# Patient Record
Sex: Female | Born: 1940
Health system: Southern US, Community
[De-identification: ages and names within clinical notes are randomized; demographics above are authoritative.]

## PROBLEM LIST (undated history)

## (undated) DIAGNOSIS — N390 Urinary tract infection, site not specified: Secondary | ICD-10-CM

## (undated) DIAGNOSIS — R5383 Other fatigue: Secondary | ICD-10-CM

## (undated) DIAGNOSIS — E785 Hyperlipidemia, unspecified: Secondary | ICD-10-CM

## (undated) DIAGNOSIS — IMO0001 Reserved for inherently not codable concepts without codable children: Secondary | ICD-10-CM

## (undated) DIAGNOSIS — E119 Type 2 diabetes mellitus without complications: Secondary | ICD-10-CM

## (undated) DIAGNOSIS — I809 Phlebitis and thrombophlebitis of unspecified site: Secondary | ICD-10-CM

## (undated) DIAGNOSIS — N952 Postmenopausal atrophic vaginitis: Secondary | ICD-10-CM

## (undated) DIAGNOSIS — J449 Chronic obstructive pulmonary disease, unspecified: Secondary | ICD-10-CM

## (undated) DIAGNOSIS — J020 Streptococcal pharyngitis: Secondary | ICD-10-CM

## (undated) DIAGNOSIS — R87629 Unspecified abnormal cytological findings in specimens from vagina: Secondary | ICD-10-CM

## (undated) DIAGNOSIS — R42 Dizziness and giddiness: Secondary | ICD-10-CM

## (undated) HISTORY — PX: GLAUCOMA SURGERY: SHX656

## (undated) HISTORY — DX: Unspecified abnormal cytological findings in specimens from vagina: R87.629

## (undated) HISTORY — DX: Other fatigue: R53.83

## (undated) HISTORY — DX: Urinary tract infection, site not specified: N39.0

## (undated) HISTORY — DX: Type 2 diabetes mellitus without complications: E11.9

## (undated) HISTORY — DX: Dizziness and giddiness: R42

## (undated) HISTORY — DX: Chronic obstructive pulmonary disease, unspecified: J44.9

## (undated) HISTORY — DX: Streptococcal pharyngitis: J02.0

## (undated) HISTORY — DX: Postmenopausal atrophic vaginitis: N95.2

## (undated) HISTORY — PX: CERVICAL CONE BIOPSY: SUR198

## (undated) HISTORY — DX: Phlebitis and thrombophlebitis of unspecified site: I80.9

## (undated) HISTORY — DX: Reserved for inherently not codable concepts without codable children: IMO0001

## (undated) HISTORY — DX: Hyperlipidemia, unspecified: E78.5

## (undated) HISTORY — PX: VARICOSE VEIN SURGERY: SHX832

---

## 1999-01-10 ENCOUNTER — Encounter: Payer: Self-pay | Admitting: Vascular Surgery

## 1999-01-14 ENCOUNTER — Ambulatory Visit (HOSPITAL_COMMUNITY): Admission: RE | Admit: 1999-01-14 | Discharge: 1999-01-14 | Payer: Self-pay | Admitting: Vascular Surgery

## 1999-01-14 ENCOUNTER — Encounter (INDEPENDENT_AMBULATORY_CARE_PROVIDER_SITE_OTHER): Payer: Self-pay

## 2000-11-22 ENCOUNTER — Other Ambulatory Visit: Admission: RE | Admit: 2000-11-22 | Discharge: 2000-11-22 | Payer: Self-pay | Admitting: Specialist

## 2003-02-05 ENCOUNTER — Other Ambulatory Visit: Admission: RE | Admit: 2003-02-05 | Discharge: 2003-02-05 | Payer: Self-pay | Admitting: Family Medicine

## 2004-02-08 ENCOUNTER — Other Ambulatory Visit: Admission: RE | Admit: 2004-02-08 | Discharge: 2004-02-08 | Payer: Self-pay | Admitting: Family Medicine

## 2008-06-22 DIAGNOSIS — J449 Chronic obstructive pulmonary disease, unspecified: Secondary | ICD-10-CM

## 2008-06-22 HISTORY — DX: Chronic obstructive pulmonary disease, unspecified: J44.9

## 2009-01-13 HISTORY — PX: OTHER SURGICAL HISTORY: SHX169

## 2009-01-17 ENCOUNTER — Ambulatory Visit (HOSPITAL_COMMUNITY): Admission: RE | Admit: 2009-01-17 | Discharge: 2009-01-17 | Payer: Self-pay | Admitting: Ophthalmology

## 2009-01-29 HISTORY — PX: OTHER SURGICAL HISTORY: SHX169

## 2009-02-07 ENCOUNTER — Ambulatory Visit (HOSPITAL_COMMUNITY): Admission: RE | Admit: 2009-02-07 | Discharge: 2009-02-07 | Payer: Self-pay | Admitting: Ophthalmology

## 2010-09-28 LAB — BASIC METABOLIC PANEL
BUN: 16 mg/dL (ref 6–23)
CO2: 29 mEq/L (ref 19–32)
Calcium: 9.5 mg/dL (ref 8.4–10.5)
Chloride: 109 mEq/L (ref 96–112)
Creatinine, Ser: 0.8 mg/dL (ref 0.4–1.2)
GFR calc Af Amer: >60 mL/min (ref >60)
GFR calc non Af Amer: >60 mL/min (ref >60)
Glucose, Bld: 107 mg/dL — ABNORMAL HIGH (ref 70–99)
Potassium: 4.6 mEq/L (ref 3.5–5.1)
Sodium: 142 mEq/L (ref 135–145)

## 2010-09-28 LAB — HEMOGLOBIN AND HEMATOCRIT, BLOOD
HCT: 37 % (ref 36.0–46.0)
Hemoglobin: 12.8 g/dL (ref 12.0–15.0)

## 2010-10-21 ENCOUNTER — Ambulatory Visit (INDEPENDENT_AMBULATORY_CARE_PROVIDER_SITE_OTHER): Payer: Medicare Other | Admitting: Urology

## 2010-10-21 DIAGNOSIS — N952 Postmenopausal atrophic vaginitis: Secondary | ICD-10-CM

## 2010-10-21 DIAGNOSIS — N3000 Acute cystitis without hematuria: Secondary | ICD-10-CM

## 2010-10-21 DIAGNOSIS — R3915 Urgency of urination: Secondary | ICD-10-CM

## 2010-10-21 DIAGNOSIS — N3941 Urge incontinence: Secondary | ICD-10-CM

## 2010-11-11 ENCOUNTER — Encounter: Payer: Self-pay | Admitting: Nurse Practitioner

## 2012-09-30 ENCOUNTER — Ambulatory Visit (INDEPENDENT_AMBULATORY_CARE_PROVIDER_SITE_OTHER): Payer: Medicare Other | Admitting: Nurse Practitioner

## 2012-09-30 ENCOUNTER — Encounter: Payer: Self-pay | Admitting: Nurse Practitioner

## 2012-09-30 VITALS — BP 137/77 | HR 50 | Temp 96.8°F | Ht 67.5 in | Wt 148.5 lb

## 2012-09-30 DIAGNOSIS — H409 Unspecified glaucoma: Secondary | ICD-10-CM

## 2012-09-30 DIAGNOSIS — E559 Vitamin D deficiency, unspecified: Secondary | ICD-10-CM

## 2012-09-30 DIAGNOSIS — E785 Hyperlipidemia, unspecified: Secondary | ICD-10-CM

## 2012-09-30 DIAGNOSIS — I10 Essential (primary) hypertension: Secondary | ICD-10-CM | POA: Insufficient documentation

## 2012-09-30 LAB — COMPLETE METABOLIC PANEL WITH GFR
ALT: 12 U/L (ref 0–35)
BUN: 11 mg/dL (ref 6–23)
CO2: 28 mEq/L (ref 19–32)
Creat: 0.72 mg/dL (ref 0.50–1.10)
GFR, Est African American: >89 mL/min
GFR, Est Non African American: 85 mL/min
Total Bilirubin: 0.4 mg/dL (ref 0.3–1.2)

## 2012-09-30 NOTE — Progress Notes (Signed)
Subjective:    Patient ID: Priscilla Daniels, female    DOB: 09-20-1940, 71 y.o.   MRN: [id number]  Hyperlipidemia This is a chronic problem. The current episode started more than 1 year ago. The problem is controlled. Recent lipid tests were reviewed and are normal. She has no history of diabetes, hypothyroidism or obesity. There are no known factors aggravating her hyperlipidemia. Pertinent negatives include no chest pain, leg pain or shortness of breath. Current antihyperlipidemic treatment includes statins. The current treatment provides no improvement of lipids. Compliance problems include adherence to diet.  Risk factors for coronary artery disease include dyslipidemia, family history, hypertension and post-menopausal.  Hypertension This is a chronic problem. The current episode started more than 1 year ago. The problem has been resolved since onset. The problem is controlled. Associated symptoms include blurred vision. Pertinent negatives include no anxiety, chest pain, palpitations, peripheral edema or shortness of breath. There are no associated agents to hypertension. Risk factors for coronary artery disease include dyslipidemia, family history and post-menopausal state. Past treatments include ACE inhibitors. The current treatment provides no improvement. Compliance problems include exercise.  There is no history of kidney disease or a thyroid problem. There is no history of sleep apnea.      Review of Systems  Constitutional: Negative.   HENT: Negative.   Eyes: Positive for blurred vision.  Respiratory: Negative.  Negative for cough and shortness of breath.   Cardiovascular: Negative.  Negative for chest pain and palpitations.  Gastrointestinal: Negative.   Endocrine: Negative.   Genitourinary:       Pt states she "feels pressure" and doesn't feel like she urinating enough  Musculoskeletal: Negative.   Skin: Negative.   Allergic/Immunologic: Positive for environmental allergies.   Hematological: Negative.   Psychiatric/Behavioral: Negative.        Objective:   Physical Exam  Constitutional: She is oriented to person, place, and time. She appears well-developed and well-nourished.  HENT:  Nose: Nose normal.  Mouth/Throat: Oropharynx is clear and moist.  Eyes: EOM are normal.  Neck: Trachea normal, normal range of motion and full passive range of motion without pain. Neck supple. No JVD present. Carotid bruit is not present. No thyromegaly present.  Cardiovascular: Normal rate, regular rhythm, normal heart sounds and intact distal pulses.  Exam reveals no gallop and no friction rub.   No murmur heard. Pulmonary/Chest: Effort normal and breath sounds normal.  Abdominal: Soft. Bowel sounds are normal. She exhibits no distension and no mass. There is no tenderness.  Musculoskeletal: Normal range of motion.  Lymphadenopathy:    She has no cervical adenopathy.  Neurological: She is alert and oriented to person, place, and time. She has normal reflexes.  Skin: Skin is warm and dry.  Psychiatric: She has a normal mood and affect. Her behavior is normal. Judgment and thought content normal.     BP 137/77  Pulse 50  Temp(Src) 96.8 F (36 C) (Oral)  Ht 5' 7.5" (1.715 m)  Wt 148 lb 8 oz (67.359 kg)  BMI 22.9 kg/m2      Assessment & Plan:  1. Glaucoma (increased eye pressure) -Keep f/u with Eye dr -Continue Meds  2. Other and unspecified hyperlipidemia -Encourage low fat diet - NMR Lipoprofile with Lipids  3. Unspecified essential hypertension -Encourage Exercise - COMPLETE METABOLIC PANEL WITH GFR  4. Unspecified vitamin D deficiency -Labs pending - Vitamin D 25 hydroxy  Health Maintenance Discussed and Hemocult cards given Labs Pending Continue all current meds  Mary-Margaret  Hassell Done, [place]

## 2012-09-30 NOTE — Patient Instructions (Signed)

## 2012-10-01 LAB — VITAMIN D 25 HYDROXY (VIT D DEFICIENCY, FRACTURES): Vit D, 25-Hydroxy: 26 ng/mL — ABNORMAL LOW (ref 30–89)

## 2012-10-04 LAB — NMR LIPOPROFILE WITH LIPIDS
HDL Size: 10.2 nm (ref >=9.2)
HDL-C: 75 mg/dL (ref >=40)
LDL (calc): 96 mg/dL (ref <100)
LDL Particle Number: 1278 nmol/L — ABNORMAL HIGH (ref <1000)
LDL Size: 21.4 nm (ref >20.5)
LP-IR Score: <25 (ref <=45)
VLDL Size: 39.5 nm (ref <=46.6)

## 2012-10-17 ENCOUNTER — Telehealth: Payer: Self-pay | Admitting: Nurse Practitioner

## 2012-10-17 NOTE — Telephone Encounter (Signed)
i cant see where labs were reviewed on the 09/30/12 can you please check and send back to pool be

## 2012-10-17 NOTE — Telephone Encounter (Signed)
Patient aware of labs.  

## 2012-10-17 NOTE — Telephone Encounter (Signed)
Let patient know all labs looked OK . Continue current meds Recheck in 3 months

## 2012-11-09 ENCOUNTER — Telehealth: Payer: Self-pay | Admitting: Nurse Practitioner

## 2012-11-09 NOTE — Telephone Encounter (Signed)
Copy of labs sent to pt. 

## 2012-11-09 NOTE — Telephone Encounter (Signed)
Will you please review labs from 09/2012 so we can call and send pt a copy

## 2012-11-09 NOTE — Telephone Encounter (Signed)
AL labs look ok- Continue current meds and recheck in 3 months

## 2012-11-25 ENCOUNTER — Ambulatory Visit (INDEPENDENT_AMBULATORY_CARE_PROVIDER_SITE_OTHER): Payer: Medicare Other | Admitting: Physician Assistant

## 2012-11-25 VITALS — BP 150/80 | HR 78 | Temp 97.9°F | Ht 67.5 in | Wt 148.0 lb

## 2012-11-25 DIAGNOSIS — W57XXXA Bitten or stung by nonvenomous insect and other nonvenomous arthropods, initial encounter: Secondary | ICD-10-CM

## 2012-11-25 DIAGNOSIS — S30861A Insect bite (nonvenomous) of abdominal wall, initial encounter: Secondary | ICD-10-CM

## 2012-11-25 DIAGNOSIS — S40269A Insect bite (nonvenomous) of unspecified shoulder, initial encounter: Secondary | ICD-10-CM

## 2012-11-25 DIAGNOSIS — S40862A Insect bite (nonvenomous) of left upper arm, initial encounter: Secondary | ICD-10-CM

## 2012-11-25 MED ORDER — DOXYCYCLINE HYCLATE 100 MG PO TABS: 100.0000 mg | ORAL_TABLET | Freq: Two times a day (BID) | ORAL | Status: DC

## 2012-11-25 NOTE — Patient Instructions (Addendum)
Take antibiotic with food. Do not take on empty stomach. Finish all of antibiotic. Clean area daily with soap and water and cover with Vaseline. If patient develops rash, headache, nausea, vomiting, return to clinic.

## 2012-11-25 NOTE — Progress Notes (Signed)
  Subjective:    Patient ID: Priscilla Daniels, female    DOB: 1940/09/25, 72 y.o.   MRN: [id number]  HPI 72 y/o female was bitten by a tick yesterday and was unable to remove all of it.     Review of Systems  Constitutional: Negative for fever, chills, diaphoresis, appetite change and fatigue.  Respiratory: Negative for shortness of breath.   Cardiovascular: Negative for chest pain and palpitations.  Skin: Negative for color change, pallor, rash and wound.  Neurological: Negative for dizziness, light-headedness, numbness and headaches.       Objective:   Physical Exam  Constitutional: She appears well-developed and well-nourished.  HENT:  Head: Normocephalic.  Eyes: Pupils are equal, round, and reactive to light.  Abdominal: Soft. There is no tenderness. There is no rebound and no guarding.  Lymphadenopathy:    She has no cervical adenopathy.  Skin: No rash noted. She is not diaphoretic. There is erythema (surrounding tick bite). No pallor.  Psychiatric: She has a normal mood and affect. Her behavior is normal.          Assessment & Plan:  Remainder of tick was removed from abdomen. No further reminants noted on exam. Lesion of bite is erythematous but does not appear to be infected at this time. Due to patient concerns about RMSF and Lymes Disease, I have decided to treat prophylacticly with Doxycycline 100 Mg BID x 14 days. I instructed her of s/s to watch for including rash, n/v, HA, fever and RTC if any of these symptoms present.

## 2013-01-24 ENCOUNTER — Ambulatory Visit (INDEPENDENT_AMBULATORY_CARE_PROVIDER_SITE_OTHER): Payer: Medicare Other | Admitting: Nurse Practitioner

## 2013-01-24 ENCOUNTER — Ambulatory Visit (INDEPENDENT_AMBULATORY_CARE_PROVIDER_SITE_OTHER): Payer: Medicare Other

## 2013-01-24 ENCOUNTER — Encounter: Payer: Self-pay | Admitting: Nurse Practitioner

## 2013-01-24 VITALS — BP 136/77 | HR 59 | Temp 96.9°F | Ht 67.5 in | Wt 138.0 lb

## 2013-01-24 DIAGNOSIS — R634 Abnormal weight loss: Secondary | ICD-10-CM

## 2013-01-24 DIAGNOSIS — E785 Hyperlipidemia, unspecified: Secondary | ICD-10-CM

## 2013-01-24 DIAGNOSIS — R34 Anuria and oliguria: Secondary | ICD-10-CM

## 2013-01-24 DIAGNOSIS — I10 Essential (primary) hypertension: Secondary | ICD-10-CM

## 2013-01-24 LAB — POCT UA - MICROSCOPIC ONLY
Bacteria, U Microscopic: NEGATIVE
Casts, Ur, LPF, POC: NEGATIVE
Crystals, Ur, HPF, POC: NEGATIVE
Mucus, UA: NEGATIVE
Yeast, UA: NEGATIVE

## 2013-01-24 LAB — POCT URINALYSIS DIPSTICK
Bilirubin, UA: NEGATIVE
Glucose, UA: NEGATIVE
Ketones, UA: NEGATIVE
Nitrite, UA: NEGATIVE
Protein, UA: NEGATIVE
Spec Grav, UA: >=1.03
Urobilinogen, UA: NEGATIVE
pH, UA: 5

## 2013-01-24 IMAGING — CR DG CHEST 2V
2 series · 2 of 2 positions shown · non-contrast
Comparison: None.

CLINICAL DATA: Weight loss

CHEST - 2 VIEW

[view not recorded (1 of 2)]
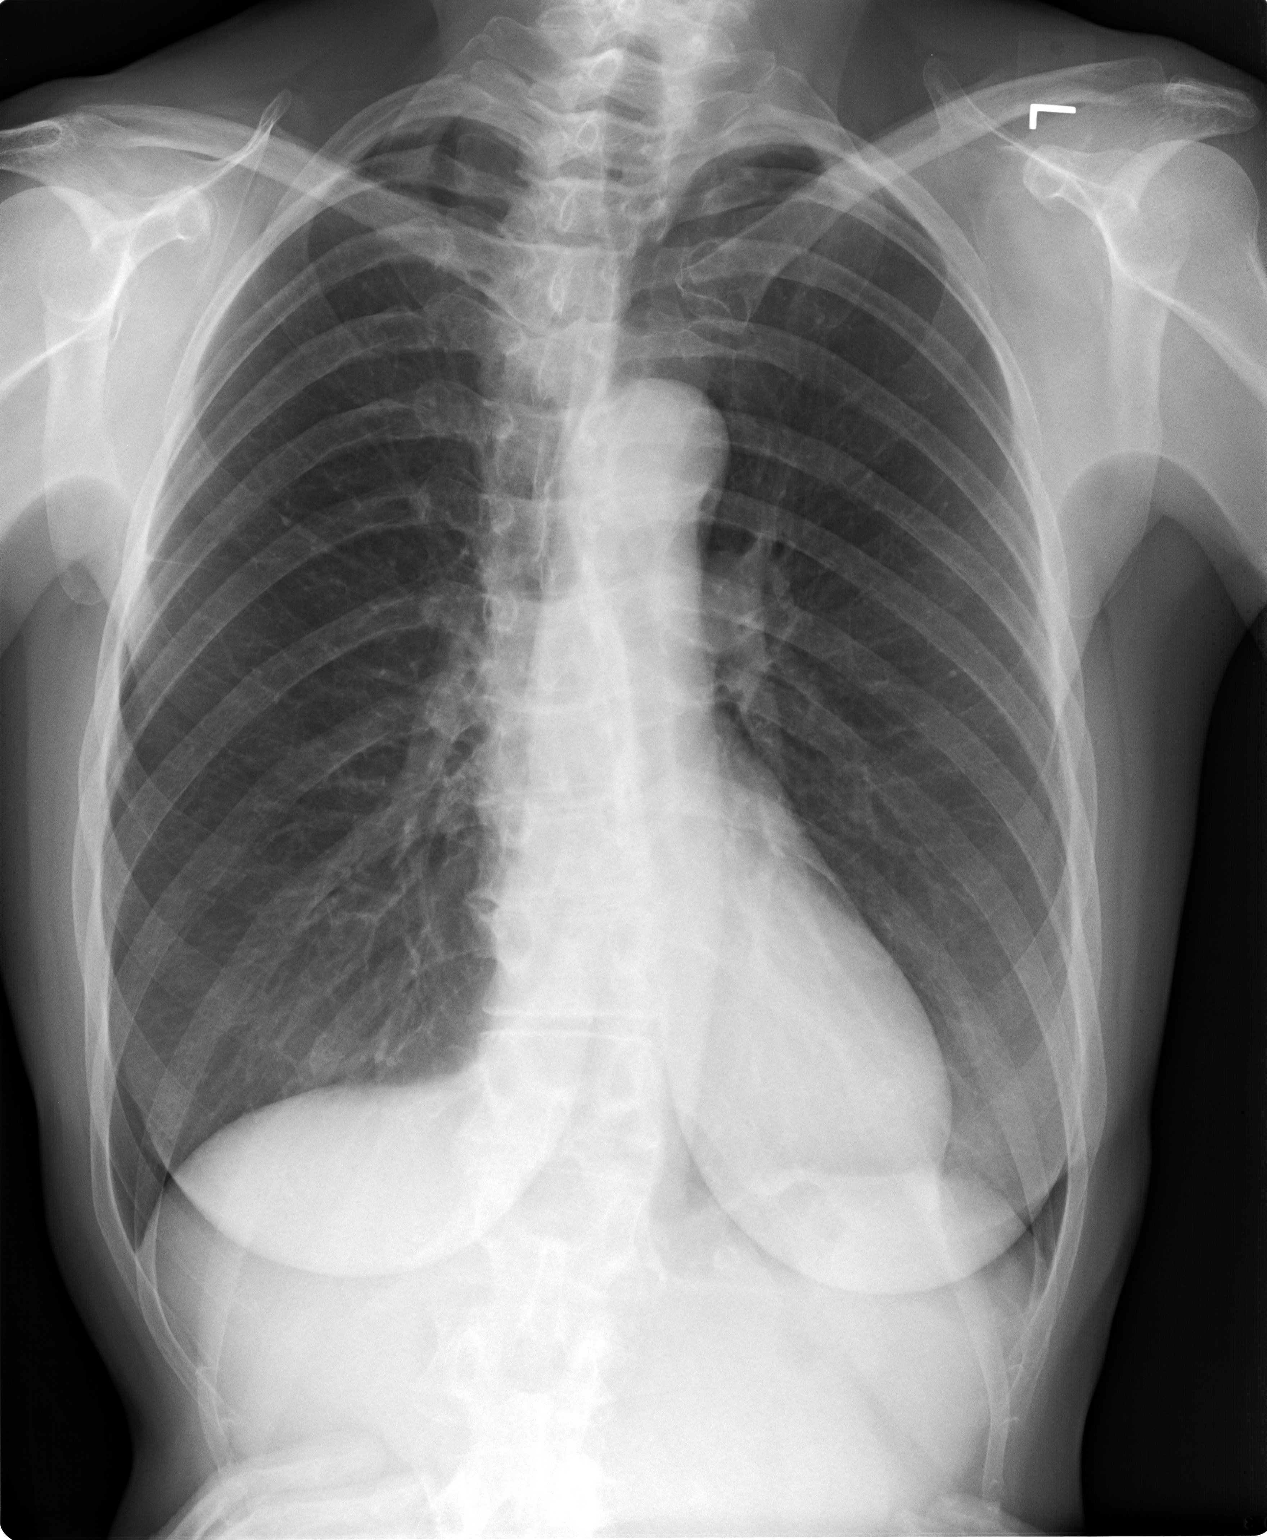

[view not recorded (2 of 2)]
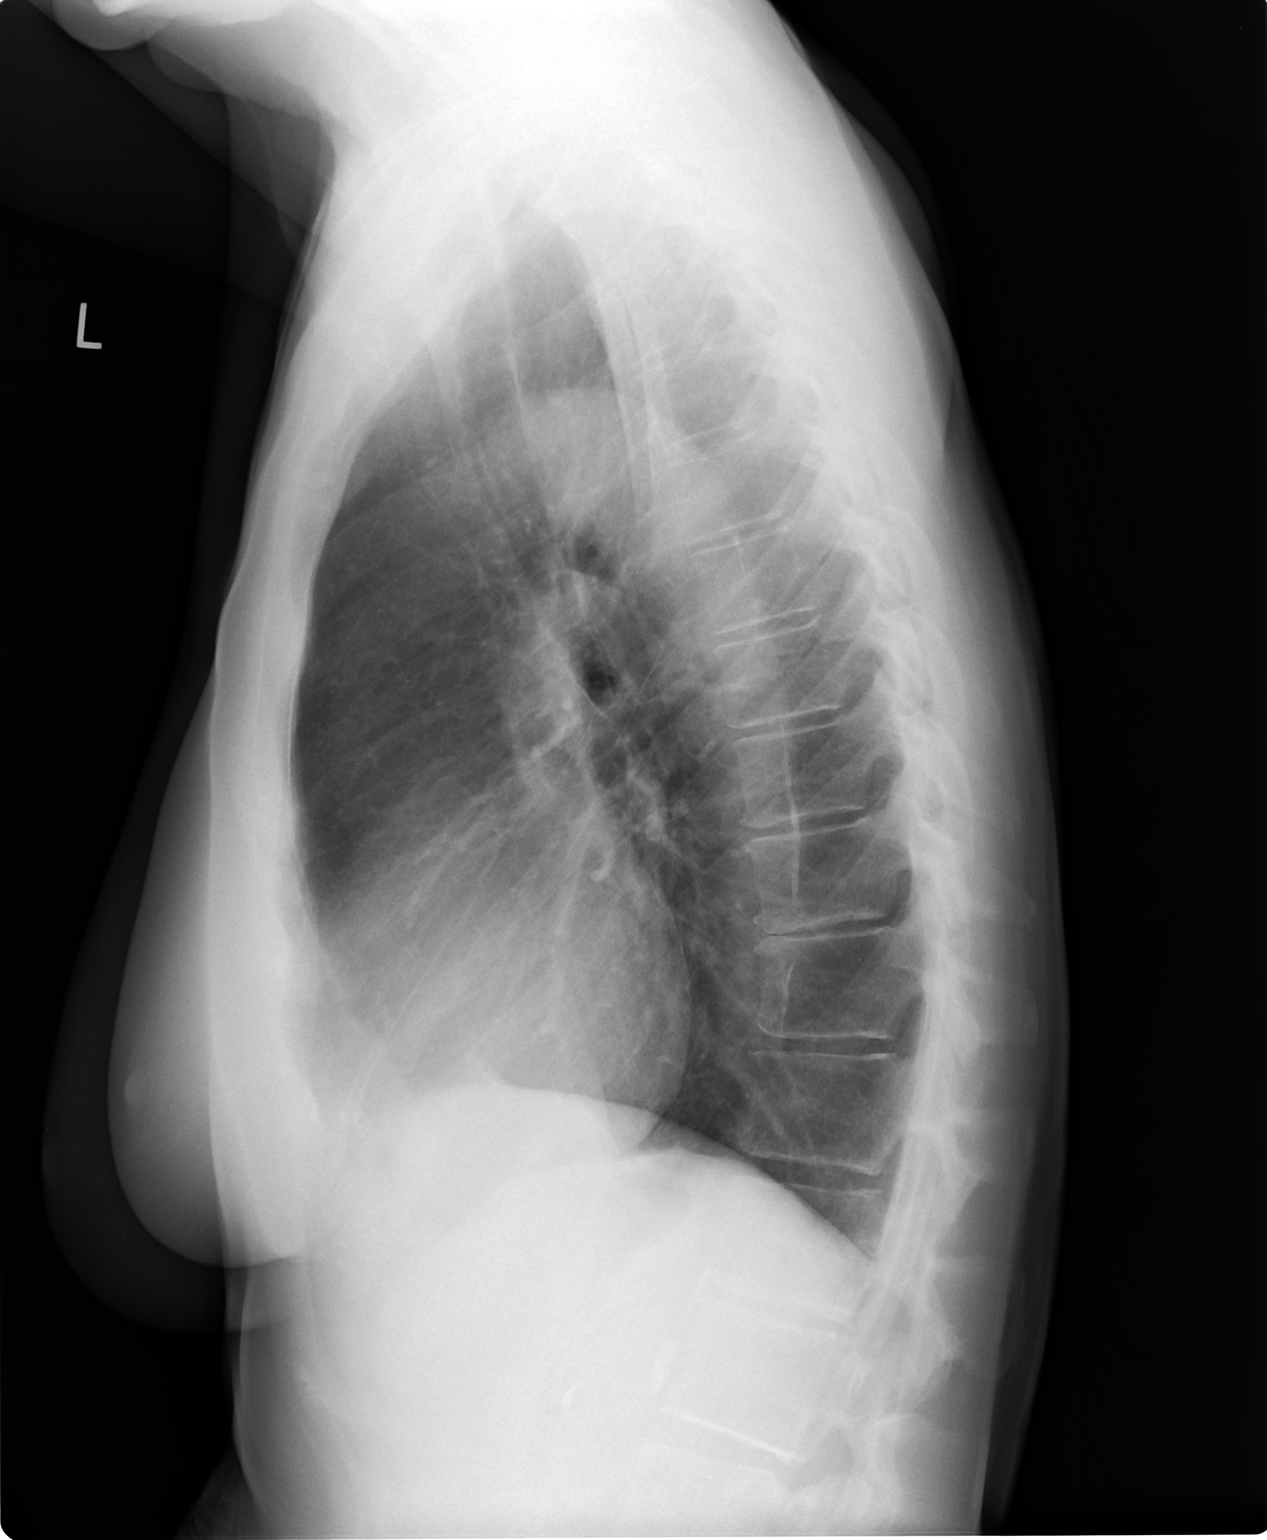

[2 of 2 positions shown; findings below may reference images not displayed]

FINDINGS: Cardiomediastinal silhouette is unremarkable.  Mild
thoracic dextroscoliosis.  Mild degenerative changes lower thoracic
spine.  No acute infiltrate or pulmonary edema.
IMPRESSION: No active disease.  Mild thoracic dextroscoliosis.  Mild
degenerative changes lower thoracic spine.

Clinically significant discrepancy from primary report, if
provided: None

## 2013-01-24 MED ORDER — LISINOPRIL-HYDROCHLOROTHIAZIDE 20-12.5 MG PO TABS: 1.0000 | ORAL_TABLET | Freq: Every day | ORAL | Status: DC

## 2013-01-24 MED ORDER — ROSUVASTATIN CALCIUM 10 MG PO TABS: 10.0000 mg | ORAL_TABLET | Freq: Every day | ORAL | Status: DC

## 2013-01-24 NOTE — Progress Notes (Signed)
Subjective:    Patient ID: Priscilla Daniels, female    DOB: 03-15-1941, 72 y.o.   MRN: [id number]  Hypertension This is a chronic problem. The current episode started more than 1 year ago. The problem is controlled. Pertinent negatives include no anxiety, blurred vision, palpitations or peripheral edema. Risk factors for coronary artery disease include dyslipidemia, post-menopausal state and family history. Past treatments include diuretics and ACE inhibitors. The current treatment provides moderate improvement. There is no history of a thyroid problem. There is no history of sleep apnea.  Hyperlipidemia This is a chronic problem. The current episode started more than 1 year ago. The problem is controlled. Recent lipid tests were reviewed and are normal. She has no history of diabetes. Associated symptoms include leg pain. Pertinent negatives include no myalgias. Current antihyperlipidemic treatment includes statins. The current treatment provides significant improvement of lipids. Risk factors for coronary artery disease include post-menopausal, hypertension and family history.  Urinary Tract Infection  This is a recurrent problem. The current episode started 1 to 4 weeks ago (about two weeks ago). The problem occurs intermittently. The problem has been unchanged. The quality of the pain is described as burning. The pain is at a severity of 2/10. The pain is mild. There has been no fever. Associated symptoms include frequency and urgency.      Review of Systems  Eyes: Negative for blurred vision.  Cardiovascular: Negative for palpitations.  Genitourinary: Positive for urgency and frequency.  Musculoskeletal: Negative for myalgias.  All other systems reviewed and are negative.       Objective:   Physical Exam  Vitals reviewed. Constitutional: She is oriented to person, place, and time. She appears well-developed and well-nourished.  HENT:  Head: Normocephalic.  Right Ear: External ear  normal.  Left Ear: External ear normal.  Mouth/Throat: Oropharynx is clear and moist.  Eyes: Pupils are equal, round, and reactive to light.  Neck: Normal range of motion. Neck supple. No thyromegaly present.  Cardiovascular: Normal rate, regular rhythm, normal heart sounds and intact distal pulses.   Pulmonary/Chest: Effort normal and breath sounds normal. No respiratory distress.  Abdominal: Soft. Bowel sounds are normal. She exhibits no distension. There is no tenderness.  Musculoskeletal: Normal range of motion. She exhibits no edema.  Neurological: She is alert and oriented to person, place, and time.  Skin: Skin is warm and dry.  Psychiatric: She has a normal mood and affect. Her behavior is normal. Judgment and thought content normal.     BP 136/77  Pulse 59  Temp(Src) 96.9 F (36.1 C) (Oral)  Ht 5' 7.5" (1.715 m)  Wt 138 lb (62.596 kg)  BMI 21.28 kg/m2 Results for orders placed in visit on 01/24/13  POCT UA - MICROSCOPIC ONLY      Result Value Range   WBC, Ur, HPF, POC 5-10     RBC, urine, microscopic 1-5     Bacteria, U Microscopic neg     Mucus, UA neg     Epithelial cells, urine per micros occ     Crystals, Ur, HPF, POC neg     Casts, Ur, LPF, POC neg     Yeast, UA neg    POCT URINALYSIS DIPSTICK      Result Value Range   Color, UA yellow     Clarity, UA clear     Glucose, UA neg     Bilirubin, UA neg     Ketones, UA neg     Spec Grav, UA >=1.030  Blood, UA trace     pH, UA 5.0     Protein, UA neg     Urobilinogen, UA negative     Nitrite, UA neg     Leukocytes, UA Trace     Chest xray- No acute findings-Preliminary reading by Paulene Floor, FNP  [facility]     Assessment & Plan:  1. Urine output low Force fluids - POCT UA - Microscopic Only - POCT urinalysis dipstick  2. Hypertension Low Na+ diet - lisinopril-hydrochlorothiazide (PRINZIDE,ZESTORETIC) 20-12.5 MG per tablet; Take 1 tablet by mouth daily.  Dispense: 30 tablet; Refill: 5 -  CMP14+EGFR  3. Hyperlipidemia Low fat diet - rosuvastatin (CRESTOR) 10 MG tablet; Take 1 tablet (10 mg total) by mouth daily.  Dispense: 30 tablet; Refill: 5 - NMR, lipoprofile  4. Weight loss Add boost or ensure to daily diet If weight continues to go down then need to be seen for pelvic exam  Health Maintenance discussed  Mary-Margaret Daphine Deutscher, FNP

## 2013-01-24 NOTE — Patient Instructions (Addendum)

## 2013-01-26 ENCOUNTER — Other Ambulatory Visit: Payer: Self-pay | Admitting: Nurse Practitioner

## 2013-01-26 LAB — CMP14+EGFR
ALT: 14 IU/L (ref 0–32)
AST: 19 IU/L (ref 0–40)
Albumin/Globulin Ratio: 1.7 (ref 1.1–2.5)
Alkaline Phosphatase: 55 IU/L (ref 39–117)
BUN/Creatinine Ratio: 17 (ref 11–26)
GFR calc Af Amer: 84 mL/min/{1.73_m2} (ref >59)
GFR calc non Af Amer: 73 mL/min/{1.73_m2} (ref >59)
Potassium: 5.5 mmol/L — ABNORMAL HIGH (ref 3.5–5.2)
Sodium: 141 mmol/L (ref 134–144)
Total Bilirubin: 0.3 mg/dL (ref 0.0–1.2)
Total Protein: 6.8 g/dL (ref 6.0–8.5)

## 2013-01-26 LAB — NMR, LIPOPROFILE
LDL Size: 21.3 nm (ref >20.5)
LP-IR Score: <25 (ref <=45)
Small LDL Particle Number: 150 nmol/L (ref <=527)
Triglycerides by NMR: 108 mg/dL (ref <150)

## 2013-01-26 MED ORDER — ROSUVASTATIN CALCIUM 20 MG PO TABS: 20.0000 mg | ORAL_TABLET | Freq: Every day | ORAL | Status: DC

## 2013-03-27 ENCOUNTER — Ambulatory Visit (INDEPENDENT_AMBULATORY_CARE_PROVIDER_SITE_OTHER): Payer: Medicare Other

## 2013-03-27 DIAGNOSIS — Z23 Encounter for immunization: Secondary | ICD-10-CM

## 2013-05-01 ENCOUNTER — Ambulatory Visit (INDEPENDENT_AMBULATORY_CARE_PROVIDER_SITE_OTHER): Payer: Medicare Other | Admitting: Nurse Practitioner

## 2013-05-01 ENCOUNTER — Encounter: Payer: Self-pay | Admitting: Nurse Practitioner

## 2013-05-01 VITALS — BP 142/84 | HR 66 | Temp 96.8°F | Ht 67.5 in | Wt 138.0 lb

## 2013-05-01 DIAGNOSIS — I10 Essential (primary) hypertension: Secondary | ICD-10-CM

## 2013-05-01 DIAGNOSIS — E785 Hyperlipidemia, unspecified: Secondary | ICD-10-CM

## 2013-05-01 DIAGNOSIS — R350 Frequency of micturition: Secondary | ICD-10-CM

## 2013-05-01 DIAGNOSIS — H409 Unspecified glaucoma: Secondary | ICD-10-CM

## 2013-05-01 LAB — POCT UA - MICROSCOPIC ONLY
Crystals, Ur, HPF, POC: NEGATIVE
Yeast, UA: NEGATIVE

## 2013-05-01 LAB — POCT URINALYSIS DIPSTICK
Protein, UA: NEGATIVE
Spec Grav, UA: <=1.005
Urobilinogen, UA: NEGATIVE

## 2013-05-01 MED ORDER — ROSUVASTATIN CALCIUM 20 MG PO TABS: 20.0000 mg | ORAL_TABLET | Freq: Every day | ORAL | Status: DC

## 2013-05-01 MED ORDER — LISINOPRIL-HYDROCHLOROTHIAZIDE 20-12.5 MG PO TABS: 1.0000 | ORAL_TABLET | Freq: Every day | ORAL | Status: DC

## 2013-05-01 NOTE — Patient Instructions (Signed)

## 2013-05-01 NOTE — Progress Notes (Signed)
Subjective:    Patient ID: Priscilla Daniels, female    DOB: 1940/11/12, 72 y.o.   MRN: [id number]  Hyperlipidemia This is a chronic problem. The current episode started more than 1 year ago. The problem is controlled. Recent lipid tests were reviewed and are normal. She has no history of diabetes, hypothyroidism or obesity. There are no known factors aggravating her hyperlipidemia. Pertinent negatives include no chest pain, leg pain or shortness of breath. Current antihyperlipidemic treatment includes statins. The current treatment provides no improvement of lipids. Compliance problems include adherence to diet.  Risk factors for coronary artery disease include dyslipidemia, family history, hypertension and post-menopausal.  Hypertension This is a chronic problem. The current episode started more than 1 year ago. The problem has been resolved since onset. The problem is controlled. Associated symptoms include blurred vision. Pertinent negatives include no anxiety, chest pain, palpitations, peripheral edema or shortness of breath. There are no associated agents to hypertension. Risk factors for coronary artery disease include dyslipidemia, family history and post-menopausal state. Past treatments include ACE inhibitors. The current treatment provides no improvement. Compliance problems include exercise.  There is no history of kidney disease or a thyroid problem. There is no history of sleep apnea.  Glaucoma Pressure currently under control- says that dr. Not sure glaucoma- but a pressure behind her eye.  * C/o urinary frequency and urgency- started about 1 week ago    Review of Systems  Constitutional: Negative.   HENT: Negative.   Eyes: Positive for blurred vision.  Respiratory: Negative.  Negative for cough and shortness of breath.   Cardiovascular: Negative.  Negative for chest pain and palpitations.  Gastrointestinal: Negative.   Endocrine: Negative.   Genitourinary:       Pt states she  "feels pressure" and doesn't feel like she urinating enough  Musculoskeletal: Negative.   Skin: Negative.   Allergic/Immunologic: Positive for environmental allergies.  Hematological: Negative.   Psychiatric/Behavioral: Negative.        Objective:   Physical Exam  Constitutional: She is oriented to person, place, and time. She appears well-developed and well-nourished.  HENT:  Nose: Nose normal.  Mouth/Throat: Oropharynx is clear and moist.  Eyes: EOM are normal.  Neck: Trachea normal, normal range of motion and full passive range of motion without pain. Neck supple. No JVD present. Carotid bruit is not present. No thyromegaly present.  Cardiovascular: Normal rate, regular rhythm, normal heart sounds and intact distal pulses.  Exam reveals no gallop and no friction rub.   No murmur heard. Pulmonary/Chest: Effort normal and breath sounds normal.  Abdominal: Soft. Bowel sounds are normal. She exhibits no distension and no mass. There is no tenderness.  Musculoskeletal: Normal range of motion.  Lymphadenopathy:    She has no cervical adenopathy.  Neurological: She is alert and oriented to person, place, and time. She has normal reflexes.  Skin: Skin is warm and dry.  Psychiatric: She has a normal mood and affect. Her behavior is normal. Judgment and thought content normal.     BP 142/84  Pulse 66  Temp(Src) 96.8 F (36 C) (Oral)  Ht 5' 7.5" (1.715 m)  Wt 138 lb (62.596 kg)  BMI 21.28 kg/m2 Urine negative     Assessment & Plan:   1. Urinary frequency   2. Unspecified essential hypertension   3. Other and unspecified hyperlipidemia   4. Glaucoma (increased eye pressure)    Orders Placed This Encounter  Procedures  . CMP14+EGFR  . NMR, lipoprofile  .  POCT urinalysis dipstick  . POCT UA - Microscopic Only   Meds ordered this encounter  Medications  . rosuvastatin (CRESTOR) 20 MG tablet    Sig: Take 1 tablet (20 mg total) by mouth daily.    Dispense:  30 tablet     Refill:  5    Order Specific Question:  Supervising Provider    Answer:  Ernestina Penna [[id number]]  . lisinopril-hydrochlorothiazide (PRINZIDE,ZESTORETIC) 20-12.5 MG per tablet    Sig: Take 1 tablet by mouth daily.    Dispense:  30 tablet    Refill:  5    Order Specific Question:  Supervising Provider    Answer:  Deborra Medina    Continue all meds Labs pending Diet and exercise encouraged Health maintenance reviewed Follow up in 3 months  Mary-Margaret Daphine Deutscher, FNP

## 2013-05-02 LAB — CMP14+EGFR
ALT: 10 IU/L (ref 0–32)
Albumin/Globulin Ratio: 1.5 (ref 1.1–2.5)
Albumin: 4 g/dL (ref 3.5–4.8)
Alkaline Phosphatase: 59 IU/L (ref 39–117)
BUN/Creatinine Ratio: 24 (ref 11–26)
Calcium: 9.4 mg/dL (ref 8.6–10.2)
Creatinine, Ser: 0.7 mg/dL (ref 0.57–1.00)
GFR calc Af Amer: 100 mL/min/{1.73_m2} (ref >59)
GFR calc non Af Amer: 87 mL/min/{1.73_m2} (ref >59)
Globulin, Total: 2.7 g/dL (ref 1.5–4.5)
Glucose: 114 mg/dL — ABNORMAL HIGH (ref 65–99)
Potassium: 4.6 mmol/L (ref 3.5–5.2)
Total Bilirubin: 0.3 mg/dL (ref 0.0–1.2)
Total Protein: 6.7 g/dL (ref 6.0–8.5)

## 2013-05-02 LAB — LIPID PANEL
Chol/HDL Ratio: 2.6 ratio units (ref 0.0–4.4)
HDL: 84 mg/dL (ref >39)
LDL Calculated: 111 mg/dL — ABNORMAL HIGH (ref 0–99)
VLDL Cholesterol Cal: 26 mg/dL (ref 5–40)

## 2013-08-02 ENCOUNTER — Ambulatory Visit (INDEPENDENT_AMBULATORY_CARE_PROVIDER_SITE_OTHER): Payer: Medicare Other | Admitting: Nurse Practitioner

## 2013-08-02 ENCOUNTER — Encounter: Payer: Self-pay | Admitting: Nurse Practitioner

## 2013-08-02 VITALS — BP 158/90 | HR 68 | Temp 97.0°F | Ht 67.0 in | Wt 140.0 lb

## 2013-08-02 DIAGNOSIS — E119 Type 2 diabetes mellitus without complications: Secondary | ICD-10-CM

## 2013-08-02 DIAGNOSIS — E785 Hyperlipidemia, unspecified: Secondary | ICD-10-CM

## 2013-08-02 DIAGNOSIS — N390 Urinary tract infection, site not specified: Secondary | ICD-10-CM

## 2013-08-02 DIAGNOSIS — I10 Essential (primary) hypertension: Secondary | ICD-10-CM

## 2013-08-02 LAB — POCT URINALYSIS DIPSTICK
Bilirubin, UA: NEGATIVE
Blood, UA: NEGATIVE
Glucose, UA: NEGATIVE
Ketones, UA: NEGATIVE
Nitrite, UA: NEGATIVE
Spec Grav, UA: >=1.03
Urobilinogen, UA: NEGATIVE
pH, UA: 6

## 2013-08-02 LAB — POCT UA - MICROSCOPIC ONLY
Casts, Ur, LPF, POC: NEGATIVE
Crystals, Ur, HPF, POC: NEGATIVE
Mucus, UA: NEGATIVE
Yeast, UA: NEGATIVE

## 2013-08-02 LAB — POCT GLYCOSYLATED HEMOGLOBIN (HGB A1C): Hemoglobin A1C: 6.1

## 2013-08-02 NOTE — Patient Instructions (Signed)

## 2013-08-02 NOTE — Progress Notes (Signed)
Subjective:    Patient ID: Priscilla Daniels, female    DOB: July 05, 1940, 73 y.o.   MRN: [id number]  Patient here today for follow up-  No changes since last visit.  Hyperlipidemia This is a chronic problem. The current episode started more than 1 year ago. The problem is controlled. Recent lipid tests were reviewed and are normal. She has no history of diabetes, hypothyroidism or obesity. There are no known factors aggravating her hyperlipidemia. Pertinent negatives include no chest pain, leg pain or shortness of breath. Current antihyperlipidemic treatment includes statins. The current treatment provides no improvement of lipids. Compliance problems include adherence to diet.  Risk factors for coronary artery disease include dyslipidemia, family history, hypertension and post-menopausal.  Hypertension This is a chronic problem. The current episode started more than 1 year ago. The problem has been resolved since onset. The problem is controlled. Associated symptoms include blurred vision. Pertinent negatives include no anxiety, chest pain, palpitations, peripheral edema or shortness of breath. There are no associated agents to hypertension. Risk factors for coronary artery disease include dyslipidemia, family history and post-menopausal state. Past treatments include ACE inhibitors. The current treatment provides no improvement. Compliance problems include exercise.  There is no history of kidney disease or a thyroid problem. There is no history of sleep apnea.  Glaucoma/Macular degeneration Sees Dr. Georgia Lopes and Dr. Zadie Rhine   Review of Systems  Constitutional: Negative.   HENT: Negative.   Eyes: Positive for blurred vision.  Respiratory: Negative.  Negative for cough and shortness of breath.   Cardiovascular: Negative.  Negative for chest pain and palpitations.  Gastrointestinal: Negative.   Endocrine: Negative.   Genitourinary:       Pt states she "feels pressure" and doesn't feel like she  urinating enough  Musculoskeletal: Negative.   Skin: Negative.   Allergic/Immunologic: Positive for environmental allergies.  Hematological: Negative.   Psychiatric/Behavioral: Negative.        Objective:   Physical Exam  Constitutional: She is oriented to person, place, and time. She appears well-developed and well-nourished.  HENT:  Nose: Nose normal.  Mouth/Throat: Oropharynx is clear and moist.  Eyes: EOM are normal.  Neck: Trachea normal, normal range of motion and full passive range of motion without pain. Neck supple. No JVD present. Carotid bruit is not present. No thyromegaly present.  Cardiovascular: Normal rate, regular rhythm, normal heart sounds and intact distal pulses.  Exam reveals no gallop and no friction rub.   No murmur heard. Pulmonary/Chest: Effort normal and breath sounds normal.  Abdominal: Soft. Bowel sounds are normal. She exhibits no distension and no mass. There is no tenderness.  Musculoskeletal: Normal range of motion.  Lymphadenopathy:    She has no cervical adenopathy.  Neurological: She is alert and oriented to person, place, and time. She has normal reflexes.  Skin: Skin is warm and dry.  Psychiatric: She has a normal mood and affect. Her behavior is normal. Judgment and thought content normal.   BP 158/90  Pulse 68  Temp(Src) 97 F (36.1 C) (Oral)  Ht _0  (1.702 m)  Wt 140 lb (63.504 kg)  BMI 21.92 kg/m2   Results for orders placed in visit on 08/02/13  POCT GLYCOSYLATED HEMOGLOBIN (HGB A1C)      Result Value Ref Range   Hemoglobin A1C 6.1%    POCT UA - MICROSCOPIC ONLY      Result Value Ref Range   WBC, Ur, HPF, POC 1-3     RBC, urine, microscopic occ  Bacteria, U Microscopic few     Mucus, UA neg     Epithelial cells, urine per micros few     Crystals, Ur, HPF, POC neg     Casts, Ur, LPF, POC neg     Yeast, UA neg    POCT URINALYSIS DIPSTICK      Result Value Ref Range   Color, UA yellow     Clarity, UA clear      Glucose, UA neg     Bilirubin, UA neg     Ketones, UA neg     Spec Grav, UA >=1.030     Blood, UA neg     pH, UA 6.0     Protein, UA trace     Urobilinogen, UA negative     Nitrite, UA neg     Leukocytes, UA Trace           Assessment & Plan:   1. Unspecified essential hypertension   2. Other and unspecified hyperlipidemia   3. Type II or unspecified type diabetes mellitus without mention of complication, not stated as uncontrolled   4. Urinary tract infection, site not specified   5. Resolved UTI  Orders Placed This Encounter  Procedures  . CMP14+EGFR  . NMR, lipoprofile  . POCT glycosylated hemoglobin (Hb A1C)  . POCT UA - Microscopic Only  . POCT urinalysis dipstick    Labs pending Health maintenance reviewed Diet and exercise encouraged Continue all meds Follow up  In 6 months   [place], FNP

## 2013-08-07 LAB — CMP14+EGFR
A/G RATIO: 1.8 (ref 1.1–2.5)
ALK PHOS: 57 IU/L (ref 39–117)
ALT: 14 IU/L (ref 0–32)
AST: 23 IU/L (ref 0–40)
Albumin: 4.2 g/dL (ref 3.5–4.8)
BUN / CREAT RATIO: 16 (ref 11–26)
BUN: 13 mg/dL (ref 8–27)
CO2: 29 mmol/L (ref 18–29)
CREATININE: 0.8 mg/dL (ref 0.57–1.00)
Calcium: 9.6 mg/dL (ref 8.7–10.3)
Chloride: 105 mmol/L (ref 97–108)
GFR, EST AFRICAN AMERICAN: 85 mL/min/{1.73_m2} (ref >59)
GFR, EST NON AFRICAN AMERICAN: 74 mL/min/{1.73_m2} (ref >59)
GLOBULIN, TOTAL: 2.3 g/dL (ref 1.5–4.5)
Glucose: 111 mg/dL — ABNORMAL HIGH (ref 65–99)
Potassium: 5 mmol/L (ref 3.5–5.2)
SODIUM: 144 mmol/L (ref 134–144)
Total Bilirubin: 0.4 mg/dL (ref 0.0–1.2)
Total Protein: 6.5 g/dL (ref 6.0–8.5)

## 2013-08-07 LAB — NMR, LIPOPROFILE
Cholesterol: 170 mg/dL (ref <200)
HDL CHOLESTEROL BY NMR: 91 mg/dL (ref >=40)
HDL Particle Number: 37.7 umol/L (ref >=30.5)
LDL Particle Number: 751 nmol/L (ref <1000)
LDL Size: 21.3 nm (ref >20.5)
LDLC SERPL CALC-MCNC: 65 mg/dL (ref <100)
Triglycerides by NMR: 72 mg/dL (ref <150)

## 2013-10-25 ENCOUNTER — Ambulatory Visit: Payer: Medicare Other | Admitting: General Practice

## 2013-10-25 ENCOUNTER — Encounter: Payer: Self-pay | Admitting: Nurse Practitioner

## 2013-10-25 ENCOUNTER — Ambulatory Visit (INDEPENDENT_AMBULATORY_CARE_PROVIDER_SITE_OTHER): Payer: Medicare Other | Admitting: Nurse Practitioner

## 2013-10-25 VITALS — BP 150/84 | HR 73 | Temp 98.7°F | Ht 67.0 in | Wt 147.0 lb

## 2013-10-25 DIAGNOSIS — N390 Urinary tract infection, site not specified: Secondary | ICD-10-CM

## 2013-10-25 DIAGNOSIS — R300 Dysuria: Secondary | ICD-10-CM

## 2013-10-25 LAB — POCT UA - MICROSCOPIC ONLY
CASTS, UR, LPF, POC: NEGATIVE
Crystals, Ur, HPF, POC: NEGATIVE
Epithelial cells, urine per micros: NEGATIVE
Mucus, UA: NEGATIVE
YEAST UA: NEGATIVE

## 2013-10-25 LAB — POCT URINALYSIS DIPSTICK
BILIRUBIN UA: NEGATIVE
Glucose, UA: NEGATIVE
NITRITE UA: NEGATIVE
PH UA: 6
Protein, UA: NEGATIVE
Spec Grav, UA: 1.01
Urobilinogen, UA: NEGATIVE

## 2013-10-25 MED ORDER — CIPROFLOXACIN HCL 500 MG PO TABS: 500.0000 mg | ORAL_TABLET | Freq: Two times a day (BID) | ORAL | Status: DC

## 2013-10-25 NOTE — Progress Notes (Signed)
   Subjective:    Patient ID: Priscilla Daniels, female    DOB: 02/21/1941, 73 y.o.   MRN: [id number]  HPI Patient in C/O dysuria and frequency- Started about 1 week ago.    Review of Systems  Constitutional: Negative.   HENT: Negative.   Respiratory: Negative.   Cardiovascular: Negative.   Genitourinary: Positive for dysuria, urgency and frequency.  All other systems reviewed and are negative.      Objective:   Physical Exam  Constitutional: She is oriented to person, place, and time. She appears well-developed and well-nourished.  Cardiovascular: Normal rate, regular rhythm and normal heart sounds.   Pulmonary/Chest: Effort normal and breath sounds normal.  Neurological: She is alert and oriented to person, place, and time.  Skin: Skin is warm and dry.  Psychiatric: She has a normal mood and affect. Her behavior is normal. Judgment and thought content normal.   BP 150/84  Pulse 73  Temp(Src) 98.7 F (37.1 C) (Oral)  Ht 5\' 7"  (1.702 m)  Wt 147 lb (66.679 kg)  BMI 23.02 kg/m2   Results for orders placed in visit on 10/25/13  POCT UA - MICROSCOPIC ONLY      Result Value Ref Range   WBC, Ur, HPF, POC 10-20     RBC, urine, microscopic 1-5     Bacteria, U Microscopic occassional     Mucus, UA negative     Epithelial cells, urine per micros negative     Crystals, Ur, HPF, POC negative     Casts, Ur, LPF, POC negative     Yeast, UA negative    POCT URINALYSIS DIPSTICK      Result Value Ref Range   Color, UA yellow     Clarity, UA cloudy     Glucose, UA negative     Bilirubin, UA negative     Ketones, UA moderate     Spec Grav, UA 1.010     Blood, UA trace     pH, UA 6.0     Protein, UA negative     Urobilinogen, UA negative     Nitrite, UA negative     Leukocytes, UA large (3+)           Assessment & Plan:   1. Dysuria   2. UTI (urinary tract infection)    Meds ordered this encounter  Medications  . ciprofloxacin (CIPRO) 500 MG tablet    Sig: Take 1  tablet (500 mg total) by mouth 2 (two) times daily.    Dispense:  6 tablet    Refill:  0    Order Specific Question:  Supervising Provider    Answer:  Ernestina PennaMOORE, DONALD W [[id number]]   Force fluids AZO over the counter X2 days RTO prn Culture pending  Mary-Margaret Daphine DeutscherMartin, FNP

## 2013-10-25 NOTE — Patient Instructions (Signed)
Urinary Tract Infection  Urinary tract infections (UTIs) can develop anywhere along your urinary tract. Your urinary tract is your body's drainage system for removing wastes and extra water. Your urinary tract includes two kidneys, two ureters, a bladder, and a urethra. Your kidneys are a pair of bean-shaped organs. Each kidney is about the size of your fist. They are located below your ribs, one on each side of your spine.  CAUSES  Infections are caused by microbes, which are microscopic organisms, including fungi, viruses, and bacteria. These organisms are so small that they can only be seen through a microscope. Bacteria are the microbes that most commonly cause UTIs.  SYMPTOMS   Symptoms of UTIs may vary by age and gender of the patient and by the location of the infection. Symptoms in young women typically include a frequent and intense urge to urinate and a painful, burning feeling in the bladder or urethra during urination. Older women and men are more likely to be tired, shaky, and weak and have muscle aches and abdominal pain. A fever may mean the infection is in your kidneys. Other symptoms of a kidney infection include pain in your back or sides below the ribs, nausea, and vomiting.  DIAGNOSIS  To diagnose a UTI, your caregiver will ask you about your symptoms. Your caregiver also will ask to provide a urine sample. The urine sample will be tested for bacteria and white blood cells. White blood cells are made by your body to help fight infection.  TREATMENT   Typically, UTIs can be treated with medication. Because most UTIs are caused by a bacterial infection, they usually can be treated with the use of antibiotics. The choice of antibiotic and length of treatment depend on your symptoms and the type of bacteria causing your infection.  HOME CARE INSTRUCTIONS   If you were prescribed antibiotics, take them exactly as your caregiver instructs you. Finish the medication even if you feel better after you  have only taken some of the medication.   Drink enough water and fluids to keep your urine clear or pale yellow.   Avoid caffeine, tea, and carbonated beverages. They tend to irritate your bladder.   Empty your bladder often. Avoid holding urine for long periods of time.   Empty your bladder before and after sexual intercourse.   After a bowel movement, women should cleanse from front to back. Use each tissue only once.  SEEK MEDICAL CARE IF:    You have back pain.   You develop a fever.   Your symptoms do not begin to resolve within 3 days.  SEEK IMMEDIATE MEDICAL CARE IF:    You have severe back pain or lower abdominal pain.   You develop chills.   You have nausea or vomiting.   You have continued burning or discomfort with urination.  MAKE SURE YOU:    Understand these instructions.   Will watch your condition.   Will get help right away if you are not doing well or get worse.  Document Released: 03/18/2005 Document Revised: 12/08/2011 Document Reviewed: 07/17/2011  [employer] Patient Information 2014 [employer].

## 2013-10-27 ENCOUNTER — Telehealth: Payer: Self-pay | Admitting: Nurse Practitioner

## 2013-10-27 LAB — URINE CULTURE

## 2013-10-30 NOTE — Telephone Encounter (Signed)
Patient aware.

## 2014-01-22 ENCOUNTER — Other Ambulatory Visit (INDEPENDENT_AMBULATORY_CARE_PROVIDER_SITE_OTHER): Payer: Medicare Other

## 2014-01-22 DIAGNOSIS — I10 Essential (primary) hypertension: Secondary | ICD-10-CM

## 2014-01-22 DIAGNOSIS — E785 Hyperlipidemia, unspecified: Secondary | ICD-10-CM

## 2014-01-23 LAB — NMR, LIPOPROFILE
Cholesterol: 194 mg/dL (ref 100–199)
HDL Cholesterol by NMR: 86 mg/dL (ref >39)
HDL Particle Number: 31.7 umol/L (ref >=30.5)
LDL Particle Number: 1065 nmol/L — ABNORMAL HIGH (ref <1000)
LDL SIZE: 21.6 nm (ref >20.5)
LDLC SERPL CALC-MCNC: 93 mg/dL (ref 0–99)
TRIGLYCERIDES BY NMR: 76 mg/dL (ref 0–149)

## 2014-01-23 LAB — CMP14+EGFR
ALT: 12 IU/L (ref 0–32)
AST: 17 IU/L (ref 0–40)
Albumin/Globulin Ratio: 2.1 (ref 1.1–2.5)
Albumin: 4.1 g/dL (ref 3.5–4.8)
Alkaline Phosphatase: 50 IU/L (ref 39–117)
BILIRUBIN TOTAL: 0.2 mg/dL (ref 0.0–1.2)
BUN/Creatinine Ratio: 16 (ref 11–26)
BUN: 12 mg/dL (ref 8–27)
CO2: 26 mmol/L (ref 18–29)
Calcium: 9.2 mg/dL (ref 8.7–10.3)
Chloride: 105 mmol/L (ref 97–108)
Creatinine, Ser: 0.77 mg/dL (ref 0.57–1.00)
GFR calc non Af Amer: 77 mL/min/{1.73_m2} (ref >59)
GFR, EST AFRICAN AMERICAN: 89 mL/min/{1.73_m2} (ref >59)
GLUCOSE: 125 mg/dL — AB (ref 65–99)
Globulin, Total: 2 g/dL (ref 1.5–4.5)
POTASSIUM: 4.7 mmol/L (ref 3.5–5.2)
SODIUM: 144 mmol/L (ref 134–144)
TOTAL PROTEIN: 6.1 g/dL (ref 6.0–8.5)

## 2014-02-28 ENCOUNTER — Encounter: Payer: Self-pay | Admitting: Nurse Practitioner

## 2014-02-28 ENCOUNTER — Ambulatory Visit (INDEPENDENT_AMBULATORY_CARE_PROVIDER_SITE_OTHER): Payer: Medicare Other | Admitting: Nurse Practitioner

## 2014-02-28 VITALS — BP 121/75 | HR 63 | Temp 96.5°F | Ht 67.0 in | Wt 151.0 lb

## 2014-02-28 DIAGNOSIS — R300 Dysuria: Secondary | ICD-10-CM

## 2014-02-28 DIAGNOSIS — N3000 Acute cystitis without hematuria: Secondary | ICD-10-CM

## 2014-02-28 LAB — POCT URINALYSIS DIPSTICK
Bilirubin, UA: NEGATIVE
Blood, UA: NEGATIVE
GLUCOSE UA: NEGATIVE
Ketones, UA: NEGATIVE
NITRITE UA: NEGATIVE
SPEC GRAV UA: 1.015
UROBILINOGEN UA: NEGATIVE
pH, UA: 6

## 2014-02-28 LAB — POCT UA - MICROSCOPIC ONLY
Casts, Ur, LPF, POC: NEGATIVE
Crystals, Ur, HPF, POC: NEGATIVE
Mucus, UA: NEGATIVE
YEAST UA: NEGATIVE

## 2014-02-28 MED ORDER — CIPROFLOXACIN HCL 500 MG PO TABS: 500.0000 mg | ORAL_TABLET | Freq: Two times a day (BID) | ORAL | Status: DC

## 2014-02-28 NOTE — Progress Notes (Signed)
   Subjective:    Patient ID: Priscilla Daniels, female    DOB: Jan 25, 1941, 73 y.o.   MRN: [id number]  HPI Patient in today with c/o dysuria and urinary frequency- Started several days ago.    Review of Systems  Constitutional: Negative for fever and chills.  HENT: Negative.   Respiratory: Negative.   Genitourinary: Positive for dysuria, urgency and frequency.  Neurological: Negative.   Psychiatric/Behavioral: Negative.   All other systems reviewed and are negative.      Objective:   Physical Exam  Constitutional: She is oriented to person, place, and time. She appears well-developed and well-nourished.  Cardiovascular: Normal rate, regular rhythm and normal heart sounds.   Pulmonary/Chest: Effort normal and breath sounds normal.  Abdominal: There is tenderness (mild suprapubic pain on palpation.).  Genitourinary:  NO CVA tenderness  Neurological: She is oriented to person, place, and time.  Skin: Skin is warm.  Psychiatric: She has a normal mood and affect. Her behavior is normal. Judgment and thought content normal.   BP 121/75  Pulse 63  Temp(Src) 96.5 F (35.8 C) (Oral)  Ht  (1.702 m)  Wt 151 lb (68.493 kg)  BMI 23.64 kg/m2         Assessment & Plan:   1. Dysuria   2. Acute cystitis without hematuria    Meds ordered this encounter  Medications  . ciprofloxacin (CIPRO) 500 MG tablet    Sig: Take 1 tablet (500 mg total) by mouth 2 (two) times daily.    Dispense:  20 tablet    Refill:  0    Order Specific Question:  Supervising Provider    Answer:  Deborra Medina   Force fluids AZO over the counter X2 days RTO prn Culture pending  Mary-Margaret Daphine Deutscher, FNP

## 2014-02-28 NOTE — Patient Instructions (Signed)

## 2014-03-01 LAB — URINE CULTURE

## 2014-04-03 ENCOUNTER — Ambulatory Visit: Payer: Medicare Other

## 2014-04-05 ENCOUNTER — Ambulatory Visit (INDEPENDENT_AMBULATORY_CARE_PROVIDER_SITE_OTHER): Payer: Medicare Other

## 2014-04-05 DIAGNOSIS — Z23 Encounter for immunization: Secondary | ICD-10-CM

## 2014-06-11 ENCOUNTER — Encounter: Payer: Self-pay | Admitting: Nurse Practitioner

## 2014-06-11 ENCOUNTER — Ambulatory Visit (INDEPENDENT_AMBULATORY_CARE_PROVIDER_SITE_OTHER): Payer: Medicare Other | Admitting: Nurse Practitioner

## 2014-06-11 ENCOUNTER — Other Ambulatory Visit: Payer: Self-pay | Admitting: Nurse Practitioner

## 2014-06-11 VITALS — BP 185/99 | HR 80 | Temp 96.9°F | Ht 67.0 in | Wt 152.0 lb

## 2014-06-11 DIAGNOSIS — N3000 Acute cystitis without hematuria: Secondary | ICD-10-CM

## 2014-06-11 DIAGNOSIS — R300 Dysuria: Secondary | ICD-10-CM

## 2014-06-11 LAB — POCT URINALYSIS DIPSTICK
BILIRUBIN UA: NEGATIVE
Glucose, UA: NEGATIVE
Ketones, UA: NEGATIVE
Nitrite, UA: NEGATIVE
Spec Grav, UA: 1.01
Urobilinogen, UA: NEGATIVE
pH, UA: 6

## 2014-06-11 LAB — POCT UA - MICROSCOPIC ONLY
CRYSTALS, UR, HPF, POC: NEGATIVE
Casts, Ur, LPF, POC: NEGATIVE
Mucus, UA: NEGATIVE
Yeast, UA: NEGATIVE

## 2014-06-11 MED ORDER — CIPROFLOXACIN HCL 500 MG PO TABS: 500.0000 mg | ORAL_TABLET | Freq: Two times a day (BID) | ORAL | Status: DC

## 2014-06-11 NOTE — Patient Instructions (Signed)

## 2014-06-11 NOTE — Progress Notes (Signed)
   Subjective:    Patient ID: Priscilla Daniels, female    DOB: 09/28/1940, 73 y.o.   MRN: [id number]  HPI Patient in today c/o urianry frequency with scant amount- started 4 days ago- has gotten worse.  * Patient ran out of blood pressure meds over a month ago and has just not bothered to get refilled  Review of Systems  Constitutional: Negative.   HENT: Negative.   Respiratory: Negative.   Cardiovascular: Negative.   Genitourinary: Positive for dysuria, urgency and frequency.  Neurological: Negative.   Psychiatric/Behavioral: Negative.   All other systems reviewed and are negative.      Objective:   Physical Exam  Constitutional: She is oriented to person, place, and time. She appears well-developed and well-nourished.  Cardiovascular: Normal rate, regular rhythm and normal heart sounds.   Pulmonary/Chest: Effort normal and breath sounds normal.  Abdominal: Soft. Bowel sounds are normal.  Genitourinary:  No CVA tenderness  Neurological: She is alert and oriented to person, place, and time.  Skin: Skin is warm and dry.  Psychiatric: She has a normal mood and affect. Her behavior is normal. Judgment and thought content normal.   BP 185/99 mmHg  Pulse 80  Temp(Src) 96.9 F (36.1 C) (Oral)  Ht 5\' 7"  (1.702 m)  Wt 152 lb (68.947 kg)  BMI 23.80 kg/m2   Results for orders placed or performed in visit on 06/11/14  POCT UA - Microscopic Only  Result Value Ref Range   WBC, Ur, HPF, POC 1-3    RBC, urine, microscopic 10-12    Bacteria, U Microscopic few    Mucus, UA neg    Epithelial cells, urine per micros few    Crystals, Ur, HPF, POC neg    Casts, Ur, LPF, POC neg    Yeast, UA neg   POCT urinalysis dipstick  Result Value Ref Range   Color, UA gold    Clarity, UA clear    Glucose, UA neg    Bilirubin, UA neg    Ketones, UA neg    Spec Grav, UA 1.010    Blood, UA mod    pH, UA 6.0    Protein, UA 1+    Urobilinogen, UA negative    Nitrite, UA neg    Leukocytes, UA  large (3+)         Assessment & Plan:  1. Dysuria - POCT UA - Microscopic Only - POCT urinalysis dipstick  2. Acute cystitis without hematuria Force fluids AZO over the counter X2 days RTO prn Culture pending - ciprofloxacin (CIPRO) 500 MG tablet; Take 1 tablet (500 mg total) by mouth 2 (two) times daily.  Dispense: 6 tablet; Refill: 0   Priscilla Daphine DeutscherMartin, FNP

## 2014-06-13 LAB — URINE CULTURE

## 2014-07-03 DIAGNOSIS — H35352 Cystoid macular degeneration, left eye: Secondary | ICD-10-CM | POA: Diagnosis not present

## 2014-07-03 DIAGNOSIS — H34812 Central retinal vein occlusion, left eye: Secondary | ICD-10-CM | POA: Diagnosis not present

## 2014-08-14 DIAGNOSIS — H34812 Central retinal vein occlusion, left eye: Secondary | ICD-10-CM | POA: Diagnosis not present

## 2014-08-14 DIAGNOSIS — L259 Unspecified contact dermatitis, unspecified cause: Secondary | ICD-10-CM | POA: Diagnosis not present

## 2014-08-16 ENCOUNTER — Telehealth: Payer: Self-pay | Admitting: Nurse Practitioner

## 2014-08-16 NOTE — Telephone Encounter (Signed)
appt given for tomorrow per patient request 

## 2014-08-17 ENCOUNTER — Encounter: Payer: Self-pay | Admitting: Nurse Practitioner

## 2014-08-17 ENCOUNTER — Ambulatory Visit (INDEPENDENT_AMBULATORY_CARE_PROVIDER_SITE_OTHER): Payer: Medicare Other | Admitting: Nurse Practitioner

## 2014-08-17 VITALS — BP 128/82 | HR 75 | Temp 96.8°F | Ht 67.0 in | Wt 151.0 lb

## 2014-08-17 DIAGNOSIS — R350 Frequency of micturition: Secondary | ICD-10-CM

## 2014-08-17 DIAGNOSIS — I10 Essential (primary) hypertension: Secondary | ICD-10-CM | POA: Diagnosis not present

## 2014-08-17 DIAGNOSIS — E785 Hyperlipidemia, unspecified: Secondary | ICD-10-CM

## 2014-08-17 DIAGNOSIS — N3000 Acute cystitis without hematuria: Secondary | ICD-10-CM

## 2014-08-17 LAB — POCT URINALYSIS DIPSTICK
Bilirubin, UA: NEGATIVE
Blood, UA: NEGATIVE
GLUCOSE UA: NEGATIVE
Ketones, UA: NEGATIVE
Nitrite, UA: NEGATIVE
Protein, UA: NEGATIVE
UROBILINOGEN UA: NEGATIVE
pH, UA: 6.5

## 2014-08-17 LAB — POCT UA - MICROSCOPIC ONLY
Casts, Ur, LPF, POC: NEGATIVE
Crystals, Ur, HPF, POC: NEGATIVE
MUCUS UA: NEGATIVE
RBC, urine, microscopic: NEGATIVE
Yeast, UA: NEGATIVE

## 2014-08-17 MED ORDER — LISINOPRIL-HYDROCHLOROTHIAZIDE 20-12.5 MG PO TABS: 1.0000 | ORAL_TABLET | Freq: Every day | ORAL | Status: DC

## 2014-08-17 MED ORDER — DOXYCYCLINE HYCLATE 100 MG PO TABS: 100.0000 mg | ORAL_TABLET | Freq: Two times a day (BID) | ORAL | Status: DC

## 2014-08-17 NOTE — Progress Notes (Signed)
Subjective:    Patient ID: Priscilla Daniels, female    DOB: [birth year], 74 y.o.   MRN: [id number]  Patient here today for follow up-  No changes since last visit.  Hyperlipidemia This is a chronic problem. The current episode started more than 1 year ago. The problem is controlled. Recent lipid tests were reviewed and are normal. She has no history of diabetes, hypothyroidism or obesity. Pertinent negatives include no chest pain or shortness of breath. Current antihyperlipidemic treatment includes statins. The current treatment provides moderate improvement of lipids. Compliance problems include adherence to diet and adherence to exercise.  Risk factors for coronary artery disease include dyslipidemia, hypertension and post-menopausal.  Hypertension This is a chronic problem. The current episode started more than 1 year ago. The problem is unchanged. The problem is controlled. Pertinent negatives include no chest pain, palpitations or shortness of breath. Risk factors for coronary artery disease include dyslipidemia, post-menopausal state and sedentary lifestyle. Past treatments include ACE inhibitors and diuretics. The current treatment provides moderate improvement. Compliance problems include diet and exercise.   Glaucoma/Macular degeneration Sees Dr. Georgia Lopes and Dr. Zadie Rhine   Review of Systems  Constitutional: Negative.   HENT: Negative.   Respiratory: Negative.  Negative for cough and shortness of breath.   Cardiovascular: Negative.  Negative for chest pain and palpitations.  Gastrointestinal: Negative.   Endocrine: Negative.   Genitourinary:       Pt states she "feels pressure" and doesn't feel like she urinating enough  Musculoskeletal: Negative.   Skin: Negative.   Allergic/Immunologic: Positive for environmental allergies.  Hematological: Negative.   Psychiatric/Behavioral: Negative.        Objective:   Physical Exam  Constitutional: She is oriented to person, place, and time.  She appears well-developed and well-nourished.  HENT:  Nose: Nose normal.  Mouth/Throat: Oropharynx is clear and moist.  Eyes: EOM are normal.  Neck: Trachea normal, normal range of motion and full passive range of motion without pain. Neck supple. No JVD present. Carotid bruit is not present. No thyromegaly present.  Cardiovascular: Normal rate, regular rhythm, normal heart sounds and intact distal pulses.  Exam reveals no gallop and no friction rub.   No murmur heard. Pulmonary/Chest: Effort normal and breath sounds normal.  Abdominal: Soft. Bowel sounds are normal. She exhibits no distension and no mass. There is no tenderness.  Musculoskeletal: Normal range of motion.  Lymphadenopathy:    She has no cervical adenopathy.  Neurological: She is alert and oriented to person, place, and time. She has normal reflexes.  Skin: Skin is warm and dry.  Psychiatric: She has a normal mood and affect. Her behavior is normal. Judgment and thought content normal.    BP 128/82 mmHg  Pulse 75  Temp(Src) 96.8 F (36 C) (Oral)  Ht _0  (1.702 m)  Wt 151 lb (68.493 kg)  BMI 23.64 kg/m2   Results for orders placed or performed in visit on 08/17/14  POCT UA - Microscopic Only  Result Value Ref Range   WBC, Ur, HPF, POC 10-15    RBC, urine, microscopic neg    Bacteria, U Microscopic moderate    Mucus, UA neg    Epithelial cells, urine per micros occ    Crystals, Ur, HPF, POC neg    Casts, Ur, LPF, POC neg    Yeast, UA neg   POCT urinalysis dipstick  Result Value Ref Range   Color, UA yellow    Clarity, UA cloudy    Glucose, UA  neg    Bilirubin, UA neg    Ketones, UA neg    Spec Grav, UA <=1.005    Blood, UA neg    pH, UA 6.5    Protein, UA neg    Urobilinogen, UA negative    Nitrite, UA neg    Leukocytes, UA large (3+)            Assessment & Plan:   1. Essential hypertension Do not add slat to diet - CMP14+EGFR - lisinopril-hydrochlorothiazide (PRINZIDE,ZESTORETIC)  20-12.5 MG per tablet; Take 1 tablet by mouth daily.  Dispense: 30 tablet; Refill: 5  2. Hyperlipidemia with target LDL less than 100 Low fat idet - NMR, lipoprofile  3. Urinary frequency - POCT UA - Microscopic Only - POCT urinalysis dipstick  4. Acute cystitis without hematuria Take medication as prescribe Cotton underwear Take shower not bath Cranberry juice, yogurt Force fluids AZO over the counter X2 days Culture pending RTO prn - doxycycline (VIBRA-TABS) 100 MG tablet; Take 1 tablet (100 mg total) by mouth 2 (two) times daily. 1 po bid  Dispense: 20 tablet; Refill: 0    Labs pending Health maintenance reviewed Diet and exercise encouraged Continue all meds Follow up  In 3 months   [place], FNP

## 2014-08-17 NOTE — Patient Instructions (Signed)
Exercise to Stay Healthy Exercise helps you become and stay healthy. EXERCISE IDEAS AND TIPS Choose exercises that:  You enjoy.  Fit into your day. You do not need to exercise really hard to be healthy. You can do exercises at a slow or medium level and stay healthy. You can:  Stretch before and after working out.  Try yoga, Pilates, or tai chi.  Lift weights.  Walk fast, swim, jog, run, climb stairs, bicycle, dance, or rollerskate.  Take aerobic classes. Exercises that burn about 150 calories:  Running 1  miles in 15 minutes.  Playing volleyball for 45 to 60 minutes.  Washing and waxing a car for 45 to 60 minutes.  Playing touch football for 45 minutes.  Walking 1  miles in 35 minutes.  Pushing a stroller 1  miles in 30 minutes.  Playing basketball for 30 minutes.  Raking leaves for 30 minutes.  Bicycling 5 miles in 30 minutes.  Walking 2 miles in 30 minutes.  Dancing for 30 minutes.  Shoveling snow for 15 minutes.  Swimming laps for 20 minutes.  Walking up stairs for 15 minutes.  Bicycling 4 miles in 15 minutes.  Gardening for 30 to 45 minutes.  Jumping rope for 15 minutes.  Washing windows or floors for 45 to 60 minutes. Document Released: 07/11/2010 Document Revised: 08/31/2011 Document Reviewed: 07/11/2010 [employer] Patient Information 2015 [employer]. This information is not intended to replace advice given to you by your health care provider. Make sure you discuss any questions you have with your health care provider.  

## 2014-08-17 NOTE — Addendum Note (Signed)
Addended by: Tommas OlpHANDY, Cache Bills N on: 08/17/2014 04:40 PM   Modules accepted: Orders

## 2014-08-17 NOTE — Addendum Note (Signed)
Addended by: Tineshia Becraft M on: 08/17/2014 04:42 PM   Modules accepted: SmartSet  

## 2014-08-18 LAB — NMR, LIPOPROFILE
CHOLESTEROL: 195 mg/dL (ref 100–199)
HDL Cholesterol by NMR: 97 mg/dL (ref >39)
HDL PARTICLE NUMBER: 31.1 umol/L (ref >=30.5)
LDL Particle Number: 980 nmol/L (ref <1000)
LDL Size: 21.5 nm (ref >20.5)
LDL-C: 79 mg/dL (ref 0–99)
Small LDL Particle Number: <90 nmol/L (ref <=527)
Triglycerides by NMR: 96 mg/dL (ref 0–149)

## 2014-08-18 LAB — CMP14+EGFR
ALBUMIN: 4.2 g/dL (ref 3.5–4.8)
ALT: 10 IU/L (ref 0–32)
AST: 19 IU/L (ref 0–40)
Albumin/Globulin Ratio: 1.6 (ref 1.1–2.5)
Alkaline Phosphatase: 51 IU/L (ref 39–117)
BUN/Creatinine Ratio: 20 (ref 11–26)
BUN: 21 mg/dL (ref 8–27)
Bilirubin Total: 0.3 mg/dL (ref 0.0–1.2)
CHLORIDE: 96 mmol/L — AB (ref 97–108)
CO2: 27 mmol/L (ref 18–29)
Calcium: 9.3 mg/dL (ref 8.7–10.3)
Creatinine, Ser: 1.07 mg/dL — ABNORMAL HIGH (ref 0.57–1.00)
GFR calc Af Amer: 60 mL/min/{1.73_m2} (ref >59)
GFR calc non Af Amer: 52 mL/min/{1.73_m2} — ABNORMAL LOW (ref >59)
Globulin, Total: 2.6 g/dL (ref 1.5–4.5)
Glucose: 94 mg/dL (ref 65–99)
Potassium: 4.8 mmol/L (ref 3.5–5.2)
Sodium: 136 mmol/L (ref 134–144)
Total Protein: 6.8 g/dL (ref 6.0–8.5)

## 2014-08-19 LAB — URINE CULTURE

## 2014-09-04 DIAGNOSIS — L209 Atopic dermatitis, unspecified: Secondary | ICD-10-CM | POA: Diagnosis not present

## 2014-09-04 DIAGNOSIS — H00029 Hordeolum internum unspecified eye, unspecified eyelid: Secondary | ICD-10-CM | POA: Diagnosis not present

## 2014-09-04 DIAGNOSIS — H1045 Other chronic allergic conjunctivitis: Secondary | ICD-10-CM | POA: Diagnosis not present

## 2014-09-04 DIAGNOSIS — H4011X1 Primary open-angle glaucoma, mild stage: Secondary | ICD-10-CM | POA: Diagnosis not present

## 2014-09-18 DIAGNOSIS — H00029 Hordeolum internum unspecified eye, unspecified eyelid: Secondary | ICD-10-CM | POA: Diagnosis not present

## 2014-09-18 DIAGNOSIS — H4011X1 Primary open-angle glaucoma, mild stage: Secondary | ICD-10-CM | POA: Diagnosis not present

## 2014-09-25 DIAGNOSIS — H34812 Central retinal vein occlusion, left eye: Secondary | ICD-10-CM | POA: Diagnosis not present

## 2014-10-02 DIAGNOSIS — H1045 Other chronic allergic conjunctivitis: Secondary | ICD-10-CM | POA: Diagnosis not present

## 2014-10-02 DIAGNOSIS — H4011X1 Primary open-angle glaucoma, mild stage: Secondary | ICD-10-CM | POA: Diagnosis not present

## 2014-11-13 DIAGNOSIS — H34812 Central retinal vein occlusion, left eye: Secondary | ICD-10-CM | POA: Diagnosis not present

## 2014-11-26 DIAGNOSIS — H4011X1 Primary open-angle glaucoma, mild stage: Secondary | ICD-10-CM | POA: Diagnosis not present

## 2015-01-04 DIAGNOSIS — H4011X1 Primary open-angle glaucoma, mild stage: Secondary | ICD-10-CM | POA: Diagnosis not present

## 2015-01-04 DIAGNOSIS — Z1231 Encounter for screening mammogram for malignant neoplasm of breast: Secondary | ICD-10-CM | POA: Diagnosis not present

## 2015-01-04 DIAGNOSIS — H4011X2 Primary open-angle glaucoma, moderate stage: Secondary | ICD-10-CM | POA: Diagnosis not present

## 2015-01-16 ENCOUNTER — Other Ambulatory Visit: Payer: Self-pay | Admitting: Nurse Practitioner

## 2015-01-16 DIAGNOSIS — I10 Essential (primary) hypertension: Secondary | ICD-10-CM

## 2015-01-16 MED ORDER — LISINOPRIL-HYDROCHLOROTHIAZIDE 20-12.5 MG PO TABS: 1.0000 | ORAL_TABLET | Freq: Every day | ORAL | Status: DC

## 2015-01-16 NOTE — Telephone Encounter (Signed)
Patient aware that 1 refill was sent to pharmacy and that she will have to be seen for any further refills.

## 2015-01-17 ENCOUNTER — Ambulatory Visit: Payer: Medicare Other | Admitting: Nurse Practitioner

## 2015-01-22 DIAGNOSIS — H34812 Central retinal vein occlusion, left eye: Secondary | ICD-10-CM | POA: Diagnosis not present

## 2015-01-28 ENCOUNTER — Encounter: Payer: Self-pay | Admitting: Nurse Practitioner

## 2015-02-08 ENCOUNTER — Ambulatory Visit (INDEPENDENT_AMBULATORY_CARE_PROVIDER_SITE_OTHER): Payer: Medicare Other

## 2015-02-08 ENCOUNTER — Encounter: Payer: Self-pay | Admitting: Nurse Practitioner

## 2015-02-08 ENCOUNTER — Ambulatory Visit (INDEPENDENT_AMBULATORY_CARE_PROVIDER_SITE_OTHER): Payer: Medicare Other | Admitting: Nurse Practitioner

## 2015-02-08 VITALS — BP 110/74 | HR 71 | Temp 97.6°F | Ht 67.0 in | Wt 150.0 lb

## 2015-02-08 DIAGNOSIS — I10 Essential (primary) hypertension: Secondary | ICD-10-CM

## 2015-02-08 DIAGNOSIS — E785 Hyperlipidemia, unspecified: Secondary | ICD-10-CM

## 2015-02-08 DIAGNOSIS — Z1212 Encounter for screening for malignant neoplasm of rectum: Secondary | ICD-10-CM

## 2015-02-08 DIAGNOSIS — R300 Dysuria: Secondary | ICD-10-CM | POA: Diagnosis not present

## 2015-02-08 DIAGNOSIS — N3000 Acute cystitis without hematuria: Secondary | ICD-10-CM

## 2015-02-08 DIAGNOSIS — I700 Atherosclerosis of aorta: Secondary | ICD-10-CM | POA: Insufficient documentation

## 2015-02-08 LAB — POCT URINALYSIS DIPSTICK
Bilirubin, UA: NEGATIVE
Glucose, UA: NEGATIVE
KETONES UA: NEGATIVE
Nitrite, UA: POSITIVE
PROTEIN UA: NEGATIVE
SPEC GRAV UA: 1.02
Urobilinogen, UA: NEGATIVE
pH, UA: 5

## 2015-02-08 LAB — POCT UA - MICROSCOPIC ONLY
CASTS, UR, LPF, POC: NEGATIVE
CRYSTALS, UR, HPF, POC: NEGATIVE
Mucus, UA: NEGATIVE
YEAST UA: NEGATIVE

## 2015-02-08 IMAGING — CR DG CHEST 2V
2 series · 2 of 2 positions shown · non-contrast
Comparison: [DATE]

CLINICAL DATA: Essential hypertension

EXAM:
CHEST  2 VIEW

[view not recorded (1 of 2)]
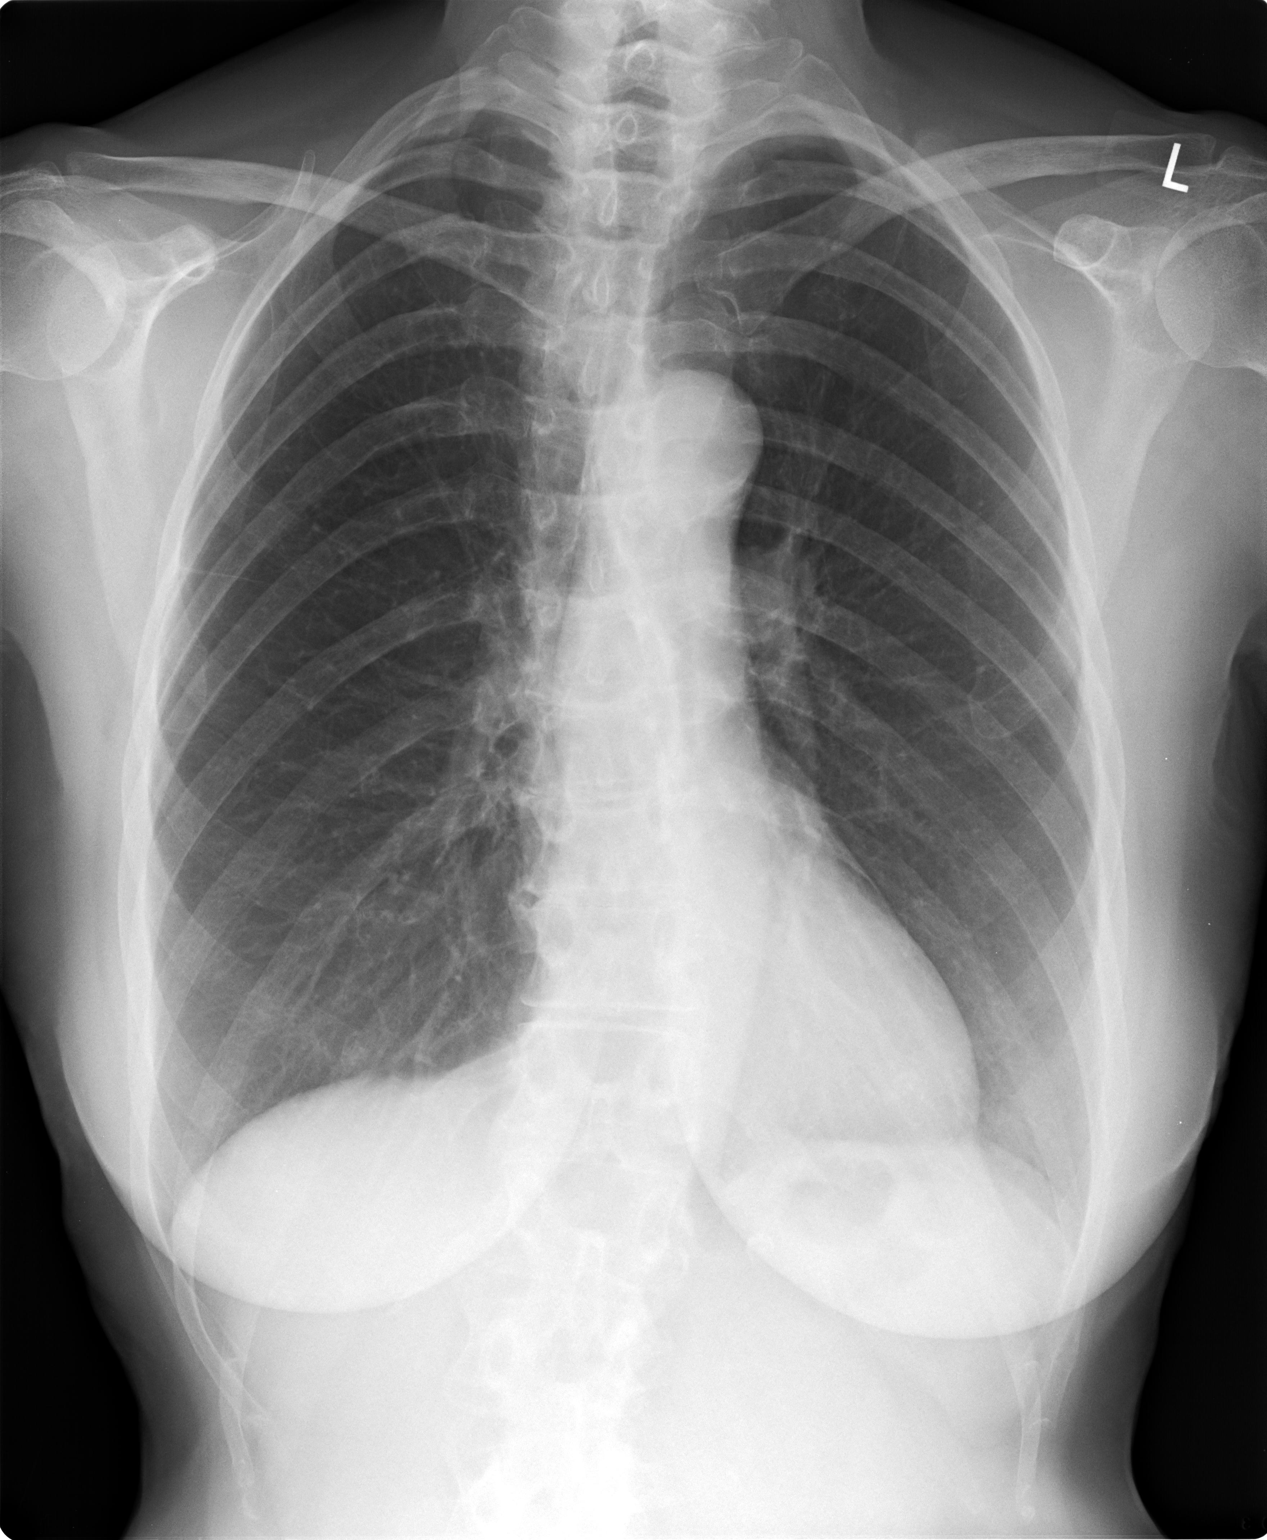

[view not recorded (2 of 2)]
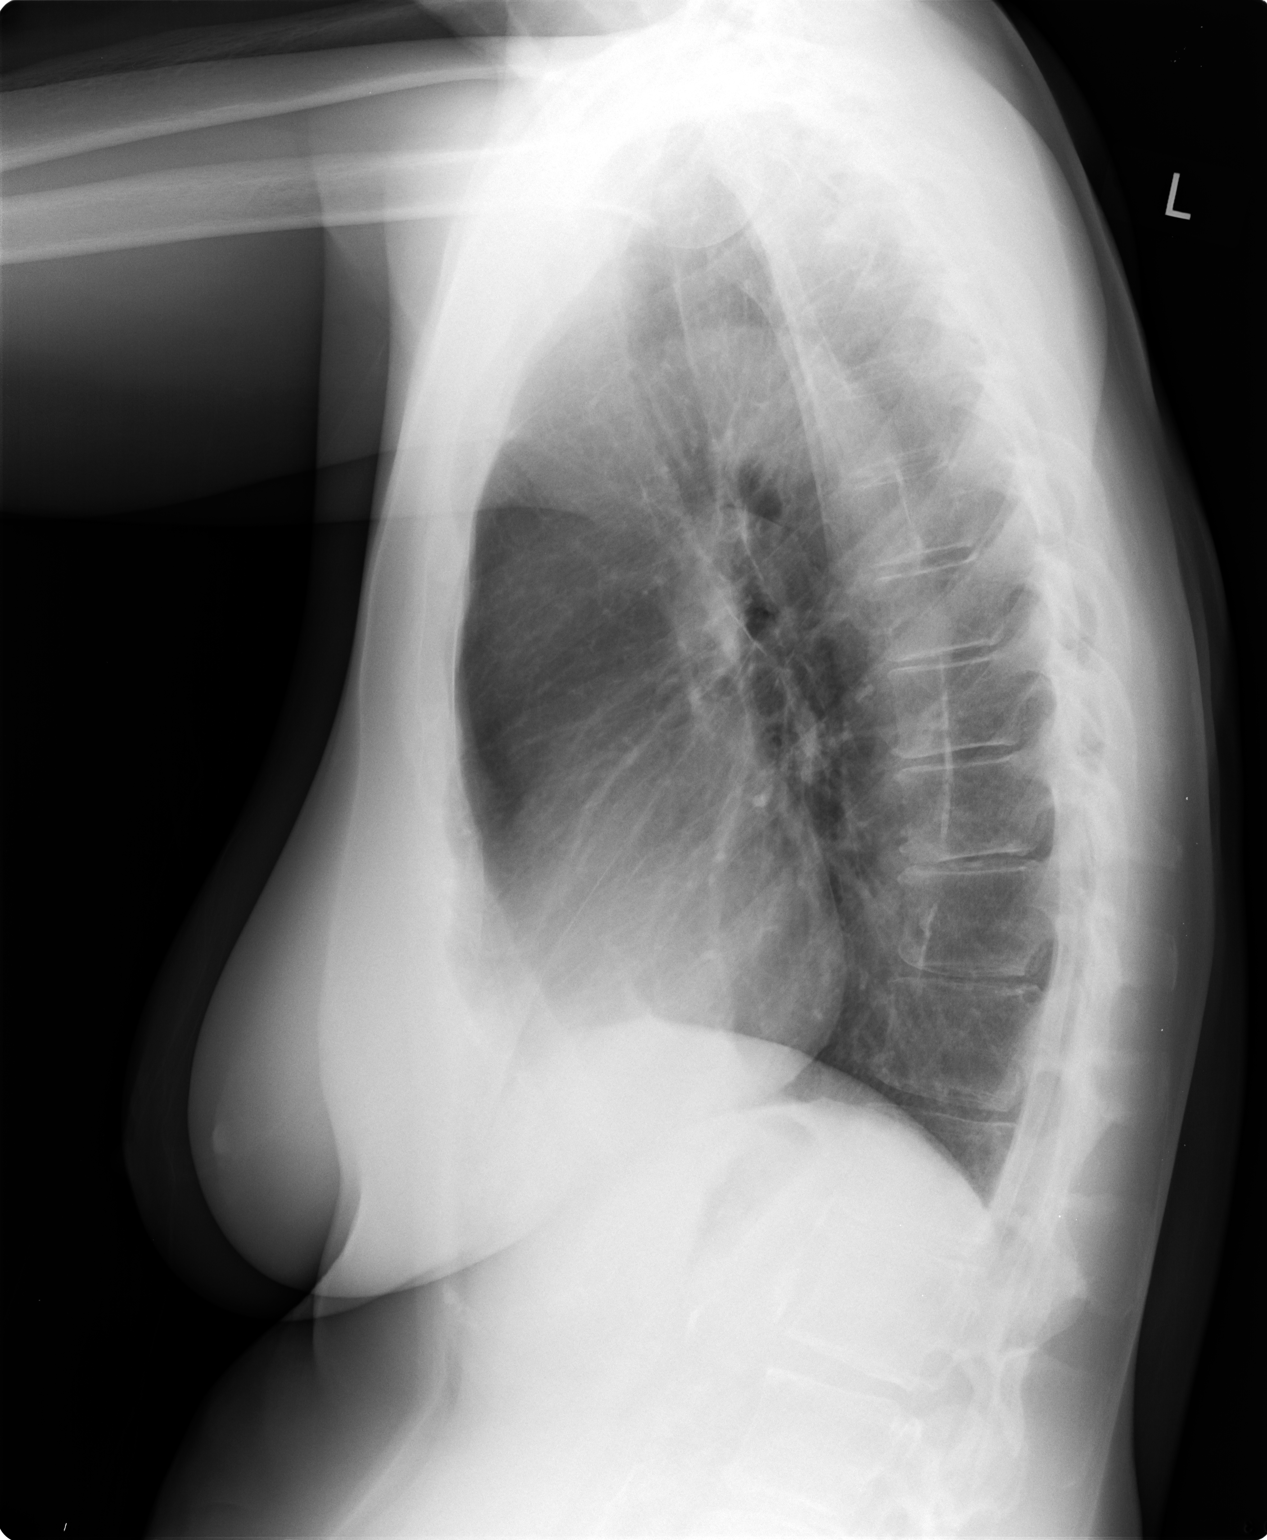

[2 of 2 positions shown; findings below may reference images not displayed]

FINDINGS: There is no edema or consolidation. Heart size and pulmonary
vascularity are normal. No adenopathy. There is atherosclerotic
change in the aorta. There is lower thoracic levoscoliosis with
upper lumbar dextroscoliosis.
IMPRESSION: No edema or consolidation. Atherosclerotic change in aorta.
Scoliosis.

## 2015-02-08 MED ORDER — LISINOPRIL-HYDROCHLOROTHIAZIDE 20-12.5 MG PO TABS
1.0000 | ORAL_TABLET | Freq: Every day | ORAL | Status: DC
Start: 2015-02-08 — End: 2015-08-19

## 2015-02-08 MED ORDER — CIPROFLOXACIN HCL 500 MG PO TABS: 500.0000 mg | ORAL_TABLET | Freq: Two times a day (BID) | ORAL | Status: DC

## 2015-02-08 NOTE — Patient Instructions (Signed)
Fat and Cholesterol Control Diet Fat and cholesterol levels in your blood and organs are influenced by your diet. High levels of fat and cholesterol may lead to diseases of the heart, small and large blood vessels, gallbladder, liver, and pancreas. CONTROLLING FAT AND CHOLESTEROL WITH DIET Although exercise and lifestyle factors are important, your diet is key. That is because certain foods are known to raise cholesterol and others to lower it. The goal is to balance foods for their effect on cholesterol and more importantly, to replace saturated and trans fat with other types of fat, such as monounsaturated fat, polyunsaturated fat, and omega-3 fatty acids. On average, a person should consume no more than 15 to 17 g of saturated fat daily. Saturated and trans fats are considered "bad" fats, and they will raise LDL cholesterol. Saturated fats are primarily found in animal products such as meats, butter, and cream. However, that does not mean you need to give up all your favorite foods. Today, there are good tasting, low-fat, low-cholesterol substitutes for most of the things you like to eat. Choose low-fat or nonfat alternatives. Choose round or loin cuts of red meat. These types of cuts are lowest in fat and cholesterol. Chicken (without the skin), fish, veal, and ground turkey breast are great choices. Eliminate fatty meats, such as hot dogs and salami. Even shellfish have little or no saturated fat. Have a 3 oz (85 g) portion when you eat lean meat, poultry, or fish. Trans fats are also called "partially hydrogenated oils." They are oils that have been scientifically manipulated so that they are solid at room temperature resulting in a longer shelf life and improved taste and texture of foods in which they are added. Trans fats are found in stick margarine, some tub margarines, cookies, crackers, and baked goods.  When baking and cooking, oils are a great substitute for butter. The monounsaturated oils are  especially beneficial since it is believed they lower LDL and raise HDL. The oils you should avoid entirely are saturated tropical oils, such as coconut and palm.  Remember to eat a lot from food groups that are naturally free of saturated and trans fat, including fish, fruit, vegetables, beans, grains (barley, rice, couscous, bulgur wheat), and pasta (without cream sauces).  IDENTIFYING FOODS THAT LOWER FAT AND CHOLESTEROL  Soluble fiber may lower your cholesterol. This type of fiber is found in fruits such as apples, vegetables such as broccoli, potatoes, and carrots, legumes such as beans, peas, and lentils, and grains such as barley. Foods fortified with plant sterols (phytosterol) may also lower cholesterol. You should eat at least 2 g per day of these foods for a cholesterol lowering effect.  Read package labels to identify low-saturated fats, trans fat free, and low-fat foods at the supermarket. Select cheeses that have only 2 to 3 g saturated fat per ounce. Use a heart-healthy tub margarine that is free of trans fats or partially hydrogenated oil. When buying baked goods (cookies, crackers), avoid partially hydrogenated oils. Breads and muffins should be made from whole grains (whole-wheat or whole oat flour, instead of "flour" or "enriched flour"). Buy non-creamy canned soups with reduced salt and no added fats.  FOOD PREPARATION TECHNIQUES  Never deep-fry. If you must fry, either stir-fry, which uses very little fat, or use non-stick cooking sprays. When possible, broil, bake, or roast meats, and steam vegetables. Instead of putting butter or margarine on vegetables, use lemon and herbs, applesauce, and cinnamon (for squash and sweet potatoes). Use nonfat   yogurt, salsa, and low-fat dressings for salads.  LOW-SATURATED FAT / LOW-FAT FOOD SUBSTITUTES Meats / Saturated Fat (g)  Avoid: Steak, marbled (3 oz/85 g) / 11 g  Choose: Steak, lean (3 oz/85 g) / 4 g  Avoid: Hamburger (3 oz/85 g) / 7  g  Choose: Hamburger, lean (3 oz/85 g) / 5 g  Avoid: Ham (3 oz/85 g) / 6 g  Choose: Ham, lean cut (3 oz/85 g) / 2.4 g  Avoid: Chicken, with skin, dark meat (3 oz/85 g) / 4 g  Choose: Chicken, skin removed, dark meat (3 oz/85 g) / 2 g  Avoid: Chicken, with skin, light meat (3 oz/85 g) / 2.5 g  Choose: Chicken, skin removed, light meat (3 oz/85 g) / 1 g Dairy / Saturated Fat (g)  Avoid: Whole milk (1 cup) / 5 g  Choose: Low-fat milk, 2% (1 cup) / 3 g  Choose: Low-fat milk, 1% (1 cup) / 1.5 g  Choose: Skim milk (1 cup) / 0.3 g  Avoid: Hard cheese (1 oz/28 g) / 6 g  Choose: Skim milk cheese (1 oz/28 g) / 2 to 3 g  Avoid: Cottage cheese, 4% fat (1 cup) / 6.5 g  Choose: Low-fat cottage cheese, 1% fat (1 cup) / 1.5 g  Avoid: Ice cream (1 cup) / 9 g  Choose: Sherbet (1 cup) / 2.5 g  Choose: Nonfat frozen yogurt (1 cup) / 0.3 g  Choose: Frozen fruit bar / trace  Avoid: Whipped cream (1 tbs) / 3.5 g  Choose: Nondairy whipped topping (1 tbs) / 1 g Condiments / Saturated Fat (g)  Avoid: Mayonnaise (1 tbs) / 2 g  Choose: Low-fat mayonnaise (1 tbs) / 1 g  Avoid: Butter (1 tbs) / 7 g  Choose: Extra light margarine (1 tbs) / 1 g  Avoid: Coconut oil (1 tbs) / 11.8 g  Choose: Olive oil (1 tbs) / 1.8 g  Choose: Corn oil (1 tbs) / 1.7 g  Choose: Safflower oil (1 tbs) / 1.2 g  Choose: Sunflower oil (1 tbs) / 1.4 g  Choose: Soybean oil (1 tbs) / 2.4 g  Choose: Canola oil (1 tbs) / 1 g Document Released: 06/08/2005 Document Revised: 10/03/2012 Document Reviewed: 09/06/2013 [employer] Patient Information 2015 [employer]. This information is not intended to replace advice given to you by your health care provider. Make sure you discuss any questions you have with your health care provider.  

## 2015-02-08 NOTE — Progress Notes (Signed)
Subjective:    Patient ID: Priscilla Daniels, female    DOB: Apr 12, 1941, 74 y.o.   MRN: [id number]  Patient here today for follow up-  No changes since last visit. Her only compliant is dysuria with slight frequency- has this problem a lot- antibiotics usually will help.  Hyperlipidemia This is a chronic problem. The current episode started more than 1 year ago. The problem is controlled. Recent lipid tests were reviewed and are normal. She has no history of diabetes, hypothyroidism or obesity. Pertinent negatives include no chest pain or shortness of breath. Current antihyperlipidemic treatment includes statins. The current treatment provides moderate improvement of lipids. Compliance problems include adherence to diet and adherence to exercise.  Risk factors for coronary artery disease include dyslipidemia, hypertension and post-menopausal.  Hypertension This is a chronic problem. The current episode started more than 1 year ago. The problem is unchanged. The problem is controlled. Pertinent negatives include no chest pain, palpitations or shortness of breath. Risk factors for coronary artery disease include dyslipidemia, post-menopausal state and sedentary lifestyle. Past treatments include ACE inhibitors and diuretics. The current treatment provides moderate improvement. Compliance problems include diet and exercise.   Glaucoma/Macular degeneration Sees Dr. Georgia Lopes and Dr. Zadie Rhine   Review of Systems  Constitutional: Negative.   HENT: Negative.   Respiratory: Negative.  Negative for cough and shortness of breath.   Cardiovascular: Negative.  Negative for chest pain and palpitations.  Gastrointestinal: Negative.   Endocrine: Negative.   Genitourinary:       Pt states she "feels pressure" and doesn't feel like she urinating enough  Musculoskeletal: Negative.   Skin: Negative.   Allergic/Immunologic: Positive for environmental allergies.  Hematological: Negative.   Psychiatric/Behavioral:  Negative.        Objective:   Physical Exam  Constitutional: She is oriented to person, place, and time. She appears well-developed and well-nourished.  HENT:  Nose: Nose normal.  Mouth/Throat: Oropharynx is clear and moist.  Eyes: EOM are normal.  Neck: Trachea normal, normal range of motion and full passive range of motion without pain. Neck supple. No JVD present. Carotid bruit is not present. No thyromegaly present.  Cardiovascular: Normal rate, regular rhythm, normal heart sounds and intact distal pulses.  Exam reveals no gallop and no friction rub.   No murmur heard. Pulmonary/Chest: Effort normal and breath sounds normal.  Abdominal: Soft. Bowel sounds are normal. She exhibits no distension and no mass. There is no tenderness.  Musculoskeletal: Normal range of motion.  Lymphadenopathy:    She has no cervical adenopathy.  Neurological: She is alert and oriented to person, place, and time. She has normal reflexes.  Skin: Skin is warm and dry.  Psychiatric: She has a normal mood and affect. Her behavior is normal. Judgment and thought content normal.    BP 110/74 mmHg  Pulse 71  Temp(Src) 97.6 F (36.4 C) (Oral)  Ht 5' 7"  (1.702 m)  Wt 150 lb (68.04 kg)  BMI 23.49 kg/m2  Results for orders placed or performed in visit on 02/08/15  POCT urinalysis dipstick  Result Value Ref Range   Color, UA yellow    Clarity, UA cloudy    Glucose, UA neg    Bilirubin, UA neg    Ketones, UA neg    Spec Grav, UA 1.020    Blood, UA trace    pH, UA 5.0    Protein, UA neg    Urobilinogen, UA negative    Nitrite, UA pos    Leukocytes,  UA large (3+) (A) Negative  POCT UA - Microscopic Only  Result Value Ref Range   WBC, Ur, HPF, POC 40-50    RBC, urine, microscopic 5-10    Bacteria, U Microscopic mod    Mucus, UA neg    Epithelial cells, urine per micros occ    Crystals, Ur, HPF, POC neg    Casts, Ur, LPF, POC neg    Yeast, UA neg    Chest x ray- no cardiopulmonary  abnormalities-Preliminary reading by Ronnald Collum, FNP  [facility]   EKG- Kerry Hough, FNP       Assessment & Plan:   1. Dysuria - POCT urinalysis dipstick - POCT UA - Microscopic Only - Urine culture  2. Acute cystitis without hematuria Take medication as prescribe Cotton underwear Take shower not bath Cranberry juice, yogurt Force fluids AZO over the counter X2 days Culture pending RTO prn - ciprofloxacin (CIPRO) 500 MG tablet; Take 1 tablet (500 mg total) by mouth 2 (two) times daily.  Dispense: 10 tablet; Refill: 0  3. Essential hypertension Do not add salt to diet - CMP14+EGFR - lisinopril-hydrochlorothiazide (PRINZIDE,ZESTORETIC) 20-12.5 MG per tablet; Take 1 tablet by mouth daily.  Dispense: 90 tablet; Refill: 1 - DG Chest 2 View; Future - EKG 12-Lead  4. Hyperlipidemia with target LDL less than 100 Low fat diet - Lipid panel  5. Screening for malignant neoplasm of the rectum - Fecal occult blood, imunochemical; Future    Labs pending Health maintenance reviewed Diet and exercise encouraged Continue all meds Follow up  In 3 month   [place], FNP

## 2015-02-09 LAB — CMP14+EGFR
ALK PHOS: 51 IU/L (ref 39–117)
ALT: 10 IU/L (ref 0–32)
AST: 16 IU/L (ref 0–40)
Albumin/Globulin Ratio: 1.8 (ref 1.1–2.5)
Albumin: 4.4 g/dL (ref 3.5–4.8)
BUN/Creatinine Ratio: 29 — ABNORMAL HIGH (ref 11–26)
BUN: 28 mg/dL — ABNORMAL HIGH (ref 8–27)
Bilirubin Total: 0.2 mg/dL (ref 0.0–1.2)
CO2: 25 mmol/L (ref 18–29)
CREATININE: 0.98 mg/dL (ref 0.57–1.00)
Calcium: 9.7 mg/dL (ref 8.7–10.3)
Chloride: 103 mmol/L (ref 97–108)
GFR calc Af Amer: 66 mL/min/{1.73_m2} (ref >59)
GFR calc non Af Amer: 57 mL/min/{1.73_m2} — ABNORMAL LOW (ref >59)
GLOBULIN, TOTAL: 2.5 g/dL (ref 1.5–4.5)
Glucose: 107 mg/dL — ABNORMAL HIGH (ref 65–99)
Potassium: 4.4 mmol/L (ref 3.5–5.2)
SODIUM: 143 mmol/L (ref 134–144)
Total Protein: 6.9 g/dL (ref 6.0–8.5)

## 2015-02-09 LAB — LIPID PANEL
Chol/HDL Ratio: 2.3 ratio units (ref 0.0–4.4)
Cholesterol, Total: 214 mg/dL — ABNORMAL HIGH (ref 100–199)
HDL: 92 mg/dL (ref >39)
LDL CALC: 104 mg/dL — AB (ref 0–99)
TRIGLYCERIDES: 91 mg/dL (ref 0–149)
VLDL Cholesterol Cal: 18 mg/dL (ref 5–40)

## 2015-02-10 LAB — URINE CULTURE

## 2015-02-11 ENCOUNTER — Encounter: Payer: Self-pay | Admitting: Nurse Practitioner

## 2015-02-11 DIAGNOSIS — N183 Chronic kidney disease, stage 3 unspecified: Secondary | ICD-10-CM | POA: Insufficient documentation

## 2015-02-15 ENCOUNTER — Other Ambulatory Visit: Payer: Self-pay | Admitting: Nurse Practitioner

## 2015-04-09 DIAGNOSIS — H34812 Central retinal vein occlusion, left eye, with macular edema: Secondary | ICD-10-CM | POA: Diagnosis not present

## 2015-04-19 ENCOUNTER — Ambulatory Visit (INDEPENDENT_AMBULATORY_CARE_PROVIDER_SITE_OTHER): Payer: Medicare Other

## 2015-04-19 DIAGNOSIS — Z23 Encounter for immunization: Secondary | ICD-10-CM

## 2015-05-13 DIAGNOSIS — H4010X1 Unspecified open-angle glaucoma, mild stage: Secondary | ICD-10-CM | POA: Diagnosis not present

## 2015-05-13 DIAGNOSIS — H524 Presbyopia: Secondary | ICD-10-CM | POA: Diagnosis not present

## 2015-05-13 DIAGNOSIS — H34812 Central retinal vein occlusion, left eye, with macular edema: Secondary | ICD-10-CM | POA: Diagnosis not present

## 2015-05-28 ENCOUNTER — Telehealth: Payer: Self-pay | Admitting: Nurse Practitioner

## 2015-06-25 DIAGNOSIS — H35352 Cystoid macular degeneration, left eye: Secondary | ICD-10-CM | POA: Diagnosis not present

## 2015-06-25 DIAGNOSIS — H34812 Central retinal vein occlusion, left eye, with macular edema: Secondary | ICD-10-CM | POA: Diagnosis not present

## 2015-06-25 DIAGNOSIS — H35042 Retinal micro-aneurysms, unspecified, left eye: Secondary | ICD-10-CM | POA: Diagnosis not present

## 2015-06-25 DIAGNOSIS — H43813 Vitreous degeneration, bilateral: Secondary | ICD-10-CM | POA: Diagnosis not present

## 2015-06-25 DIAGNOSIS — H43812 Vitreous degeneration, left eye: Secondary | ICD-10-CM | POA: Diagnosis not present

## 2015-08-19 ENCOUNTER — Encounter: Payer: Self-pay | Admitting: Nurse Practitioner

## 2015-08-19 ENCOUNTER — Ambulatory Visit (INDEPENDENT_AMBULATORY_CARE_PROVIDER_SITE_OTHER): Payer: Medicare Other | Admitting: Nurse Practitioner

## 2015-08-19 VITALS — BP 117/73 | HR 62 | Temp 96.8°F | Ht 67.0 in | Wt 154.0 lb

## 2015-08-19 DIAGNOSIS — I700 Atherosclerosis of aorta: Secondary | ICD-10-CM

## 2015-08-19 DIAGNOSIS — E785 Hyperlipidemia, unspecified: Secondary | ICD-10-CM | POA: Diagnosis not present

## 2015-08-19 DIAGNOSIS — I10 Essential (primary) hypertension: Secondary | ICD-10-CM

## 2015-08-19 DIAGNOSIS — R300 Dysuria: Secondary | ICD-10-CM | POA: Diagnosis not present

## 2015-08-19 DIAGNOSIS — N3090 Cystitis, unspecified without hematuria: Secondary | ICD-10-CM | POA: Diagnosis not present

## 2015-08-19 DIAGNOSIS — N183 Chronic kidney disease, stage 3 unspecified: Secondary | ICD-10-CM

## 2015-08-19 LAB — POCT UA - MICROSCOPIC ONLY
Casts, Ur, LPF, POC: NEGATIVE
Crystals, Ur, HPF, POC: NEGATIVE
Mucus, UA: NEGATIVE
Yeast, UA: NEGATIVE

## 2015-08-19 LAB — POCT URINALYSIS DIPSTICK
Bilirubin, UA: NEGATIVE
GLUCOSE UA: NEGATIVE
Ketones, UA: NEGATIVE
NITRITE UA: POSITIVE
PH UA: 6
Spec Grav, UA: 1.015
UROBILINOGEN UA: NEGATIVE

## 2015-08-19 LAB — POCT GLYCOSYLATED HEMOGLOBIN (HGB A1C): Hemoglobin A1C: 6.3

## 2015-08-19 MED ORDER — CIPROFLOXACIN HCL 500 MG PO TABS: 500.0000 mg | ORAL_TABLET | Freq: Two times a day (BID) | ORAL | Status: DC

## 2015-08-19 MED ORDER — LISINOPRIL-HYDROCHLOROTHIAZIDE 20-12.5 MG PO TABS: 1.0000 | ORAL_TABLET | Freq: Every day | ORAL | Status: DC

## 2015-08-19 NOTE — Addendum Note (Signed)
Addended by: Bennie Pierini on: 08/19/2015 10:03 AM   Modules accepted: Kipp Brood

## 2015-08-19 NOTE — Progress Notes (Addendum)
Subjective:    Patient ID: Priscilla Daniels, female    DOB: [birth year], 75 y.o.   MRN: [id number]  Patient here today for follow up of chronic medical problems.  Outpatient Encounter Prescriptions as of 08/19/2015  Medication Sig  . aspirin (ASPIRIN LOW DOSE) 81 MG EC tablet Take 81 mg by mouth daily.    . cholecalciferol (VITAMIN D) 1000 UNITS tablet Take 1,000 Units by mouth daily.    . ciprofloxacin (CIPRO) 500 MG tablet Take 1 tablet (500 mg total) by mouth 2 (two) times daily.  . DORZOLAMIDE HCL OP Apply to eye. Apply one drop to each eye two times a day      . dorzolamide-timolol (COSOPT) 22.3-6.8 MG/ML ophthalmic solution   . lisinopril-hydrochlorothiazide (PRINZIDE,ZESTORETIC) 20-12.5 MG per tablet Take 1 tablet by mouth daily.  Dorette Grate Z 0.004 % SOLN ophthalmic solution    No facility-administered encounter medications on file as of 08/19/2015.      Hyperlipidemia This is a chronic problem. The current episode started more than 1 year ago. The problem is controlled. Recent lipid tests were reviewed and are normal. She has no history of diabetes, hypothyroidism or obesity. Pertinent negatives include no chest pain or shortness of breath. Current antihyperlipidemic treatment includes statins. The current treatment provides moderate improvement of lipids. Compliance problems include adherence to diet and adherence to exercise.  Risk factors for coronary artery disease include dyslipidemia, hypertension and post-menopausal.  Hypertension This is a chronic problem. The current episode started more than 1 year ago. The problem is unchanged. The problem is controlled. Pertinent negatives include no chest pain, palpitations or shortness of breath. Risk factors for coronary artery disease include dyslipidemia, post-menopausal state and sedentary lifestyle. Past treatments include ACE inhibitors and diuretics. The current treatment provides moderate improvement. Compliance problems include  diet and exercise.   Glaucoma/Macular degeneration Sees Dr. Georgia Lopes and Dr. Zadie Rhine CKD stage III Currently watching labs  * Patient had elevated blood sugars in past and was on metformin- does not have dx of diabetes and is no longer on metformin.    Review of Systems  Constitutional: Negative.   HENT: Negative.   Respiratory: Negative.  Negative for cough and shortness of breath.   Cardiovascular: Negative.  Negative for chest pain and palpitations.  Gastrointestinal: Negative.   Endocrine: Negative.   Genitourinary:       Pt states she "feels pressure" and doesn't feel like she urinating enough  Musculoskeletal: Negative.   Skin: Negative.   Allergic/Immunologic: Positive for environmental allergies.  Hematological: Negative.   Psychiatric/Behavioral: Negative.        Objective:   Physical Exam  Constitutional: She is oriented to person, place, and time. She appears well-developed and well-nourished.  HENT:  Nose: Nose normal.  Mouth/Throat: Oropharynx is clear and moist.  Eyes: EOM are normal.  Neck: Trachea normal, normal range of motion and full passive range of motion without pain. Neck supple. No JVD present. Carotid bruit is not present. No thyromegaly present.  Cardiovascular: Normal rate, regular rhythm, normal heart sounds and intact distal pulses.  Exam reveals no gallop and no friction rub.   No murmur heard. Pulmonary/Chest: Effort normal and breath sounds normal.  Abdominal: Soft. Bowel sounds are normal. She exhibits no distension and no mass. There is no tenderness.  Musculoskeletal: Normal range of motion.  Lymphadenopathy:    She has no cervical adenopathy.  Neurological: She is alert and oriented to person, place, and time. She has normal reflexes.  Skin: Skin is warm and dry.  Psychiatric: She has a normal mood and affect. Her behavior is normal. Judgment and thought content normal.   BP 117/73 mmHg  Pulse 62  Temp(Src) 96.8 F (36 C) (Oral)  Ht  5' 7"  (1.702 m)  Wt 154 lb (69.854 kg)  BMI 24.11 kg/m2     Assessment & Plan:   1. Essential hypertension Do not add salt o diet - CMP14+EGFR - lisinopril-hydrochlorothiazide (PRINZIDE,ZESTORETIC) 20-12.5 MG tablet; Take 1 tablet by mouth daily.  Dispense: 90 tablet; Refill: 1  2. Hyperlipidemia with target LDL less than 100 Low fat diet - POCT glycosylated hemoglobin (Hb A1C) - Lipid panel  3. Dysuria/Actue cystitis Take medication as prescribe Cotton underwear Take shower not bath Cranberry juice, yogurt Force fluids AZO over the counter X2 days Culture pending RTO prn - cipro 549m 1 po bid x 5 days #10  0 refills - POCT urinalysis dipstick - POCT UA - Microscopic Only  4. [place] with cardiology  5. CKD (chronic kidney disease), stage III   Patient refuses all health maintenance Labs pending Health maintenance reviewed Diet and exercise encouraged Continue all meds Follow up  In 6 months   [place] FNP

## 2015-08-19 NOTE — Patient Instructions (Signed)

## 2015-08-20 LAB — CMP14+EGFR
A/G RATIO: 1.6 (ref 1.1–2.5)
ALT: 17 IU/L (ref 0–32)
AST: 18 IU/L (ref 0–40)
Albumin: 4.1 g/dL (ref 3.5–4.8)
Alkaline Phosphatase: 56 IU/L (ref 39–117)
BILIRUBIN TOTAL: 0.2 mg/dL (ref 0.0–1.2)
BUN/Creatinine Ratio: 30 — ABNORMAL HIGH (ref 11–26)
BUN: 31 mg/dL — AB (ref 8–27)
CALCIUM: 9.3 mg/dL (ref 8.7–10.3)
CHLORIDE: 99 mmol/L (ref 96–106)
CO2: 26 mmol/L (ref 18–29)
Creatinine, Ser: 1.02 mg/dL — ABNORMAL HIGH (ref 0.57–1.00)
GFR calc Af Amer: 63 mL/min/{1.73_m2} (ref >59)
GFR, EST NON AFRICAN AMERICAN: 54 mL/min/{1.73_m2} — AB (ref >59)
GLUCOSE: 110 mg/dL — AB (ref 65–99)
Globulin, Total: 2.6 g/dL (ref 1.5–4.5)
POTASSIUM: 4.6 mmol/L (ref 3.5–5.2)
Sodium: 137 mmol/L (ref 134–144)
TOTAL PROTEIN: 6.7 g/dL (ref 6.0–8.5)

## 2015-08-20 LAB — LIPID PANEL
Chol/HDL Ratio: 2.4 ratio units (ref 0.0–4.4)
Cholesterol, Total: 235 mg/dL — ABNORMAL HIGH (ref 100–199)
HDL: 96 mg/dL (ref >39)
LDL Calculated: 119 mg/dL — ABNORMAL HIGH (ref 0–99)
TRIGLYCERIDES: 101 mg/dL (ref 0–149)
VLDL CHOLESTEROL CAL: 20 mg/dL (ref 5–40)

## 2015-08-21 ENCOUNTER — Telehealth: Payer: Self-pay | Admitting: Nurse Practitioner

## 2015-08-21 NOTE — Progress Notes (Signed)
Patient aware.

## 2015-08-23 NOTE — Telephone Encounter (Signed)
Copy of labs mailed to Pt. Address.

## 2015-08-24 LAB — URINE CULTURE

## 2015-08-29 ENCOUNTER — Ambulatory Visit: Payer: Medicare Other | Admitting: Nurse Practitioner

## 2015-09-06 DIAGNOSIS — H401131 Primary open-angle glaucoma, bilateral, mild stage: Secondary | ICD-10-CM | POA: Diagnosis not present

## 2015-09-10 DIAGNOSIS — H34812 Central retinal vein occlusion, left eye, with macular edema: Secondary | ICD-10-CM | POA: Diagnosis not present

## 2015-11-13 DIAGNOSIS — H401111 Primary open-angle glaucoma, right eye, mild stage: Secondary | ICD-10-CM | POA: Diagnosis not present

## 2015-11-13 DIAGNOSIS — H401132 Primary open-angle glaucoma, bilateral, moderate stage: Secondary | ICD-10-CM | POA: Diagnosis not present

## 2015-11-13 DIAGNOSIS — H401122 Primary open-angle glaucoma, left eye, moderate stage: Secondary | ICD-10-CM | POA: Diagnosis not present

## 2015-12-03 DIAGNOSIS — H34812 Central retinal vein occlusion, left eye, with macular edema: Secondary | ICD-10-CM | POA: Diagnosis not present

## 2016-01-31 DIAGNOSIS — Z1231 Encounter for screening mammogram for malignant neoplasm of breast: Secondary | ICD-10-CM | POA: Diagnosis not present

## 2016-02-17 DIAGNOSIS — H401131 Primary open-angle glaucoma, bilateral, mild stage: Secondary | ICD-10-CM | POA: Diagnosis not present

## 2016-02-21 ENCOUNTER — Ambulatory Visit (INDEPENDENT_AMBULATORY_CARE_PROVIDER_SITE_OTHER): Payer: Medicare Other | Admitting: Nurse Practitioner

## 2016-02-21 ENCOUNTER — Encounter: Payer: Self-pay | Admitting: Nurse Practitioner

## 2016-02-21 VITALS — BP 122/70 | HR 57 | Temp 96.7°F | Ht 67.0 in | Wt 162.0 lb

## 2016-02-21 DIAGNOSIS — I10 Essential (primary) hypertension: Secondary | ICD-10-CM | POA: Diagnosis not present

## 2016-02-21 DIAGNOSIS — E785 Hyperlipidemia, unspecified: Secondary | ICD-10-CM

## 2016-02-21 DIAGNOSIS — R300 Dysuria: Secondary | ICD-10-CM

## 2016-02-21 DIAGNOSIS — N183 Chronic kidney disease, stage 3 unspecified: Secondary | ICD-10-CM

## 2016-02-21 DIAGNOSIS — I700 Atherosclerosis of aorta: Secondary | ICD-10-CM

## 2016-02-21 DIAGNOSIS — N3000 Acute cystitis without hematuria: Secondary | ICD-10-CM | POA: Diagnosis not present

## 2016-02-21 DIAGNOSIS — E8881 Metabolic syndrome: Secondary | ICD-10-CM | POA: Diagnosis not present

## 2016-02-21 DIAGNOSIS — E119 Type 2 diabetes mellitus without complications: Secondary | ICD-10-CM | POA: Diagnosis not present

## 2016-02-21 LAB — CMP14+EGFR
A/G RATIO: 1.5 (ref 1.2–2.2)
ALT: 14 IU/L (ref 0–32)
AST: 19 IU/L (ref 0–40)
Albumin: 4.1 g/dL (ref 3.5–4.8)
Alkaline Phosphatase: 53 IU/L (ref 39–117)
BUN/Creatinine Ratio: 28 (ref 12–28)
BUN: 27 mg/dL (ref 8–27)
Bilirubin Total: <0.2 mg/dL (ref 0.0–1.2)
CALCIUM: 9.4 mg/dL (ref 8.7–10.3)
CO2: 23 mmol/L (ref 18–29)
CREATININE: 0.96 mg/dL (ref 0.57–1.00)
Chloride: 100 mmol/L (ref 96–106)
GFR, EST AFRICAN AMERICAN: 67 mL/min/{1.73_m2} (ref >59)
GFR, EST NON AFRICAN AMERICAN: 58 mL/min/{1.73_m2} — AB (ref >59)
GLOBULIN, TOTAL: 2.7 g/dL (ref 1.5–4.5)
Glucose: 106 mg/dL — ABNORMAL HIGH (ref 65–99)
POTASSIUM: 4.6 mmol/L (ref 3.5–5.2)
SODIUM: 139 mmol/L (ref 134–144)
TOTAL PROTEIN: 6.8 g/dL (ref 6.0–8.5)

## 2016-02-21 LAB — LIPID PANEL
CHOL/HDL RATIO: 2.2 ratio (ref 0.0–4.4)
Cholesterol, Total: 207 mg/dL — ABNORMAL HIGH (ref 100–199)
HDL: 94 mg/dL (ref >39)
LDL CALC: 97 mg/dL (ref 0–99)
Triglycerides: 81 mg/dL (ref 0–149)
VLDL Cholesterol Cal: 16 mg/dL (ref 5–40)

## 2016-02-21 LAB — URINALYSIS, COMPLETE
Bilirubin, UA: NEGATIVE
GLUCOSE, UA: NEGATIVE
KETONES UA: NEGATIVE
NITRITE UA: POSITIVE — AB
PROTEIN UA: NEGATIVE
SPEC GRAV UA: 1.02 (ref 1.005–1.030)
Urobilinogen, Ur: 0.2 mg/dL (ref 0.2–1.0)
pH, UA: 5.5 (ref 5.0–7.5)

## 2016-02-21 LAB — MICROSCOPIC EXAMINATION
Epithelial Cells (non renal): >10 /hpf — AB (ref 0–10)
WBC, UA: >30 /hpf — AB (ref 0–?)

## 2016-02-21 LAB — BAYER DCA HB A1C WAIVED: HB A1C (BAYER DCA - WAIVED): 6.5 % (ref <7.0)

## 2016-02-21 MED ORDER — CIPROFLOXACIN HCL 500 MG PO TABS: 500.0000 mg | ORAL_TABLET | Freq: Two times a day (BID) | ORAL | 0 refills | Status: DC

## 2016-02-21 MED ORDER — LISINOPRIL-HYDROCHLOROTHIAZIDE 20-12.5 MG PO TABS: 1.0000 | ORAL_TABLET | Freq: Every day | ORAL | 1 refills | Status: DC

## 2016-02-21 NOTE — Patient Instructions (Signed)

## 2016-02-21 NOTE — Progress Notes (Signed)
Subjective:    Patient ID: Priscilla Daniels, female    DOB: September 21, 1940, 75 y.o.   MRN: [id number]  Patient here today for follow up of chronic medical problems.  Outpatient Encounter Prescriptions as of 02/21/2016  Medication Sig  . aspirin (ASPIRIN LOW DOSE) 81 MG EC tablet Take 81 mg by mouth daily.    . cholecalciferol (VITAMIN D) 1000 UNITS tablet Take 1,000 Units by mouth daily.    . ciprofloxacin (CIPRO) 500 MG tablet Take 1 tablet (500 mg total) by mouth 2 (two) times daily.  . dorzolamide-timolol (COSOPT) 22.3-6.8 MG/ML ophthalmic solution   . lisinopril-hydrochlorothiazide (PRINZIDE,ZESTORETIC) 20-12.5 MG tablet Take 1 tablet by mouth daily.  Dorette Grate Z 0.004 % SOLN ophthalmic solution    No facility-administered encounter medications on file as of 02/21/2016.     * Patient had couple of days earlier this week with dysuria and wants urine checked while here.  Hyperlipidemia  This is a chronic problem. The current episode started more than 1 year ago. The problem is controlled. Recent lipid tests were reviewed and are normal. She has no history of diabetes, hypothyroidism or obesity. Pertinent negatives include no chest pain or shortness of breath. Current antihyperlipidemic treatment includes statins. The current treatment provides moderate improvement of lipids. Compliance problems include adherence to diet and adherence to exercise.  Risk factors for coronary artery disease include dyslipidemia, hypertension and post-menopausal.  Hypertension  This is a chronic problem. The current episode started more than 1 year ago. The problem is unchanged. The problem is controlled. Pertinent negatives include no chest pain, palpitations or shortness of breath. Risk factors for coronary artery disease include dyslipidemia, post-menopausal state and sedentary lifestyle. Past treatments include ACE inhibitors and diuretics. The current treatment provides moderate improvement. Compliance problems  include diet and exercise.   Glaucoma/Macular degeneration Sees Dr. Georgia Lopes and Dr. Zadie Rhine CKD stage III Currently watching labs Metabolic syndrome Patient is currently not on any meds- she is suppose to be watching diet.  Review of Systems  Constitutional: Negative.   HENT: Negative.   Respiratory: Negative.  Negative for cough and shortness of breath.   Cardiovascular: Negative.  Negative for chest pain and palpitations.  Gastrointestinal: Negative.   Endocrine: Negative.   Genitourinary: Positive for dysuria. Negative for frequency, pelvic pain and urgency.       Pt states she "feels pressure" and doesn't feel like she urinating enough  Musculoskeletal: Negative.   Skin: Negative.   Allergic/Immunologic: Positive for environmental allergies.  Hematological: Negative.   Psychiatric/Behavioral: Negative.        Objective:   Physical Exam  Constitutional: She is oriented to person, place, and time. She appears well-developed and well-nourished.  HENT:  Nose: Nose normal.  Mouth/Throat: Oropharynx is clear and moist.  Eyes: EOM are normal.  Neck: Trachea normal, normal range of motion and full passive range of motion without pain. Neck supple. No JVD present. Carotid bruit is not present. No thyromegaly present.  Cardiovascular: Normal rate, regular rhythm, normal heart sounds and intact distal pulses.  Exam reveals no gallop and no friction rub.   No murmur heard. Varicose veins bil lower ext.  Pulmonary/Chest: Effort normal and breath sounds normal.  Abdominal: Soft. Bowel sounds are normal. She exhibits no distension and no mass. There is no tenderness.  Musculoskeletal: Normal range of motion.  Lymphadenopathy:    She has no cervical adenopathy.  Neurological: She is alert and oriented to person, place, and time. She has normal reflexes.  Skin: Skin is warm and dry.  Psychiatric: She has a normal mood and affect. Her behavior is normal. Judgment and thought content  normal.   BP 122/70   Pulse (!) 57   Temp (!) 96.7 F (35.9 C) (Oral)   Ht 5' 7"  (1.702 m)   Wt 162 lb (73.5 kg)   BMI 25.37 kg/m       Assessment & Plan:  1. Essential hypertension Do not add salt to diet - CMP14+EGFR - lisinopril-hydrochlorothiazide (PRINZIDE,ZESTORETIC) 20-12.5 MG tablet; Take 1 tablet by mouth daily.  Dispense: 90 tablet; Refill: 1  2. Hyperlipidemia with target LDL less than 100 Low fat diet - Lipid panel  3. CKD (chronic kidney disease), stage III Will continue to watch labs  4. Atherosclerosis of aorta ([place])  5. Metabolic syndrome watch crabs in diet - Bayer DCA Hb A1c Waived - Microalbumin / creatinine urine ratio  6. Dysuria - Urinalysis, Complete  7. Acute cystitis without hematuria Take medication as prescribe Cotton underwear Take shower not bath Cranberry juice, yogurt Force fluids AZO over the counter X2 days Culture pending RTO prn - Urine culture - ciprofloxacin (CIPRO) 500 MG tablet; Take 1 tablet (500 mg total) by mouth 2 (two) times daily.  Dispense: 20 tablet; Refill: 0    Labs pending Health maintenance reviewed Diet and exercise encouraged Continue all meds Follow up  In 6 months   [place], FNP

## 2016-02-22 LAB — MICROALBUMIN / CREATININE URINE RATIO
CREATININE, UR: 76.6 mg/dL
MICROALB/CREAT RATIO: 13.6 mg/g creat (ref 0.0–30.0)
MICROALBUM., U, RANDOM: 10.4 ug/mL

## 2016-02-23 LAB — URINE CULTURE

## 2016-02-25 ENCOUNTER — Telehealth: Payer: Self-pay | Admitting: Nurse Practitioner

## 2016-02-25 DIAGNOSIS — H34812 Central retinal vein occlusion, left eye, with macular edema: Secondary | ICD-10-CM | POA: Diagnosis not present

## 2016-04-17 ENCOUNTER — Ambulatory Visit (INDEPENDENT_AMBULATORY_CARE_PROVIDER_SITE_OTHER): Payer: Medicare Other

## 2016-04-17 DIAGNOSIS — Z23 Encounter for immunization: Secondary | ICD-10-CM

## 2016-05-19 ENCOUNTER — Encounter: Payer: Self-pay | Admitting: Nurse Practitioner

## 2016-05-19 ENCOUNTER — Ambulatory Visit (INDEPENDENT_AMBULATORY_CARE_PROVIDER_SITE_OTHER): Payer: Medicare Other | Admitting: Nurse Practitioner

## 2016-05-19 VITALS — BP 132/84 | HR 66 | Temp 96.8°F | Ht 67.0 in | Wt 164.0 lb

## 2016-05-19 DIAGNOSIS — N183 Chronic kidney disease, stage 3 unspecified: Secondary | ICD-10-CM

## 2016-05-19 DIAGNOSIS — I700 Atherosclerosis of aorta: Secondary | ICD-10-CM

## 2016-05-19 DIAGNOSIS — E785 Hyperlipidemia, unspecified: Secondary | ICD-10-CM

## 2016-05-19 DIAGNOSIS — E8881 Metabolic syndrome: Secondary | ICD-10-CM | POA: Diagnosis not present

## 2016-05-19 DIAGNOSIS — I10 Essential (primary) hypertension: Secondary | ICD-10-CM | POA: Diagnosis not present

## 2016-05-19 DIAGNOSIS — H409 Unspecified glaucoma: Secondary | ICD-10-CM | POA: Diagnosis not present

## 2016-05-19 NOTE — Patient Instructions (Signed)
Fall Prevention in the Home Introduction Falls can cause injuries. They can happen to people of all ages. There are many things you can do to make your home safe and to help prevent falls. What can I do on the outside of my home?  Regularly fix the edges of walkways and driveways and fix any cracks.  Remove anything that might make you trip as you walk through a door, such as a raised step or threshold.  Trim any bushes or trees on the path to your home.  Use bright outdoor lighting.  Clear any walking paths of anything that might make someone trip, such as rocks or tools.  Regularly check to see if handrails are loose or broken. Make sure that both sides of any steps have handrails.  Any raised decks and porches should have guardrails on the edges.  Have any leaves, snow, or ice cleared regularly.  Use sand or salt on walking paths during winter.  Clean up any spills in your garage right away. This includes oil or grease spills. What can I do in the bathroom?  Use night lights.  Install grab bars by the toilet and in the tub and shower. Do not use towel bars as grab bars.  Use non-skid mats or decals in the tub or shower.  If you need to sit down in the shower, use a plastic, non-slip stool.  Keep the floor dry. Clean up any water that spills on the floor as soon as it happens.  Remove soap buildup in the tub or shower regularly.  Attach bath mats securely with double-sided non-slip rug tape.  Do not have throw rugs and other things on the floor that can make you trip. What can I do in the bedroom?  Use night lights.  Make sure that you have a light by your bed that is easy to reach.  Do not use any sheets or blankets that are too big for your bed. They should not hang down onto the floor.  Have a firm chair that has side arms. You can use this for support while you get dressed.  Do not have throw rugs and other things on the floor that can make you trip. What can  I do in the kitchen?  Clean up any spills right away.  Avoid walking on wet floors.  Keep items that you use a lot in easy-to-reach places.  If you need to reach something above you, use a strong step stool that has a grab bar.  Keep electrical cords out of the way.  Do not use floor polish or wax that makes floors slippery. If you must use wax, use non-skid floor wax.  Do not have throw rugs and other things on the floor that can make you trip. What can I do with my stairs?  Do not leave any items on the stairs.  Make sure that there are handrails on both sides of the stairs and use them. Fix handrails that are broken or loose. Make sure that handrails are as long as the stairways.  Check any carpeting to make sure that it is firmly attached to the stairs. Fix any carpet that is loose or worn.  Avoid having throw rugs at the top or bottom of the stairs. If you do have throw rugs, attach them to the floor with carpet tape.  Make sure that you have a light switch at the top of the stairs and the bottom of the stairs. If you   do not have them, ask someone to add them for you. What else can I do to help prevent falls?  Wear shoes that:  Do not have high heels.  Have rubber bottoms.  Are comfortable and fit you well.  Are closed at the toe. Do not wear sandals.  If you use a stepladder:  Make sure that it is fully opened. Do not climb a closed stepladder.  Make sure that both sides of the stepladder are locked into place.  Ask someone to hold it for you, if possible.  Clearly mark and make sure that you can see:  Any grab bars or handrails.  First and last steps.  Where the edge of each step is.  Use tools that help you move around (mobility aids) if they are needed. These include:  Canes.  Walkers.  Scooters.  Crutches.  Turn on the lights when you go into a dark area. Replace any light bulbs as soon as they burn out.  Set up your furniture so you have a  clear path. Avoid moving your furniture around.  If any of your floors are uneven, fix them.  If there are any pets around you, be aware of where they are.  Review your medicines with your doctor. Some medicines can make you feel dizzy. This can increase your chance of falling. Ask your doctor what other things that you can do to help prevent falls. This information is not intended to replace advice given to you by your health care provider. Make sure you discuss any questions you have with your health care provider. Document Released: 04/04/2009 Document Revised: 11/14/2015 Document Reviewed: 07/13/2014  2017 [employer]  

## 2016-05-19 NOTE — Addendum Note (Signed)
Addended by: Bennie PieriniMARTIN, MARY-MARGARET on: 05/19/2016 09:00 AM   Modules accepted: Orders

## 2016-05-19 NOTE — Progress Notes (Addendum)
Subjective:    Patient ID: Priscilla Daniels, female    DOB: 28-Oct-1940, 75 y.o.   MRN: [id number]  Patient here today for follow up of chronic medical problems. NO changes since last visit. No complaints today.  Outpatient Encounter Prescriptions as of 05/19/2016  Medication Sig  . aspirin (ASPIRIN LOW DOSE) 81 MG EC tablet Take 81 mg by mouth daily.    . dorzolamide-timolol (COSOPT) 22.3-6.8 MG/ML ophthalmic solution   . lisinopril-hydrochlorothiazide (PRINZIDE,ZESTORETIC) 20-12.5 MG tablet Take 1 tablet by mouth daily.  . TRAVATAN Z 0.004 % SOLN ophthalmic solution   . cholecalciferol (VITAMIN D) 1000 UNITS tablet Take 1,000 Units by mouth daily.     * Patient has not taken her blood pressure meds this morning.  Hyperlipidemia  This is a chronic problem. The current episode started more than 1 year ago. The problem is controlled. Recent lipid tests were reviewed and are normal. She has no history of diabetes, hypothyroidism or obesity. Pertinent negatives include no chest pain or shortness of breath. Current antihyperlipidemic treatment includes statins. The current treatment provides moderate improvement of lipids. Compliance problems include adherence to diet and adherence to exercise.  Risk factors for coronary artery disease include dyslipidemia, hypertension and post-menopausal.  Hypertension  This is a chronic problem. The current episode started more than 1 year ago. The problem is unchanged. The problem is controlled. Pertinent negatives include no chest pain, palpitations or shortness of breath. Risk factors for coronary artery disease include dyslipidemia, post-menopausal state and sedentary lifestyle. Past treatments include ACE inhibitors and diuretics. The current treatment provides moderate improvement. Compliance problems include diet and exercise.   Glaucoma/Macular degeneration Sees Dr. Georgia Lopes and Dr. Zadie Rhine- has follow up appointment every 3 months. CKD stage III Currently  watching labs Metabolic syndrome Patient is currently not on any meds- she is suppose to be watching diet.  Review of Systems  Constitutional: Negative.   HENT: Negative.   Respiratory: Negative.  Negative for cough and shortness of breath.   Cardiovascular: Negative.  Negative for chest pain and palpitations.  Gastrointestinal: Negative.   Endocrine: Negative.   Genitourinary: Positive for dysuria. Negative for frequency, pelvic pain and urgency.       Pt states she "feels pressure" and doesn't feel like she urinating enough  Musculoskeletal: Negative.   Skin: Negative.   Allergic/Immunologic: Positive for environmental allergies.  Hematological: Negative.   Psychiatric/Behavioral: Negative.        Objective:   Physical Exam  Constitutional: She is oriented to person, place, and time. She appears well-developed and well-nourished.  HENT:  Nose: Nose normal.  Mouth/Throat: Oropharynx is clear and moist.  Eyes: EOM are normal.  Neck: Trachea normal, normal range of motion and full passive range of motion without pain. Neck supple. No JVD present. Carotid bruit is not present. No thyromegaly present.  Cardiovascular: Normal rate, regular rhythm, normal heart sounds and intact distal pulses.  Exam reveals no gallop and no friction rub.   No murmur heard. Varicose veins bil lower ext.  Pulmonary/Chest: Effort normal and breath sounds normal.  Abdominal: Soft. Bowel sounds are normal. She exhibits no distension and no mass. There is no tenderness.  Musculoskeletal: Normal range of motion.  Lymphadenopathy:    She has no cervical adenopathy.  Neurological: She is alert and oriented to person, place, and time. She has normal reflexes.  Skin: Skin is warm and dry.  Psychiatric: She has a normal mood and affect. Her behavior is normal. Judgment and thought  content normal.    BP 132/84 (BP Location: Left Arm, Cuff Size: Normal)   Pulse 66   Temp (!) 96.8 F (36 C) (Oral)   Ht 5'  7" (1.702 m)   Wt 164 lb (74.4 kg)   BMI 25.69 kg/m        Assessment & Plan:  1. Atherosclerosis of aorta ([place])  2. Essential hypertension Low sodium diet  3. CKD (chronic kidney disease), stage III Avoid NSAIDS  4. Hyperlipidemia with target LDL less than 100 Low fat diet  5. Metabolic syndrome Watch carbs in diet  6. Glaucoma of both eyes, unspecified glaucoma type Keep follow up appointments with eye doctor.  Orders Placed This Encounter  Procedures  . CMP14+EGFR  . Lipid panel     Labs pending Health maintenance reviewed Diet and exercise encouraged Continue all meds Follow up  In 3 months   [place], FNP

## 2016-05-20 LAB — LIPID PANEL
CHOL/HDL RATIO: 2.3 ratio (ref 0.0–4.4)
CHOLESTEROL TOTAL: 221 mg/dL — AB (ref 100–199)
HDL: 96 mg/dL (ref >39)
LDL Calculated: 108 mg/dL — ABNORMAL HIGH (ref 0–99)
TRIGLYCERIDES: 84 mg/dL (ref 0–149)
VLDL Cholesterol Cal: 17 mg/dL (ref 5–40)

## 2016-05-20 LAB — CMP14+EGFR
A/G RATIO: 1.6 (ref 1.2–2.2)
ALK PHOS: 58 IU/L (ref 39–117)
ALT: 11 IU/L (ref 0–32)
AST: 18 IU/L (ref 0–40)
Albumin: 4.3 g/dL (ref 3.5–4.8)
BUN / CREAT RATIO: 24 (ref 12–28)
BUN: 21 mg/dL (ref 8–27)
Bilirubin Total: 0.3 mg/dL (ref 0.0–1.2)
CO2: 25 mmol/L (ref 18–29)
Calcium: 9.5 mg/dL (ref 8.7–10.3)
Chloride: 101 mmol/L (ref 96–106)
Creatinine, Ser: 0.88 mg/dL (ref 0.57–1.00)
GFR calc Af Amer: 74 mL/min/{1.73_m2} (ref >59)
GFR calc non Af Amer: 64 mL/min/{1.73_m2} (ref >59)
GLOBULIN, TOTAL: 2.7 g/dL (ref 1.5–4.5)
Glucose: 104 mg/dL — ABNORMAL HIGH (ref 65–99)
POTASSIUM: 4.6 mmol/L (ref 3.5–5.2)
SODIUM: 141 mmol/L (ref 134–144)
Total Protein: 7 g/dL (ref 6.0–8.5)

## 2016-05-21 DIAGNOSIS — H353111 Nonexudative age-related macular degeneration, right eye, early dry stage: Secondary | ICD-10-CM | POA: Diagnosis not present

## 2016-05-21 DIAGNOSIS — H353232 Exudative age-related macular degeneration, bilateral, with inactive choroidal neovascularization: Secondary | ICD-10-CM | POA: Diagnosis not present

## 2016-05-21 DIAGNOSIS — H401131 Primary open-angle glaucoma, bilateral, mild stage: Secondary | ICD-10-CM | POA: Diagnosis not present

## 2016-05-21 DIAGNOSIS — Z961 Presence of intraocular lens: Secondary | ICD-10-CM | POA: Diagnosis not present

## 2016-05-21 LAB — HM DIABETES EYE EXAM

## 2016-05-26 DIAGNOSIS — H34812 Central retinal vein occlusion, left eye, with macular edema: Secondary | ICD-10-CM | POA: Diagnosis not present

## 2016-05-26 DIAGNOSIS — H35352 Cystoid macular degeneration, left eye: Secondary | ICD-10-CM | POA: Diagnosis not present

## 2016-06-23 ENCOUNTER — Encounter: Payer: Self-pay | Admitting: Nurse Practitioner

## 2016-08-10 ENCOUNTER — Encounter: Payer: Self-pay | Admitting: Pediatrics

## 2016-08-10 ENCOUNTER — Ambulatory Visit (INDEPENDENT_AMBULATORY_CARE_PROVIDER_SITE_OTHER): Payer: Medicare Other | Admitting: Pediatrics

## 2016-08-10 VITALS — BP 130/73 | HR 72 | Temp 96.7°F | Ht 67.0 in | Wt 164.0 lb

## 2016-08-10 DIAGNOSIS — N3090 Cystitis, unspecified without hematuria: Secondary | ICD-10-CM

## 2016-08-10 DIAGNOSIS — R300 Dysuria: Secondary | ICD-10-CM

## 2016-08-10 LAB — MICROSCOPIC EXAMINATION: RENAL EPITHEL UA: NONE SEEN /HPF

## 2016-08-10 LAB — URINALYSIS, COMPLETE
BILIRUBIN UA: NEGATIVE
GLUCOSE, UA: NEGATIVE
KETONES UA: NEGATIVE
Nitrite, UA: NEGATIVE
SPEC GRAV UA: 1.015 (ref 1.005–1.030)
UUROB: 0.2 mg/dL (ref 0.2–1.0)
pH, UA: 5.5 (ref 5.0–7.5)

## 2016-08-10 MED ORDER — DOXYCYCLINE HYCLATE 100 MG PO TABS: 100.0000 mg | ORAL_TABLET | Freq: Two times a day (BID) | ORAL | 0 refills | Status: DC

## 2016-08-10 NOTE — Progress Notes (Signed)
  Subjective:   Patient ID: Priscilla Daniels, female    DOB: 07/06/1940, 76 y.o.   MRN: [id number] CC: Urinary Tract Infection (burning)  HPI: Priscilla Daniels is a 76 y.o. female presenting for Urinary Tract Infection (burning)  Symptoms started yesterday with burning with urination, pressure before urination No fevers Eating and drinking fine   Relevant past medical, surgical, family and social history reviewed. Allergies and medications reviewed and updated. History  Smoking Status  . Never Smoker  Smokeless Tobacco  . Never Used   ROS: Per HPI   Objective:    BP 130/73 (BP Location: Left Arm)   Pulse 72   Temp (!) 96.7 F (35.9 C) (Oral)   Ht 5\' 7"  (1.702 m)   Wt 164 lb (74.4 kg)   BMI 25.69 kg/m   Wt Readings from Last 3 Encounters:  08/10/16 164 lb (74.4 kg)  05/19/16 164 lb (74.4 kg)  02/21/16 162 lb (73.5 kg)    Gen: NAD, alert, cooperative with exam, NCAT EYES: EOMI, no conjunctival injection, or no icterus CV: NRRR, normal S1/S2, no murmur, distal pulses 2+ b/l Resp: CTABL, no wheezes, normal WOB Abd: +BS, soft, NTND. no guarding or organomegaly, no CVA tenderness Ext: No edema, warm Neuro: Alert and oriented  Assessment & Plan:  Priscilla Daniels was seen today for urinary tract infection.  Diagnoses and all orders for this visit:  Cystitis Start below, f/u culture -     doxycycline (VIBRA-TABS) 100 MG tablet; Take 1 tablet (100 mg total) by mouth 2 (two) times daily.  Dysuria -     Urine culture -     Urinalysis, Complete   Follow up plan: As scheduled Rex Krasarol Vincent, MD [facility]

## 2016-08-12 LAB — URINE CULTURE

## 2016-08-20 ENCOUNTER — Encounter: Payer: Self-pay | Admitting: Nurse Practitioner

## 2016-08-20 ENCOUNTER — Ambulatory Visit (INDEPENDENT_AMBULATORY_CARE_PROVIDER_SITE_OTHER): Payer: Medicare Other | Admitting: Nurse Practitioner

## 2016-08-20 VITALS — BP 114/68 | HR 67 | Temp 97.1°F | Ht 67.0 in | Wt 161.0 lb

## 2016-08-20 DIAGNOSIS — N183 Chronic kidney disease, stage 3 unspecified: Secondary | ICD-10-CM

## 2016-08-20 DIAGNOSIS — H409 Unspecified glaucoma: Secondary | ICD-10-CM | POA: Diagnosis not present

## 2016-08-20 DIAGNOSIS — E785 Hyperlipidemia, unspecified: Secondary | ICD-10-CM | POA: Diagnosis not present

## 2016-08-20 DIAGNOSIS — Z1212 Encounter for screening for malignant neoplasm of rectum: Secondary | ICD-10-CM

## 2016-08-20 DIAGNOSIS — I10 Essential (primary) hypertension: Secondary | ICD-10-CM | POA: Diagnosis not present

## 2016-08-20 DIAGNOSIS — E8881 Metabolic syndrome: Secondary | ICD-10-CM

## 2016-08-20 DIAGNOSIS — E119 Type 2 diabetes mellitus without complications: Secondary | ICD-10-CM | POA: Diagnosis not present

## 2016-08-20 DIAGNOSIS — Z1211 Encounter for screening for malignant neoplasm of colon: Secondary | ICD-10-CM

## 2016-08-20 LAB — BAYER DCA HB A1C WAIVED: HB A1C (BAYER DCA - WAIVED): 6.1 % (ref <7.0)

## 2016-08-20 MED ORDER — LISINOPRIL-HYDROCHLOROTHIAZIDE 20-12.5 MG PO TABS: 1.0000 | ORAL_TABLET | Freq: Every day | ORAL | 1 refills | Status: DC

## 2016-08-20 NOTE — Patient Instructions (Signed)
Fat and Cholesterol Restricted Diet Getting too much fat and cholesterol in your diet may cause health problems. Following this diet helps keep your fat and cholesterol at normal levels. This can keep you from getting sick. What types of fat should I choose?  Choose monosaturated and polyunsaturated fats. These are found in foods such as olive oil, canola oil, flaxseeds, walnuts, almonds, and seeds.  Eat more omega-3 fats. Good choices include salmon, mackerel, sardines, tuna, flaxseed oil, and ground flaxseeds.  Limit saturated fats. These are in animal products such as meats, butter, and cream. They can also be in plant products such as palm oil, palm kernel oil, and coconut oil.  Avoid foods with partially hydrogenated oils in them. These contain trans fats. Examples of foods that have trans fats are stick margarine, some tub margarines, cookies, crackers, and other baked goods. What general guidelines do I need to follow?  Check food labels. Look for the words "trans fat" and "saturated fat."  When preparing a meal:  Fill half of your plate with vegetables and green salads.  Fill one fourth of your plate with whole grains. Look for the word "whole" as the first word in the ingredient list.  Fill one fourth of your plate with lean protein foods.  Eat more foods that have fiber, like apples, carrots, beans, peas, and barley.  Eat more home-cooked foods. Eat less at restaurants and buffets.  Limit or avoid alcohol.  Limit foods high in starch and sugar.  Limit fried foods.  Cook foods without frying them. Baking, boiling, grilling, and broiling are all great options.  Lose weight if you are overweight. Losing even a small amount of weight can help your overall health. It can also help prevent diseases such as diabetes and heart disease. What foods can I eat? Grains  Whole grains, such as whole wheat or whole grain breads, crackers, cereals, and pasta. Unsweetened oatmeal,  bulgur, barley, quinoa, or brown rice. Corn or whole wheat flour tortillas. Vegetables  Fresh or frozen vegetables (raw, steamed, roasted, or grilled). Green salads. Fruits  All fresh, canned (in natural juice), or frozen fruits. Meat and Other Protein Products  Ground beef (85% or leaner), grass-fed beef, or beef trimmed of fat. Skinless chicken or turkey. Ground chicken or turkey. Pork trimmed of fat. All fish and seafood. Eggs. Dried beans, peas, or lentils. Unsalted nuts or seeds. Unsalted canned or dry beans. Dairy  Low-fat dairy products, such as skim or 1% milk, 2% or reduced-fat cheeses, low-fat ricotta or cottage cheese, or plain low-fat yogurt. Fats and Oils  Tub margarines without trans fats. Light or reduced-fat mayonnaise and salad dressings. Avocado. Olive, canola, sesame, or safflower oils. Natural peanut or almond butter (choose ones without added sugar and oil). The items listed above may not be a complete list of recommended foods or beverages. Contact your dietitian for more options.  What foods are not recommended? Grains  White bread. White pasta. White rice. Cornbread. Bagels, pastries, and croissants. Crackers that contain trans fat. Vegetables  White potatoes. Corn. Creamed or fried vegetables. Vegetables in a cheese sauce. Fruits  Dried fruits. Canned fruit in light or heavy syrup. Fruit juice. Meat and Other Protein Products  Fatty cuts of meat. Ribs, chicken wings, bacon, sausage, bologna, salami, chitterlings, fatback, hot dogs, bratwurst, and packaged luncheon meats. Liver and organ meats. Dairy  Whole or 2% milk, cream, half-and-half, and cream cheese. Whole milk cheeses. Whole-fat or sweetened yogurt. Full-fat cheeses. Nondairy creamers and whipped   toppings. Processed cheese, cheese spreads, or cheese curds. Sweets and Desserts  Corn syrup, sugars, honey, and molasses. Candy. Jam and jelly. Syrup. Sweetened cereals. Cookies, pies, cakes, donuts, muffins, and ice  cream. Fats and Oils  Butter, stick margarine, lard, shortening, ghee, or bacon fat. Coconut, palm kernel, or palm oils. Beverages  Alcohol. Sweetened drinks (such as sodas, lemonade, and fruit drinks or punches). The items listed above may not be a complete list of foods and beverages to avoid. Contact your dietitian for more information.  This information is not intended to replace advice given to you by your health care provider. Make sure you discuss any questions you have with your health care provider. Document Released: 12/08/2011 Document Revised: 02/13/2016 Document Reviewed: 09/07/2013 Elsevier Interactive Patient Education  2017 Elsevier Inc.  

## 2016-08-20 NOTE — Progress Notes (Signed)
Subjective:    Patient ID: Priscilla Daniels, female    DOB: Nov 24, 1940, 76 y.o.   MRN: [id number]  Patient here today for follow up of chronic medical problems. No changes since last visit. No complaints today.  Outpatient Encounter Prescriptions as of 08/20/2016  Medication Sig  . aspirin (ASPIRIN LOW DOSE) 81 MG EC tablet Take 81 mg by mouth daily.    . cholecalciferol (VITAMIN D) 1000 UNITS tablet Take 1,000 Units by mouth daily.    . dorzolamide-timolol (COSOPT) 22.3-6.8 MG/ML ophthalmic solution   . lisinopril-hydrochlorothiazide (PRINZIDE,ZESTORETIC) 20-12.5 MG tablet Take 1 tablet by mouth daily.  . TRAVATAN Z 0.004 % SOLN ophthalmic solution    Hyperlipidemia  This is a chronic problem. The current episode started more than 1 year ago. The problem is controlled. Recent lipid tests were reviewed and are normal. She has no history of diabetes, hypothyroidism or obesity. Pertinent negatives include no chest pain or shortness of breath. Current antihyperlipidemic treatment includes statins. The current treatment provides moderate improvement of lipids. Compliance problems include adherence to diet and adherence to exercise.  Risk factors for coronary artery disease include dyslipidemia, hypertension and post-menopausal.  Hypertension  This is a chronic problem. The current episode started more than 1 year ago. The problem is unchanged. The problem is controlled. Pertinent negatives include no chest pain, palpitations or shortness of breath. Risk factors for coronary artery disease include dyslipidemia, post-menopausal state and sedentary lifestyle. Past treatments include ACE inhibitors and diuretics. The current treatment provides moderate improvement. Compliance problems include diet and exercise.   Glaucoma/Macular degeneration Sees Dr. Georgia Lopes and Dr. Zadie Rhine CKD stage III Currently watching labs Metabolic syndrome Patient is currently not on any meds- she is suppose to be watching diet.  Does not check blood sugars at home.  Review of Systems  Constitutional: Negative.   HENT: Negative.   Respiratory: Negative.  Negative for cough and shortness of breath.   Cardiovascular: Negative.  Negative for chest pain and palpitations.  Gastrointestinal: Negative.   Endocrine: Negative.   Genitourinary: Positive for dysuria. Negative for frequency, pelvic pain and urgency.       Pt states she "feels pressure" and doesn't feel like she urinating enough  Musculoskeletal: Negative.   Skin: Negative.   Allergic/Immunologic: Positive for environmental allergies.  Hematological: Negative.   Psychiatric/Behavioral: Negative.        Objective:   Physical Exam  Constitutional: She is oriented to person, place, and time. She appears well-developed and well-nourished.  HENT:  Nose: Nose normal.  Mouth/Throat: Oropharynx is clear and moist.  Eyes: EOM are normal.  Neck: Trachea normal, normal range of motion and full passive range of motion without pain. Neck supple. No JVD present. Carotid bruit is not present. No thyromegaly present.  Cardiovascular: Normal rate, regular rhythm, normal heart sounds and intact distal pulses.  Exam reveals no gallop and no friction rub.   No murmur heard. Varicose veins bil lower ext.  Pulmonary/Chest: Effort normal and breath sounds normal.  Abdominal: Soft. Bowel sounds are normal. She exhibits no distension and no mass. There is no tenderness.  Musculoskeletal: Normal range of motion.  Lymphadenopathy:    She has no cervical adenopathy.  Neurological: She is alert and oriented to person, place, and time. She has normal reflexes.  Skin: Skin is warm and dry.  Psychiatric: She has a normal mood and affect. Her behavior is normal. Judgment and thought content normal.   BP 114/68   Pulse 67  Temp 97.1 F (36.2 C) (Oral)   Ht 5' 7"  (1.702 m)   Wt 161 lb (73 kg)   BMI 25.22 kg/m   hgba1c- 6.1% down from 6.5    Assessment & Plan:  1.  Hyperlipidemia with target LDL less than 100 Low fat diet - Lipid panel  2. Essential hypertension Low sodium - CMP14+EGFR - lisinopril-hydrochlorothiazide (PRINZIDE,ZESTORETIC) 20-12.5 MG tablet; Take 1 tablet by mouth daily.  Dispense: 90 tablet; Refill: 1  3. CKD (chronic kidney disease), stage III Will continue to watch labs  4. Metabolic syndrome Low carb diet - Bayer DCA Hb A1c Waived  5. Glaucoma of both eyes, unspecified glaucoma type Keep follow up with ete doctor  6. Screening for colorectal cancer - Fecal occult blood, imunochemical; Future    Labs pending Health maintenance reviewed Diet and exercise encouraged Continue all meds Follow up  In 6 months   [place], FNP

## 2016-08-21 LAB — LIPID PANEL
CHOLESTEROL TOTAL: 203 mg/dL — AB (ref 100–199)
Chol/HDL Ratio: 2.1 ratio units (ref 0.0–4.4)
HDL: 97 mg/dL (ref >39)
LDL CALC: 89 mg/dL (ref 0–99)
TRIGLYCERIDES: 83 mg/dL (ref 0–149)
VLDL CHOLESTEROL CAL: 17 mg/dL (ref 5–40)

## 2016-08-21 LAB — CMP14+EGFR
ALBUMIN: 4 g/dL (ref 3.5–4.8)
ALK PHOS: 57 IU/L (ref 39–117)
ALT: 13 IU/L (ref 0–32)
AST: 17 IU/L (ref 0–40)
Albumin/Globulin Ratio: 1.6 (ref 1.2–2.2)
BUN/Creatinine Ratio: 31 — ABNORMAL HIGH (ref 12–28)
BUN: 37 mg/dL — AB (ref 8–27)
Bilirubin Total: 0.4 mg/dL (ref 0.0–1.2)
CALCIUM: 9.5 mg/dL (ref 8.7–10.3)
CO2: 24 mmol/L (ref 18–29)
CREATININE: 1.21 mg/dL — AB (ref 0.57–1.00)
Chloride: 100 mmol/L (ref 96–106)
GFR calc Af Amer: 51 mL/min/{1.73_m2} — ABNORMAL LOW (ref >59)
GFR calc non Af Amer: 44 mL/min/{1.73_m2} — ABNORMAL LOW (ref >59)
GLUCOSE: 109 mg/dL — AB (ref 65–99)
Globulin, Total: 2.5 g/dL (ref 1.5–4.5)
Potassium: 4.6 mmol/L (ref 3.5–5.2)
Sodium: 141 mmol/L (ref 134–144)
Total Protein: 6.5 g/dL (ref 6.0–8.5)

## 2016-09-08 DIAGNOSIS — H353132 Nonexudative age-related macular degeneration, bilateral, intermediate dry stage: Secondary | ICD-10-CM | POA: Diagnosis not present

## 2016-09-08 DIAGNOSIS — H43813 Vitreous degeneration, bilateral: Secondary | ICD-10-CM | POA: Diagnosis not present

## 2016-09-08 DIAGNOSIS — H34812 Central retinal vein occlusion, left eye, with macular edema: Secondary | ICD-10-CM | POA: Diagnosis not present

## 2016-09-08 DIAGNOSIS — H401132 Primary open-angle glaucoma, bilateral, moderate stage: Secondary | ICD-10-CM | POA: Diagnosis not present

## 2016-09-08 DIAGNOSIS — H35352 Cystoid macular degeneration, left eye: Secondary | ICD-10-CM | POA: Diagnosis not present

## 2016-10-15 DIAGNOSIS — H401131 Primary open-angle glaucoma, bilateral, mild stage: Secondary | ICD-10-CM | POA: Diagnosis not present

## 2016-10-15 DIAGNOSIS — H3412 Central retinal artery occlusion, left eye: Secondary | ICD-10-CM | POA: Diagnosis not present

## 2016-10-15 DIAGNOSIS — H353222 Exudative age-related macular degeneration, left eye, with inactive choroidal neovascularization: Secondary | ICD-10-CM | POA: Diagnosis not present

## 2016-10-15 DIAGNOSIS — H353111 Nonexudative age-related macular degeneration, right eye, early dry stage: Secondary | ICD-10-CM | POA: Diagnosis not present

## 2017-01-05 DIAGNOSIS — H35352 Cystoid macular degeneration, left eye: Secondary | ICD-10-CM | POA: Diagnosis not present

## 2017-01-05 DIAGNOSIS — H353132 Nonexudative age-related macular degeneration, bilateral, intermediate dry stage: Secondary | ICD-10-CM | POA: Diagnosis not present

## 2017-01-05 DIAGNOSIS — H35042 Retinal micro-aneurysms, unspecified, left eye: Secondary | ICD-10-CM | POA: Diagnosis not present

## 2017-01-05 DIAGNOSIS — H348122 Central retinal vein occlusion, left eye, stable: Secondary | ICD-10-CM | POA: Diagnosis not present

## 2017-01-20 DIAGNOSIS — H3412 Central retinal artery occlusion, left eye: Secondary | ICD-10-CM | POA: Diagnosis not present

## 2017-01-20 DIAGNOSIS — H401131 Primary open-angle glaucoma, bilateral, mild stage: Secondary | ICD-10-CM | POA: Diagnosis not present

## 2017-02-11 ENCOUNTER — Ambulatory Visit: Payer: Medicare Other | Admitting: Nurse Practitioner

## 2017-02-15 ENCOUNTER — Encounter: Payer: Self-pay | Admitting: Nurse Practitioner

## 2017-02-15 ENCOUNTER — Ambulatory Visit (INDEPENDENT_AMBULATORY_CARE_PROVIDER_SITE_OTHER): Payer: Medicare Other | Admitting: Nurse Practitioner

## 2017-02-15 VITALS — BP 97/62 | HR 75 | Temp 96.8°F | Ht 67.0 in | Wt 161.0 lb

## 2017-02-15 DIAGNOSIS — E8881 Metabolic syndrome: Secondary | ICD-10-CM | POA: Diagnosis not present

## 2017-02-15 DIAGNOSIS — N3000 Acute cystitis without hematuria: Secondary | ICD-10-CM

## 2017-02-15 DIAGNOSIS — Z87898 Personal history of other specified conditions: Secondary | ICD-10-CM

## 2017-02-15 DIAGNOSIS — N183 Chronic kidney disease, stage 3 unspecified: Secondary | ICD-10-CM

## 2017-02-15 DIAGNOSIS — E785 Hyperlipidemia, unspecified: Secondary | ICD-10-CM

## 2017-02-15 DIAGNOSIS — H409 Unspecified glaucoma: Secondary | ICD-10-CM

## 2017-02-15 DIAGNOSIS — I700 Atherosclerosis of aorta: Secondary | ICD-10-CM | POA: Diagnosis not present

## 2017-02-15 DIAGNOSIS — I10 Essential (primary) hypertension: Secondary | ICD-10-CM | POA: Diagnosis not present

## 2017-02-15 LAB — URINALYSIS, COMPLETE
Bilirubin, UA: NEGATIVE
Glucose, UA: NEGATIVE
NITRITE UA: POSITIVE — AB
Specific Gravity, UA: 1.025 (ref 1.005–1.030)
Urobilinogen, Ur: 0.2 mg/dL (ref 0.2–1.0)
pH, UA: 5.5 (ref 5.0–7.5)

## 2017-02-15 LAB — BAYER DCA HB A1C WAIVED: HB A1C (BAYER DCA - WAIVED): 6.3 % (ref <7.0)

## 2017-02-15 LAB — MICROSCOPIC EXAMINATION: RENAL EPITHEL UA: NONE SEEN /HPF

## 2017-02-15 MED ORDER — CIPROFLOXACIN HCL 500 MG PO TABS: 500.0000 mg | ORAL_TABLET | Freq: Two times a day (BID) | ORAL | 0 refills | Status: DC

## 2017-02-15 MED ORDER — LISINOPRIL-HYDROCHLOROTHIAZIDE 20-12.5 MG PO TABS: 1.0000 | ORAL_TABLET | Freq: Every day | ORAL | 1 refills | Status: DC

## 2017-02-15 NOTE — Progress Notes (Signed)
Subjective:    Patient ID: Priscilla Daniels, female    DOB: 20-Oct-1940, 76 y.o.   MRN: [id number]  HPI  Priscilla Daniels is here today for follow up of chronic medical problem.  Outpatient Encounter Prescriptions as of 02/15/2017  Medication Sig  . aspirin (ASPIRIN LOW DOSE) 81 MG EC tablet Take 81 mg by mouth daily.    . cholecalciferol (VITAMIN D) 1000 UNITS tablet Take 1,000 Units by mouth daily.    . dorzolamide-timolol (COSOPT) 22.3-6.8 MG/ML ophthalmic solution   . lisinopril-hydrochlorothiazide (PRINZIDE,ZESTORETIC) 20-12.5 MG tablet Take 1 tablet by mouth daily.  Dorette Grate Z 0.004 % SOLN ophthalmic solution    No facility-administered encounter medications on file as of 02/15/2017.     1. Hyperlipidemia with target LDL less than 100  Does not really watch diet very closely  2. Essential hypertension  No c/o chest pain,SOB or headache. Does ot check bloop pressures at home  3. Metabolic syndrome  blood sugars have been slightly elevated in past but HGA1c was 6.1% when last checked  4. Atherosclerosis of aorta ([place])  Has not seen cardiology recently  5. CKD (chronic kidney disease), stage III  Just currently watching labs  6. Glaucoma of both eyes, unspecified glaucoma type  Sees eye doctor every 6 months    New complaints: None today  Social history: Lives alone    Review of Systems  Constitutional: Negative for activity change and appetite change.  HENT: Negative.   Eyes: Negative for pain.  Respiratory: Negative for shortness of breath.   Cardiovascular: Negative for chest pain, palpitations and leg swelling.  Gastrointestinal: Negative for abdominal pain.  Endocrine: Negative for polydipsia.  Genitourinary: Negative.   Skin: Negative for rash.  Neurological: Negative for dizziness, weakness and headaches.  Hematological: Does not bruise/bleed easily.  Psychiatric/Behavioral: Negative.   All other systems reviewed and are negative.      Objective:   Physical Exam  Constitutional: She is oriented to person, place, and time. She appears well-developed and well-nourished.  HENT:  Nose: Nose normal.  Mouth/Throat: Oropharynx is clear and moist.  Eyes: EOM are normal.  Neck: Trachea normal, normal range of motion and full passive range of motion without pain. Neck supple. No JVD present. Carotid bruit is not present. No thyromegaly present.  Cardiovascular: Normal rate, regular rhythm, normal heart sounds and intact distal pulses.  Exam reveals no gallop and no friction rub.   No murmur heard. Pulmonary/Chest: Effort normal and breath sounds normal.  Abdominal: Soft. Bowel sounds are normal. She exhibits no distension and no mass. There is no tenderness.  Musculoskeletal: Normal range of motion.  Lymphadenopathy:    She has no cervical adenopathy.  Neurological: She is alert and oriented to person, place, and time. She has normal reflexes.  Skin: Skin is warm and dry.  Psychiatric: She has a normal mood and affect. Her behavior is normal. Judgment and thought content normal.   BP 97/62   Pulse 75   Temp (!) 96.8 F (36 C) (Oral)   Ht 5' 7"  (1.702 m)   Wt 161 lb (73 kg)   BMI 25.22 kg/m   Urine- positive nitrites       Assessment & Plan:  1. Hyperlipidemia with target LDL less than 100 Low fat diet encouraged - Lipid panel  2. Essential hypertension Watch salt in diet - CMP14+EGFR - lisinopril-hydrochlorothiazide (PRINZIDE,ZESTORETIC) 20-12.5 MG tablet; Take 1 tablet by mouth daily.  Dispense: 90 tablet; Refill: 1  3. Metabolic syndrome Encouraged to not over eat carbs - Bayer DCA Hb A1c Waived  4. Atherosclerosis of aorta ([place])  5. CKD (chronic kidney disease), stage III Labs pending  6. Glaucoma of both eyes, unspecified glaucoma type Keep follow up with eye doctor  7. History of urinary frequency - Urinalysis, Complete  8. Acute cystitis without hematuria Take medication as prescribe Cotton underwear Take  shower not bath Cranberry juice, yogurt Force fluids AZO over the counter X2 days Culture pending RTO prn - ciprofloxacin (CIPRO) 500 MG tablet; Take 1 tablet (500 mg total) by mouth 2 (two) times daily.  Dispense: 6 tablet; Refill: 0 - Urine Culture    Labs pending Health maintenance reviewed Diet and exercise encouraged Continue all meds Follow up  In 6 months   [place], FNP

## 2017-02-15 NOTE — Patient Instructions (Signed)
Asymptomatic Bacteriuria Asymptomatic bacteriuria is the presence of a large number of bacteria in the urine without the usual symptoms of burning or frequent urination. What are the causes? This condition is caused by an increase in bacteria in the urine. This increase can be caused by:  Bacteria entering the urinary tract, such as during sex.  A blockage in the urinary tract, such as from kidney stones or a tumor.  Bladder problems that prevent the bladder from emptying.  What increases the risk? You are more likely to develop this condition if:  You have diabetes mellitus.  You are an elderly adult, especially if you are also in a long-term care facility.  You are pregnant and in the first trimester.  You have kidney stones.  You are female.  You have had a kidney transplant.  You have a leaky kidney tube valve (reflux).  You had a urinary catheter for a long period of time.  What are the signs or symptoms? There are no symptoms of this condition. How is this diagnosed? This condition is diagnosed with a urine test. Because this condition does not cause symptoms, it is usually diagnosed when a urine sample is taken to treat or diagnose another condition, such as pregnancy or kidney problems. Most women who are in their first trimester of pregnancy are screened for asymptomatic bacteriuria. How is this treated? Usually, treatment is not needed for this condition. Treating the condition can lead to other problems, such as a yeast infection or the growth of bacteria that do not respond to treatment (antibiotic-resistant bacteria). Some people, such as pregnant women and people with kidney transplants, do need treatment with antibiotic medicines to prevent kidney infection (pyelonephritis). In pregnant women, kidney infection can lead to premature labor, fetal growth restriction, or newborn death. Follow these instructions at home: Medicines  Take over-the-counter and  prescription medicines only as told by your health care provider.  If you were prescribed an antibiotic medicine, take it as told by your health care provider. Do not stop taking the antibiotic even if you start to feel better. General instructions  Monitor your condition for any changes.  Drink enough fluid to keep your urine clear or pale yellow.  Go to the bathroom more often to keep your bladder empty.  If you are female, keep the area around your vagina and rectum clean. Wipe yourself from front to back after urinating.  Keep all follow-up visits as told by your health care provider. This is important. Contact a health care provider if:  You notice any new symptoms, such as back pain or burning while urinating. Get help right away if:  You develop signs of an infection such as: ? A burning sensation when you urinate. ? Have pain when you urinate. ? Develop an intense need to urinate. ? Urinating more frequently. ? Back pain or pelvic pain. ? Fever or chills.  You have blood in your urine.  Your urine becomes discolored or cloudy.  Your urine smells bad.  You have severe pain that cannot be controlled with medicine. Summary  Asymptomatic bacteriuria is the presence of a large number of bacteria in the urine without the usual symptoms of burning or frequent urination.  Usually, treatment is not needed for this condition. Treating the condition can lead to other problems, such as too much yeast and the growth of antibiotic-resistant bacteria.  Some people, such as pregnant women and people with kidney transplants, do need treatment with antibiotic medicines to prevent   kidney infection (pyelonephritis).  If you were prescribed an antibiotic medicine, take it as told by your health care provider. Do not stop taking the antibiotic even if you start to feel better. This information is not intended to replace advice given to you by your health care provider. Make sure you  discuss any questions you have with your health care provider. Document Released: 06/08/2005 Document Revised: 06/02/2016 Document Reviewed: 06/02/2016 Elsevier Interactive Patient Education  2017 [employer] Inc.  

## 2017-02-16 LAB — LIPID PANEL
CHOL/HDL RATIO: 2.2 ratio (ref 0.0–4.4)
Cholesterol, Total: 204 mg/dL — ABNORMAL HIGH (ref 100–199)
HDL: 94 mg/dL (ref >39)
LDL Calculated: 95 mg/dL (ref 0–99)
TRIGLYCERIDES: 76 mg/dL (ref 0–149)
VLDL Cholesterol Cal: 15 mg/dL (ref 5–40)

## 2017-02-16 LAB — CMP14+EGFR
A/G RATIO: 1.5 (ref 1.2–2.2)
ALT: 10 IU/L (ref 0–32)
AST: 17 IU/L (ref 0–40)
Albumin: 4.3 g/dL (ref 3.5–4.8)
Alkaline Phosphatase: 59 IU/L (ref 39–117)
BUN/Creatinine Ratio: 18 (ref 12–28)
BUN: 22 mg/dL (ref 8–27)
Bilirubin Total: 0.3 mg/dL (ref 0.0–1.2)
CALCIUM: 9.5 mg/dL (ref 8.7–10.3)
CO2: 25 mmol/L (ref 20–29)
Chloride: 103 mmol/L (ref 96–106)
Creatinine, Ser: 1.19 mg/dL — ABNORMAL HIGH (ref 0.57–1.00)
GFR calc Af Amer: 51 mL/min/{1.73_m2} — ABNORMAL LOW (ref >59)
GFR, EST NON AFRICAN AMERICAN: 44 mL/min/{1.73_m2} — AB (ref >59)
GLOBULIN, TOTAL: 2.8 g/dL (ref 1.5–4.5)
Glucose: 106 mg/dL — ABNORMAL HIGH (ref 65–99)
POTASSIUM: 4.6 mmol/L (ref 3.5–5.2)
SODIUM: 142 mmol/L (ref 134–144)
Total Protein: 7.1 g/dL (ref 6.0–8.5)

## 2017-02-18 LAB — URINE CULTURE

## 2017-03-02 DIAGNOSIS — H348122 Central retinal vein occlusion, left eye, stable: Secondary | ICD-10-CM | POA: Diagnosis not present

## 2017-03-02 DIAGNOSIS — H35352 Cystoid macular degeneration, left eye: Secondary | ICD-10-CM | POA: Diagnosis not present

## 2017-03-02 DIAGNOSIS — H401132 Primary open-angle glaucoma, bilateral, moderate stage: Secondary | ICD-10-CM | POA: Diagnosis not present

## 2017-03-02 DIAGNOSIS — H353132 Nonexudative age-related macular degeneration, bilateral, intermediate dry stage: Secondary | ICD-10-CM | POA: Diagnosis not present

## 2017-03-22 ENCOUNTER — Ambulatory Visit (INDEPENDENT_AMBULATORY_CARE_PROVIDER_SITE_OTHER): Payer: Medicare Other | Admitting: Family

## 2017-03-22 ENCOUNTER — Encounter: Payer: Self-pay | Admitting: Family

## 2017-03-22 VITALS — BP 147/77 | HR 68 | Temp 97.8°F | Ht 67.0 in | Wt 165.8 lb

## 2017-03-22 DIAGNOSIS — Z23 Encounter for immunization: Secondary | ICD-10-CM

## 2017-03-22 DIAGNOSIS — N6310 Unspecified lump in the right breast, unspecified quadrant: Secondary | ICD-10-CM | POA: Diagnosis not present

## 2017-03-22 NOTE — Progress Notes (Signed)
   Subjective:    Patient ID: Priscilla Daniels, female    DOB: 25-Aug-1940, 76 y.o.   MRN: [id number]  HPI PT presents to the office today with a "lump" in right breast that she noticed three weeks ago. However, pt states she can not find the nodule now.  Pt had a mammogram scheduled last Monday and told the patient she would need a diagnostic mammogram and could not get her mammogram.    Review of Systems  All other systems reviewed and are negative.      Objective:   Physical Exam  Constitutional: She is oriented to person, place, and time. She appears well-developed and well-nourished. No distress.  HENT:  Head: Normocephalic.  Eyes: Pupils are equal, round, and reactive to light.  Neck: Normal range of motion. Neck supple. No thyromegaly present.  Cardiovascular: Normal rate, regular rhythm, normal heart sounds and intact distal pulses.   No murmur heard. Pulmonary/Chest: Effort normal and breath sounds normal. No respiratory distress. She has no wheezes. Right breast exhibits no inverted nipple, no mass (no mass felt), no nipple discharge, no skin change and no tenderness. Left breast exhibits no inverted nipple, no mass, no nipple discharge, no skin change and no tenderness. Breasts are symmetrical.  Abdominal: Soft. Bowel sounds are normal. She exhibits no distension. There is no tenderness.  Musculoskeletal: Normal range of motion. She exhibits no edema or tenderness.  Neurological: She is alert and oriented to person, place, and time.  Skin: Skin is warm and dry.  Psychiatric: She has a normal mood and affect. Her behavior is normal. Judgment and thought content normal.  Vitals reviewed.     BP (!) 147/77   Pulse 68   Temp 97.8 F (36.6 C) (Oral)   Ht  (1.702 m)   Wt 165 lb 12.8 oz (75.2 kg)   BMI 25.97 kg/m      Assessment & Plan:  1. Breast mass, right Will order Diagnostic mammogram to rule out malignancy Encouraged self breast exam  RTO prn   - MM DIAG  BREAST TOMO BILATERAL; Future - US BREAST LTD UNI RIGHT INC AXILLA; Future   Jannifer Rodney, FNP

## 2017-03-24 ENCOUNTER — Telehealth: Payer: Self-pay | Admitting: Nurse Practitioner

## 2017-03-24 NOTE — Telephone Encounter (Signed)
Pt scheduled at the [facility] in [place] on 03/31/17 at 9:40am Pt aware of appointment date/time

## 2017-03-30 ENCOUNTER — Other Ambulatory Visit: Payer: Self-pay | Admitting: Family

## 2017-03-30 ENCOUNTER — Encounter (HOSPITAL_COMMUNITY): Payer: Self-pay

## 2017-03-31 DIAGNOSIS — N6313 Unspecified lump in the right breast, lower outer quadrant: Secondary | ICD-10-CM | POA: Diagnosis not present

## 2017-03-31 DIAGNOSIS — N6489 Other specified disorders of breast: Secondary | ICD-10-CM | POA: Diagnosis not present

## 2017-03-31 DIAGNOSIS — R928 Other abnormal and inconclusive findings on diagnostic imaging of breast: Secondary | ICD-10-CM | POA: Diagnosis not present

## 2017-04-21 DIAGNOSIS — H3412 Central retinal artery occlusion, left eye: Secondary | ICD-10-CM | POA: Diagnosis not present

## 2017-04-21 DIAGNOSIS — H401131 Primary open-angle glaucoma, bilateral, mild stage: Secondary | ICD-10-CM | POA: Diagnosis not present

## 2017-04-28 ENCOUNTER — Ambulatory Visit (INDEPENDENT_AMBULATORY_CARE_PROVIDER_SITE_OTHER): Payer: Medicare Other | Admitting: Family Medicine

## 2017-04-28 ENCOUNTER — Encounter: Payer: Self-pay | Admitting: Family Medicine

## 2017-04-28 VITALS — BP 120/66 | HR 68 | Temp 97.0°F | Ht 67.0 in | Wt 162.6 lb

## 2017-04-28 DIAGNOSIS — R300 Dysuria: Secondary | ICD-10-CM | POA: Diagnosis not present

## 2017-04-28 DIAGNOSIS — N3000 Acute cystitis without hematuria: Secondary | ICD-10-CM

## 2017-04-28 LAB — URINALYSIS, COMPLETE
Bilirubin, UA: NEGATIVE
Glucose, UA: NEGATIVE
NITRITE UA: NEGATIVE
Protein, UA: NEGATIVE
RBC, UA: NEGATIVE
Specific Gravity, UA: 1.02 (ref 1.005–1.030)
Urobilinogen, Ur: 1 mg/dL (ref 0.2–1.0)
pH, UA: 6 (ref 5.0–7.5)

## 2017-04-28 LAB — MICROSCOPIC EXAMINATION: Renal Epithel, UA: NONE SEEN /hpf

## 2017-04-28 MED ORDER — CEPHALEXIN 500 MG PO CAPS: 500.0000 mg | ORAL_CAPSULE | Freq: Two times a day (BID) | ORAL | 0 refills | Status: DC

## 2017-04-28 NOTE — Patient Instructions (Signed)
I value your feedback and appreciate you entrusting us with your care.  If you get a survey, I would appreciate your taking the time to let us know what your experience was like.  I have sent in cephalexin for you to take by mouth twice a day for the next 5 days.  Make sure that you are drinking plenty of water.  Avoid sugary beverages and foods.  If you develop fevers, chills, nausea, vomiting, worsening symptoms, blood in your urine or back pain, please seek immediate medical attention.  I have ordered a urine culture and will contact you with the results once these are available.   Urinary Tract Infection, Adult A urinary tract infection (UTI) is an infection of any part of the urinary tract. The urinary tract includes the:  Kidneys.  Ureters.  Bladder.  Urethra.  These organs make, store, and get rid of pee (urine) in the body. Follow these instructions at home:  Take over-the-counter and prescription medicines only as told by your doctor.  If you were prescribed an antibiotic medicine, take it as told by your doctor. Do not stop taking the antibiotic even if you start to feel better.  Avoid the following drinks: ? Alcohol. ? Caffeine. ? Tea. ? Carbonated drinks.  Drink enough fluid to keep your pee clear or pale yellow.  Keep all follow-up visits as told by your doctor. This is important.  Make sure to: ? Empty your bladder often and completely. Do not to hold pee for long periods of time. ? Empty your bladder before and after sex. ? Wipe from front to back after a bowel movement if you are female. Use each tissue one time when you wipe. Contact a doctor if:  You have back pain.  You have a fever.  You feel sick to your stomach (nauseous).  You throw up (vomit).  Your symptoms do not get better after 3 days.  Your symptoms go away and then come back. Get help right away if:  You have very bad back pain.  You have very bad lower belly (abdominal) pain.  You  are throwing up and cannot keep down any medicines or water. This information is not intended to replace advice given to you by your health care provider. Make sure you discuss any questions you have with your health care provider. Document Released: 11/25/2007 Document Revised: 11/14/2015 Document Reviewed: 04/29/2015 Elsevier Interactive Patient Education  [employer]2018 Elsevier Inc.

## 2017-04-28 NOTE — Progress Notes (Signed)
Subjective: CC: urinary symptoms PCP: Bennie PieriniMartin, Mary-Margaret, FNP RUE:[id number]:Priscilla Daniels is a 76 y.o. female presenting to clinic today for:  1. Urinary symptoms Patient reports a 1 week h/o urinary urgency, frequency and intermittent dysuria .  Denies hematuria, fevers, chills, nausea, vomiting, back pain.  No abnormal vaginal discharge or bleeding. Patient has used nothing for symptoms.  Patient denies a h/o frequent or recurrent UTIs.     Allergies  Allergen Reactions  . Lipitor [Atorvastatin Calcium]   . Septra [Bactrim]   . Sulfa Antibiotics    Past Medical History:  Diagnosis Date  . Abnormal vaginal Pap smear   . Candidiasis    cuteneous   . Chronic UTI   . COPD (chronic obstructive pulmonary disease) (HCC) 2010   Dx by CXR   . DM (diabetes mellitus) (HCC)   . Fatigue   . Glaucoma   . Hyperlipidemia    Diet Controlled   . NIDDM (non-insulin dependent diabetes mellitus)   . Phlebitis   . Strep pharyngitis   . Vaginal atrophy   . Vertigo    Family History  Problem Relation Age of Onset  . Stroke Mother   . Cancer Father   . Cancer Sister   . Diabetes Brother   . Hyperlipidemia Brother   . Hyperlipidemia Brother     Current Outpatient Medications:  .  cholecalciferol (VITAMIN D) 1000 UNITS tablet, Take 1,000 Units by mouth daily.  , Disp: , Rfl:  .  dorzolamide-timolol (COSOPT) 22.3-6.8 MG/ML ophthalmic solution, , Disp: , Rfl:  .  lisinopril-hydrochlorothiazide (PRINZIDE,ZESTORETIC) 20-12.5 MG tablet, Take 1 tablet by mouth daily., Disp: 90 tablet, Rfl: 1 .  TRAVATAN Z 0.004 % SOLN ophthalmic solution, , Disp: , Rfl:  .  cephALEXin (KEFLEX) 500 MG capsule, Take 1 capsule (500 mg total) 2 (two) times daily by mouth., Disp: 10 capsule, Rfl: 0  Social Hx: non smoker.  Health Maintenance: Flu shot completed   ROS: Per HPI  Objective: Office vital signs reviewed. BP 120/66   Pulse 68   Temp (!) 97 F (36.1 C) (Oral)   Ht 5\' 7"  (1.702 m)   Wt 162 lb  9.6 oz (73.8 kg)   BMI 25.47 kg/m   Physical Examination:  General: Awake, alert, well nourished, well appearing elderly female, No acute distress Cardio: RRR, +2 DP GU: No suprapubic tenderness or palpable abdominal masses, no CVA tenderness.  Assessment/ Plan: 76 y.o. female   1. Acute cystitis without hematuria Patient is well-appearing, afebrile with normal vital signs.  Her urinalysis was remarkable for trace ketones and 1+ leukocytes.  Nitrite negative.  Urine microscopy revealed 11-30 white blood cells, 0-2 red blood cells, greater than 10 epithelial cells and few bacteria.  Per patient this was a clean catch.  We will start empiric antibiotics with Keflex renally adjusted to twice daily given creatinine clearance of 47 mL/min.  She will take this for 5 days.  August 2018 urine culture was reviewed which grew E. coli that was pansensitive.  Urine culture was added today as well.  I did discuss with her that if this showed negative growth we may consider discontinuing the antibiotics.  She voiced good understanding. Strict return precautions and reasons for emergent evaluation in the emergency department review with patient.  They voiced understanding and will follow-up as needed. - Urinalysis, Complete - Urine Culture   Orders Placed This Encounter  Procedures  . Urine Culture  . Urinalysis, Complete   Meds ordered this  encounter  Medications  . cephALEXin (KEFLEX) 500 MG capsule    Sig: Take 1 capsule (500 mg total) 2 (two) times daily by mouth.    Dispense:  10 capsule    Refill:  0    Priscilla Hulen Skains, DO [facility] [phone]

## 2017-05-01 LAB — URINE CULTURE

## 2017-06-29 DIAGNOSIS — H348122 Central retinal vein occlusion, left eye, stable: Secondary | ICD-10-CM | POA: Diagnosis not present

## 2017-06-29 DIAGNOSIS — H353132 Nonexudative age-related macular degeneration, bilateral, intermediate dry stage: Secondary | ICD-10-CM | POA: Diagnosis not present

## 2017-07-28 ENCOUNTER — Ambulatory Visit (INDEPENDENT_AMBULATORY_CARE_PROVIDER_SITE_OTHER): Payer: Medicare Other | Admitting: Family Medicine

## 2017-07-28 ENCOUNTER — Encounter: Payer: Self-pay | Admitting: Family Medicine

## 2017-07-28 VITALS — BP 129/78 | HR 69 | Temp 98.1°F | Ht 67.0 in | Wt 162.0 lb

## 2017-07-28 DIAGNOSIS — L03211 Cellulitis of face: Secondary | ICD-10-CM | POA: Diagnosis not present

## 2017-07-28 DIAGNOSIS — R300 Dysuria: Secondary | ICD-10-CM | POA: Diagnosis not present

## 2017-07-28 DIAGNOSIS — N3000 Acute cystitis without hematuria: Secondary | ICD-10-CM

## 2017-07-28 LAB — MICROSCOPIC EXAMINATION

## 2017-07-28 LAB — URINALYSIS, COMPLETE
BILIRUBIN UA: NEGATIVE
GLUCOSE, UA: NEGATIVE
Nitrite, UA: NEGATIVE
PROTEIN UA: NEGATIVE
RBC UA: NEGATIVE
SPEC GRAV UA: 1.02 (ref 1.005–1.030)
UUROB: 0.2 mg/dL (ref 0.2–1.0)
pH, UA: 5.5 (ref 5.0–7.5)

## 2017-07-28 MED ORDER — CIPROFLOXACIN HCL 500 MG PO TABS: 500.0000 mg | ORAL_TABLET | Freq: Two times a day (BID) | ORAL | 0 refills | Status: DC

## 2017-07-28 NOTE — Progress Notes (Signed)
BP 129/78   Pulse 69   Temp 98.1 F (36.7 C) (Oral)   Ht 5\' 7"  (1.702 m)   Wt 162 lb (73.5 kg)   BMI 25.37 kg/m    Subjective:    Patient ID: Priscilla Daniels, female    DOB: [birth year], 77 y.o.   MRN: [id number]  HPI: Priscilla Daniels is a 77 y.o. female presenting on 07/28/2017 for Rash on face and scalp, pain radiating from scalp into face (started on Sunday) and Urinary Tract Infection (dysuria)   HPI  Rash: Patient presents to the clinic with a rash that appeared Monday morning (x3 days). The lesions are located over her right eye. She states the rash has not worsened or improved since Monday. She took severe allergy for sinus medication twice on Sunday and noticed the rash pop up the following morning. She states that she will occasionally get shooting pain down the right side of her head which goes around the posterior aspect of her ear. She has not tried anything OTC because she worries about the close proximity to her eye. She has placed a wet washcloth over her eye to help relieve some pain. She admits to some swelling and redness around her right eye. She denies vision changes, swelling of the lips/tongue, problems swallowing, SOB, and chest pain.   Urinary Symptoms: Patient presents to the clinic with dysuria and polyuria x 1 week. She states that her symptoms are not really severe, but thought she might as well get it checked while she was in the clinic today for her rash. She has not tried anything OTC for her urinary symptoms. Patient only notices burning while urinating and does not experience symptoms post-urination. She denies fever, chills, hematuria, and back pain.  Relevant past medical, surgical, family and social history reviewed and updated as indicated. Interim medical history since our last visit reviewed. Allergies and medications reviewed and updated.  Review of Systems  Constitutional: Negative for chills, fatigue and fever.  Genitourinary: Positive for  dysuria, frequency and urgency. Negative for flank pain, hematuria, vaginal bleeding, vaginal discharge and vaginal pain.  Musculoskeletal: Negative for back pain.  Skin: Positive for color change (redness over right eye) and rash (over right eye).    Per HPI unless specifically indicated above   Allergies as of 07/28/2017      Reactions   Lipitor [atorvastatin Calcium]    Septra [bactrim]    Sulfa Antibiotics       Medication List        Accurate as of 07/28/17 12:12 PM. Always use your most recent med list.          cholecalciferol 1000 units tablet Commonly known as:  VITAMIN D Take 1,000 Units by mouth daily.   dorzolamide-timolol 22.3-6.8 MG/ML ophthalmic solution Commonly known as:  COSOPT   lisinopril-hydrochlorothiazide 20-12.5 MG tablet Commonly known as:  PRINZIDE,ZESTORETIC Take 1 tablet by mouth daily.   TRAVATAN Z 0.004 % Soln ophthalmic solution Generic drug:  Travoprost (BAK Free)          Objective:    BP 129/78   Pulse 69   Temp 98.1 F (36.7 C) (Oral)   Ht 5\' 7"  (1.702 m)   Wt 162 lb (73.5 kg)   BMI 25.37 kg/m   Wt Readings from Last 3 Encounters:  07/28/17 162 lb (73.5 kg)  04/28/17 162 lb 9.6 oz (73.8 kg)  03/22/17 165 lb 12.8 oz (75.2 kg)    Physical Exam  Constitutional:  She is oriented to person, place, and time. She appears well-developed and well-nourished. No distress.  Neck: No tracheal deviation present. No thyromegaly present.  Cardiovascular: Normal rate, regular rhythm and normal heart sounds. Exam reveals no gallop and no friction rub.  No murmur heard. Pulmonary/Chest: Effort normal and breath sounds normal. She has no wheezes. She has no rales.  Abdominal: Soft. She exhibits no distension. There is no tenderness. There is no guarding and no CVA tenderness.  Lymphadenopathy:    She has no cervical adenopathy.  Neurological: She is alert and oriented to person, place, and time.  Skin: Rash noted. She is not diaphoretic.  There is erythema.     Psychiatric: She has a normal mood and affect. Her behavior is normal. Judgment and thought content normal.        Assessment & Plan:  UA: 11-30 WBC, 0-2 RBC, 0-10 epithelial cells, 0-10 renal epithelial cells, many bacteria. UA was positive for ketones, nitrites, and LEU 1+. Problem List Items Addressed This Visit    None    Visit Diagnoses    Acute cystitis without hematuria    -  Primary   Relevant Medications   ciprofloxacin (CIPRO) 500 MG tablet   Other Relevant Orders   Urinalysis, Complete (Completed)   Cellulitis of face       Relevant Medications   ciprofloxacin (CIPRO) 500 MG tablet    Acute Cystitis: Patient presents to the clinic with urinary problems x1 week that include dysuria and polyuria. Physical exam was insignificant. She was negative for CVA tenderness. Her UA was showed 11-30 WBC, 0-2 RBC, 0-10 epithelial cells, 0-10 renal epithelial cells, and many bacteria. UA was positive for ketones, nitrites, and LEU 1+. Patient will be treated with Ciprofloxacin 500mg  po BID x 10 days. Patient has been instructed to stay hydrated and to call the office if her symptoms do not improve.  Cellulitis of face: Patient presents to the clinic with a rash directly above her right eye x 3 days. On physical exam, she has two 2-3cm plaques one located below her right eyebrow and the other located directly above. The lesions were erythematous, edematous, and warm to touch. Patient will be treated for cellulitis of the face with Ciprofloxacin po BID x 10 days. Patient has been instructed to put lotion over her rash to prevent excess dryness. She has also been instructed to call the clinic if it does not improve after treatment.   Follow up plan: Return if symptoms worsen or fail to improve.  Counseling provided for all of the vaccine components Orders Placed This Encounter  Procedures  . Urinalysis, Complete   Patient was seen and examined with Caprice Renshawaroline Cheek  PA student, agree with assessment and plan above. Arville CareJoshua Tamar Lipscomb, MD [facility] 08/03/2017, 9:40 PM

## 2017-08-13 ENCOUNTER — Ambulatory Visit (INDEPENDENT_AMBULATORY_CARE_PROVIDER_SITE_OTHER): Payer: Medicare Other | Admitting: Nurse Practitioner

## 2017-08-13 ENCOUNTER — Encounter: Payer: Self-pay | Admitting: Nurse Practitioner

## 2017-08-13 VITALS — BP 108/64 | HR 69 | Temp 97.0°F | Ht 67.0 in | Wt 160.0 lb

## 2017-08-13 DIAGNOSIS — N183 Chronic kidney disease, stage 3 unspecified: Secondary | ICD-10-CM

## 2017-08-13 DIAGNOSIS — E785 Hyperlipidemia, unspecified: Secondary | ICD-10-CM

## 2017-08-13 DIAGNOSIS — E8881 Metabolic syndrome: Secondary | ICD-10-CM

## 2017-08-13 DIAGNOSIS — I700 Atherosclerosis of aorta: Secondary | ICD-10-CM | POA: Diagnosis not present

## 2017-08-13 DIAGNOSIS — I10 Essential (primary) hypertension: Secondary | ICD-10-CM | POA: Diagnosis not present

## 2017-08-13 MED ORDER — LISINOPRIL-HYDROCHLOROTHIAZIDE 20-12.5 MG PO TABS: 1.0000 | ORAL_TABLET | Freq: Every day | ORAL | 1 refills | Status: DC

## 2017-08-13 NOTE — Progress Notes (Signed)
Subjective:    Patient ID: Priscilla Daniels, female    DOB: August 04, 1940, 77 y.o.   MRN: [id number]  HPI  Priscilla Daniels is here today for follow up of chronic medical problem.  Outpatient Encounter Medications as of 08/13/2017  Medication Sig  . dorzolamide-timolol (COSOPT) 22.3-6.8 MG/ML ophthalmic solution   . lisinopril-hydrochlorothiazide (PRINZIDE,ZESTORETIC) 20-12.5 MG tablet Take 1 tablet by mouth daily.  . TRAVATAN Z 0.004 % SOLN ophthalmic solution   . cholecalciferol (VITAMIN D) 1000 UNITS tablet Take 1,000 Units by mouth daily.       1. Atherosclerosis of aorta ([place])  Found on xray  2. Essential hypertension  No c/o chest pain, sob or headache. Does not check blood pressure at home. Lab Results  Component Value Date   HGBA1C 6.3 08/19/2015     3. CKD (chronic kidney disease), stage III ([place])  Currently just watching labs  4. Hyperlipidemia with target LDL less than 100  Does not watch diet  5. Metabolic syndrome  Does not check blood sugars    New complaints:  none today  Social history: Lives alone- has retired    Review of Systems  Constitutional: Negative for activity change and appetite change.  HENT: Negative.   Eyes: Negative for pain.  Respiratory: Negative for shortness of breath.   Cardiovascular: Negative for chest pain, palpitations and leg swelling.  Gastrointestinal: Negative for abdominal pain.  Endocrine: Negative for polydipsia.  Genitourinary: Negative.   Skin: Negative for rash.  Neurological: Negative for dizziness, weakness and headaches.  Hematological: Does not bruise/bleed easily.  Psychiatric/Behavioral: Negative.   All other systems reviewed and are negative.      Objective:   Physical Exam  Constitutional: She is oriented to person, place, and time. She appears well-developed and well-nourished. No distress.  HENT:  Nose: Nose normal.  Mouth/Throat: Oropharynx is clear and moist.  Eyes: EOM are normal.  Neck:  Trachea normal, normal range of motion and full passive range of motion without pain. Neck supple. No JVD present. Carotid bruit is not present. No thyromegaly present.  Cardiovascular: Normal rate, regular rhythm, normal heart sounds and intact distal pulses. Exam reveals no gallop and no friction rub.  No murmur heard. Multiple varicosities bil lower ext  Pulmonary/Chest: Effort normal and breath sounds normal.  Abdominal: Soft. Bowel sounds are normal. She exhibits no distension and no mass. There is no tenderness.  Musculoskeletal: Normal range of motion. She exhibits no edema.  Lymphadenopathy:    She has no cervical adenopathy.  Neurological: She is alert and oriented to person, place, and time. She has normal reflexes.  Skin: Skin is warm and dry.  Psychiatric: She has a normal mood and affect. Her behavior is normal. Judgment and thought content normal.   BP 108/64   Pulse 69   Temp (!) 97 F (36.1 C) (Oral)   Ht '5\' 7"'$  (1.702 m)   Wt 160 lb (72.6 kg)   BMI 25.06 kg/m       Assessment & Plan:  1. Atherosclerosis of aorta ([place]) Will do chest x ray next visit  2. Essential hypertension Low sodium diet - lisinopril-hydrochlorothiazide (PRINZIDE,ZESTORETIC) 20-12.5 MG tablet; Take 1 tablet by mouth daily.  Dispense: 90 tablet; Refill: 1 - CMP14+EGFR  3. CKD (chronic kidney disease), stage III ([place]) Labs pending  4. Hyperlipidemia with target LDL less than 100 Low fat diet - Lipid panel  5. Metabolic syndrome Continue to watch carbs in diet    Labs  pending Health maintenance reviewed Diet and exercise encouraged Continue all meds Follow up  In 6 months   [place], FNP

## 2017-08-13 NOTE — Patient Instructions (Signed)

## 2017-08-14 LAB — CMP14+EGFR
A/G RATIO: 1.7 (ref 1.2–2.2)
ALT: 12 IU/L (ref 0–32)
AST: 17 IU/L (ref 0–40)
Albumin: 4.2 g/dL (ref 3.5–4.8)
Alkaline Phosphatase: 54 IU/L (ref 39–117)
BUN / CREAT RATIO: 16 (ref 12–28)
BUN: 18 mg/dL (ref 8–27)
Bilirubin Total: 0.3 mg/dL (ref 0.0–1.2)
CO2: 23 mmol/L (ref 20–29)
Calcium: 9.4 mg/dL (ref 8.7–10.3)
Chloride: 103 mmol/L (ref 96–106)
Creatinine, Ser: 1.1 mg/dL — ABNORMAL HIGH (ref 0.57–1.00)
GFR calc Af Amer: 56 mL/min/{1.73_m2} — ABNORMAL LOW (ref >59)
GFR calc non Af Amer: 49 mL/min/{1.73_m2} — ABNORMAL LOW (ref >59)
GLOBULIN, TOTAL: 2.5 g/dL (ref 1.5–4.5)
Glucose: 103 mg/dL — ABNORMAL HIGH (ref 65–99)
POTASSIUM: 4.6 mmol/L (ref 3.5–5.2)
SODIUM: 140 mmol/L (ref 134–144)
Total Protein: 6.7 g/dL (ref 6.0–8.5)

## 2017-08-14 LAB — LIPID PANEL
CHOL/HDL RATIO: 2.2 ratio (ref 0.0–4.4)
CHOLESTEROL TOTAL: 201 mg/dL — AB (ref 100–199)
HDL: 92 mg/dL (ref >39)
LDL Calculated: 90 mg/dL (ref 0–99)
TRIGLYCERIDES: 94 mg/dL (ref 0–149)
VLDL Cholesterol Cal: 19 mg/dL (ref 5–40)

## 2017-11-03 ENCOUNTER — Ambulatory Visit (INDEPENDENT_AMBULATORY_CARE_PROVIDER_SITE_OTHER): Payer: Medicare Other | Admitting: Family Medicine

## 2017-11-03 ENCOUNTER — Encounter: Payer: Self-pay | Admitting: Family Medicine

## 2017-11-03 VITALS — BP 117/66 | HR 64 | Temp 98.1°F | Ht 67.0 in | Wt 160.0 lb

## 2017-11-03 DIAGNOSIS — N3001 Acute cystitis with hematuria: Secondary | ICD-10-CM | POA: Diagnosis not present

## 2017-11-03 DIAGNOSIS — R300 Dysuria: Secondary | ICD-10-CM

## 2017-11-03 LAB — URINALYSIS, COMPLETE
BILIRUBIN UA: NEGATIVE
GLUCOSE, UA: NEGATIVE
Ketones, UA: NEGATIVE
NITRITE UA: POSITIVE — AB
Specific Gravity, UA: 1.02 (ref 1.005–1.030)
UUROB: 0.2 mg/dL (ref 0.2–1.0)
pH, UA: 6 (ref 5.0–7.5)

## 2017-11-03 LAB — MICROSCOPIC EXAMINATION: Renal Epithel, UA: NONE SEEN /hpf

## 2017-11-03 MED ORDER — CEPHALEXIN 500 MG PO CAPS: 500.0000 mg | ORAL_CAPSULE | Freq: Two times a day (BID) | ORAL | 0 refills | Status: AC

## 2017-11-03 NOTE — Patient Instructions (Signed)

## 2017-11-03 NOTE — Progress Notes (Signed)
Subjective: CC: Urinary symptoms PCP: Bennie Pierini, FNP [id number] Priscilla Daniels is a 77 y.o. female presenting to clinic today for:  1. Urinary symptoms Patient reports a 1 week h/o urinary frequency, dysuria and urinary urgency.  Denies hematuria, fevers, chills, abdominal pain, nausea, vomiting, back pain, vaginal discharge.  Patient has used nothing for symptoms.  Patient has had 3 UTIs in the last year.  Her urine culture from November 2018 grew Klebsiella and E. coli, both sensitive to cephalosporins.  Past medical history is significant for chronic kidney disease with most recent creatinine of 1.1, GFR of 49.   ROS: Per HPI  Allergies  Allergen Reactions  . Lipitor [Atorvastatin Calcium]   . Septra [Bactrim]   . Sulfa Antibiotics    Past Medical History:  Diagnosis Date  . Abnormal vaginal Pap smear   . Candidiasis    cuteneous   . Chronic UTI   . COPD (chronic obstructive pulmonary disease) (HCC) 2010   Dx by CXR   . DM (diabetes mellitus) (HCC)   . Fatigue   . Glaucoma   . Hyperlipidemia    Diet Controlled   . NIDDM (non-insulin dependent diabetes mellitus)   . Phlebitis   . Strep pharyngitis   . Vaginal atrophy   . Vertigo     Current Outpatient Medications:  .  dorzolamide-timolol (COSOPT) 22.3-6.8 MG/ML ophthalmic solution, , Disp: , Rfl:  .  lisinopril-hydrochlorothiazide (PRINZIDE,ZESTORETIC) 20-12.5 MG tablet, Take 1 tablet by mouth daily., Disp: 90 tablet, Rfl: 1 .  TRAVATAN Z 0.004 % SOLN ophthalmic solution, , Disp: , Rfl:  Social History   Socioeconomic History  . Marital status: Divorced    Spouse name: Not on file  . Number of children: Not on file  . Years of education: Not on file  . Highest education level: Not on file  Occupational History  . Not on file  Social Needs  . Financial resource strain: Not on file  . Food insecurity:    Worry: Not on file    Inability: Not on file  . Transportation needs:    Medical: Not on file    Non-medical: Not on file  Tobacco Use  . Smoking status: Never Smoker  . Smokeless tobacco: Never Used  Substance and Sexual Activity  . Alcohol use: No  . Drug use: No  . Sexual activity: Never  Lifestyle  . Physical activity:    Days per week: Not on file    Minutes per session: Not on file  . Stress: Not on file  Relationships  . Social connections:    Talks on phone: Not on file    Gets together: Not on file    Attends religious service: Not on file    Active member of club or organization: Not on file    Attends meetings of clubs or organizations: Not on file    Relationship status: Not on file  . Intimate partner violence:    Fear of current or ex partner: Not on file    Emotionally abused: Not on file    Physically abused: Not on file    Forced sexual activity: Not on file  Other Topics Concern  . Not on file  Social History Narrative  . Not on file   Family History  Problem Relation Age of Onset  . Stroke Mother   . Cancer Father   . Cancer Sister   . Diabetes Brother   . Hyperlipidemia Brother   . Hyperlipidemia Brother  Objective: Office vital signs reviewed. BP 117/66   Pulse 64   Temp 98.1 F (36.7 C) (Oral)   Ht  (1.702 m)   Wt 160 lb (72.6 kg)   BMI 25.06 kg/m   Physical Examination:  General: Awake, alert, well nourished, No acute distress GU: No suprapubic tenderness to palpation.  No CVA tenderness to palpation.  Assessment/ Plan: 77 y.o. female   1. Acute cystitis with hematuria Urinalysis with 1+ blood, trace protein, nitrite positive and 3+ leukocytes.  Urine microscopy with greater than 30 white blood cells, 3-10 red blood cells, many bacteria.  This is been sent for urine culture.  Patient is afebrile demonstrates no signs or symptoms of pyelonephritis.  Will treat with Keflex p.o. twice daily for the next 7 days.  Renal function was reviewed.  Last urine culture also reviewed.  This will be the fourth urinary tract infection  in the last year for this patient.  I discussed with her that referral to urology should be considered.  Should she have another urinary tract infection, she agrees to proceed with this referral.  Home care instructions reviewed with patient.  Handout provided.  Follow-up as needed. - Urine Culture  2. Dysuria - Urinalysis, Complete   Orders Placed This Encounter  Procedures  . Urine Culture  . Urinalysis, Complete   Meds ordered this encounter  Medications  . cephALEXin (KEFLEX) 500 MG capsule    Sig: Take 1 capsule (500 mg total) by mouth 2 (two) times daily for 7 days.    Dispense:  14 capsule    Refill:  0     Priscilla Calles Hulen Skains, DO [facility] [phone]

## 2017-11-06 LAB — URINE CULTURE

## 2017-12-28 DIAGNOSIS — H348122 Central retinal vein occlusion, left eye, stable: Secondary | ICD-10-CM | POA: Diagnosis not present

## 2017-12-28 DIAGNOSIS — H353132 Nonexudative age-related macular degeneration, bilateral, intermediate dry stage: Secondary | ICD-10-CM | POA: Diagnosis not present

## 2017-12-28 DIAGNOSIS — H401132 Primary open-angle glaucoma, bilateral, moderate stage: Secondary | ICD-10-CM | POA: Diagnosis not present

## 2017-12-28 DIAGNOSIS — H47022 Hemorrhage in optic nerve sheath, left eye: Secondary | ICD-10-CM | POA: Diagnosis not present

## 2018-01-12 DIAGNOSIS — H3412 Central retinal artery occlusion, left eye: Secondary | ICD-10-CM | POA: Diagnosis not present

## 2018-01-12 DIAGNOSIS — H401131 Primary open-angle glaucoma, bilateral, mild stage: Secondary | ICD-10-CM | POA: Diagnosis not present

## 2018-02-10 ENCOUNTER — Encounter: Payer: Self-pay | Admitting: Nurse Practitioner

## 2018-02-10 ENCOUNTER — Ambulatory Visit (INDEPENDENT_AMBULATORY_CARE_PROVIDER_SITE_OTHER): Payer: Medicare Other

## 2018-02-10 ENCOUNTER — Ambulatory Visit (INDEPENDENT_AMBULATORY_CARE_PROVIDER_SITE_OTHER): Payer: Medicare Other | Admitting: Nurse Practitioner

## 2018-02-10 VITALS — BP 130/77 | HR 58 | Temp 96.9°F | Ht 67.0 in | Wt 159.0 lb

## 2018-02-10 DIAGNOSIS — E785 Hyperlipidemia, unspecified: Secondary | ICD-10-CM | POA: Diagnosis not present

## 2018-02-10 DIAGNOSIS — I700 Atherosclerosis of aorta: Secondary | ICD-10-CM

## 2018-02-10 DIAGNOSIS — I10 Essential (primary) hypertension: Secondary | ICD-10-CM

## 2018-02-10 DIAGNOSIS — N183 Chronic kidney disease, stage 3 unspecified: Secondary | ICD-10-CM

## 2018-02-10 DIAGNOSIS — R3915 Urgency of urination: Secondary | ICD-10-CM | POA: Diagnosis not present

## 2018-02-10 DIAGNOSIS — E8881 Metabolic syndrome: Secondary | ICD-10-CM

## 2018-02-10 DIAGNOSIS — N3000 Acute cystitis without hematuria: Secondary | ICD-10-CM

## 2018-02-10 LAB — URINALYSIS, COMPLETE
Bilirubin, UA: NEGATIVE
GLUCOSE, UA: NEGATIVE
KETONES UA: NEGATIVE
NITRITE UA: POSITIVE — AB
PROTEIN UA: NEGATIVE
RBC, UA: NEGATIVE
SPEC GRAV UA: 1.02 (ref 1.005–1.030)
Urobilinogen, Ur: 0.2 mg/dL (ref 0.2–1.0)
pH, UA: 6 (ref 5.0–7.5)

## 2018-02-10 LAB — MICROSCOPIC EXAMINATION: RENAL EPITHEL UA: NONE SEEN /HPF

## 2018-02-10 LAB — BAYER DCA HB A1C WAIVED: HB A1C (BAYER DCA - WAIVED): 6.1 % (ref <7.0)

## 2018-02-10 IMAGING — DX DG CHEST 2V
2 series · 2 of 2 positions shown · non-contrast
Comparison: [DATE]

CLINICAL DATA: Aortic atherosclerosis.

EXAM:
CHEST - 2 VIEW

[chest pa]
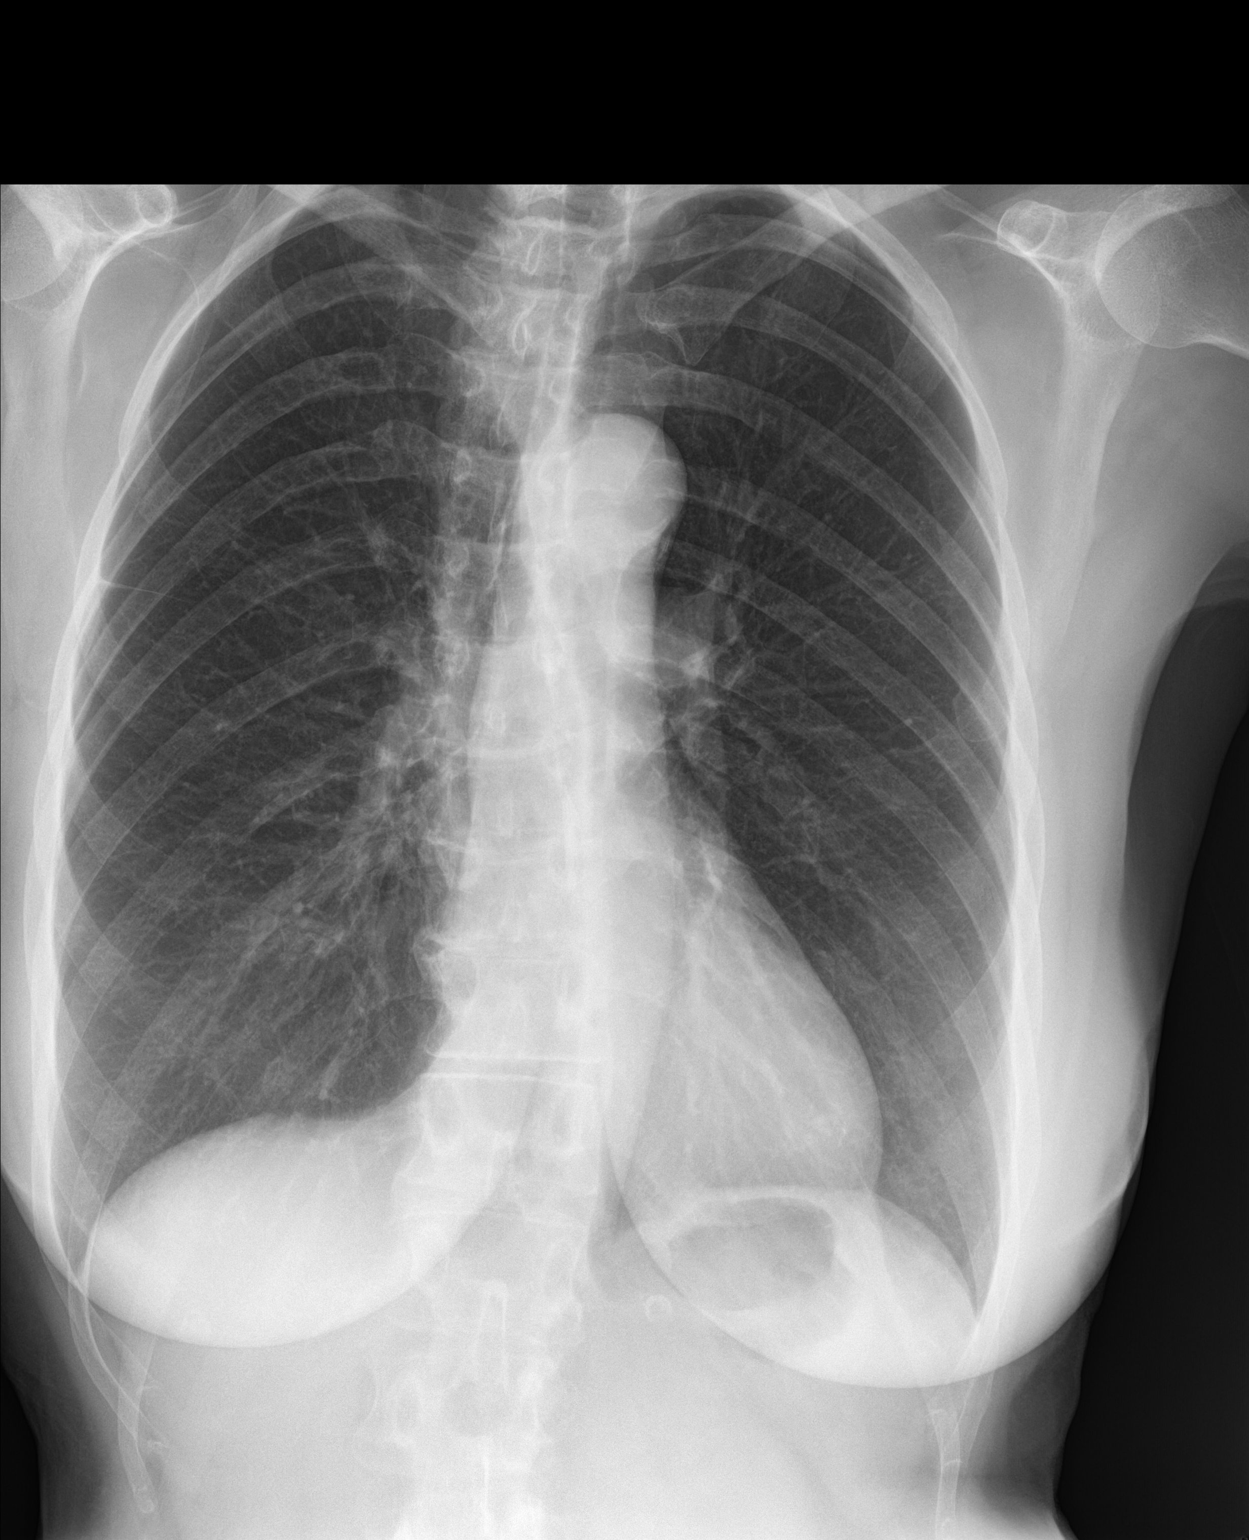

[chest lat]
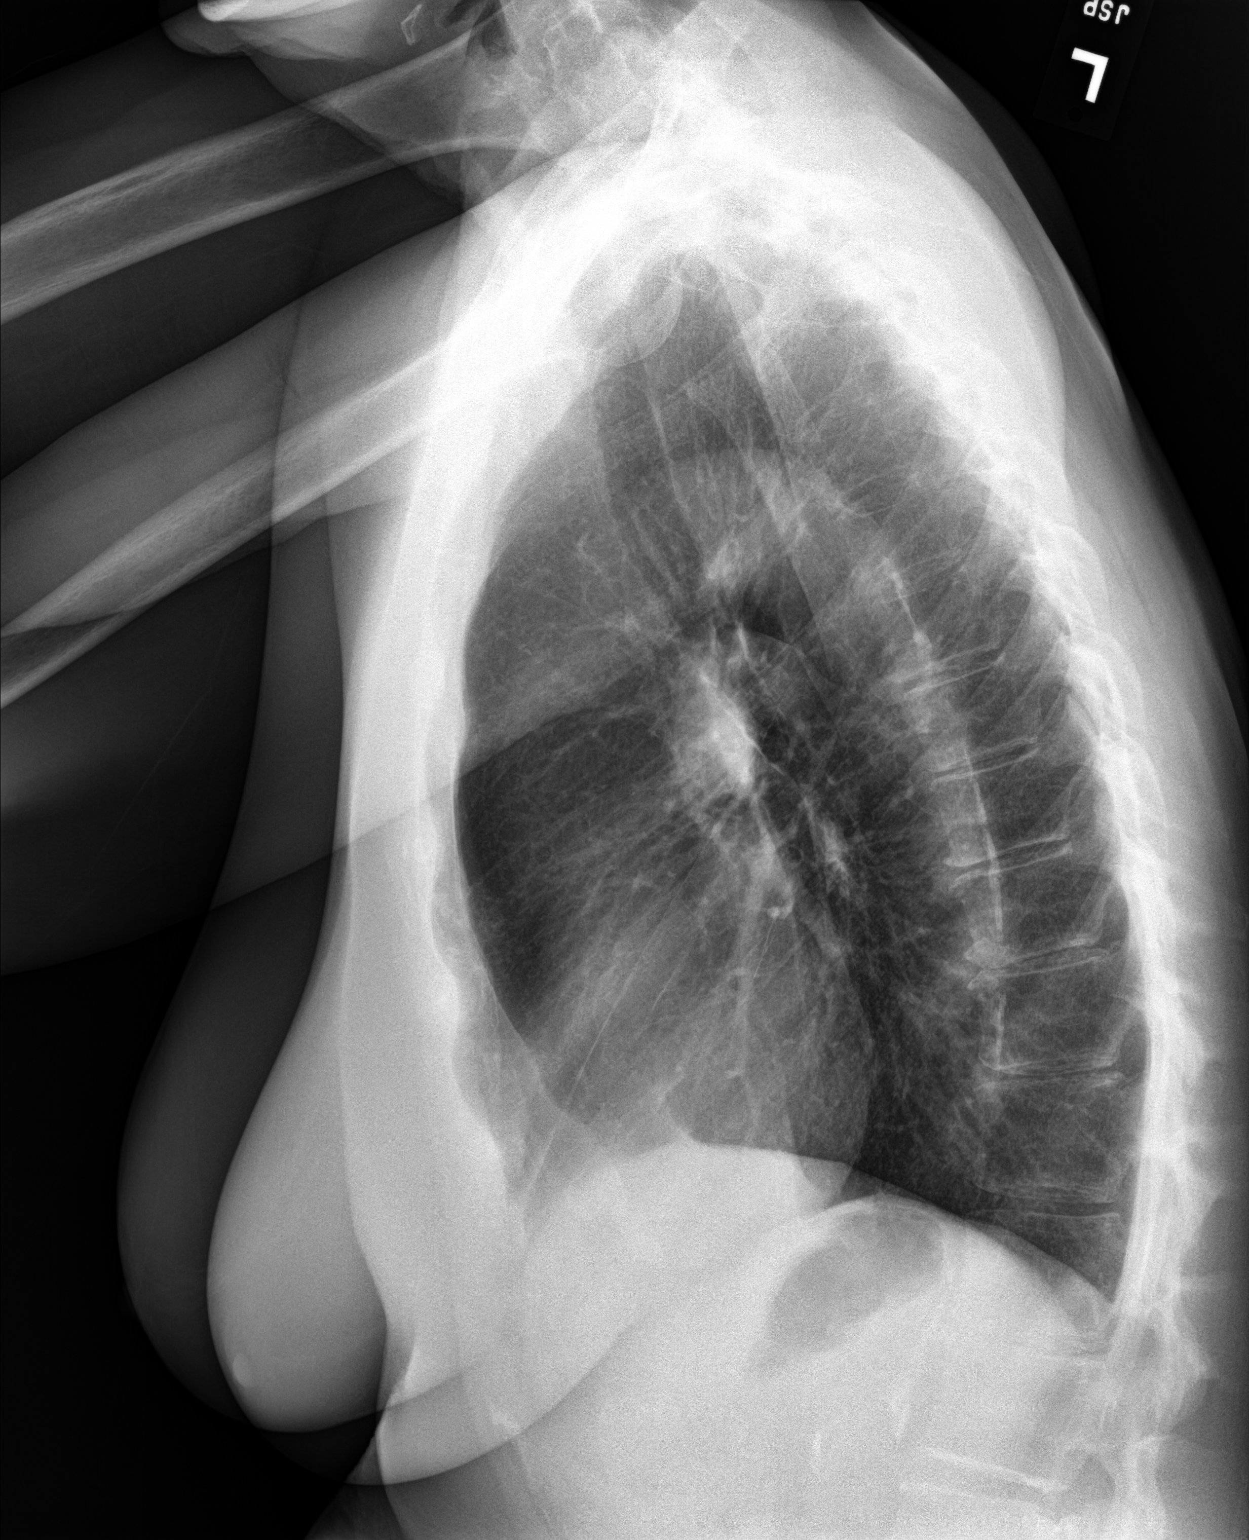

[2 of 2 positions shown; findings below may reference images not displayed]

FINDINGS: Lungs are clear. Heart size and pulmonary vascularity are normal. No
adenopathy. There is persistent aortic atherosclerosis which appears
grossly stable by radiography compared to prior study. No evident
bone lesions. There is stable mild scoliosis.
IMPRESSION: Aortic atherosclerosis, grossly stable from prior study. No edema or
consolidation. Stable cardiac silhouette.

Aortic Atherosclerosis ([IT]-[IT]).

## 2018-02-10 MED ORDER — CIPROFLOXACIN HCL 500 MG PO TABS: 500.0000 mg | ORAL_TABLET | Freq: Two times a day (BID) | ORAL | 0 refills | Status: DC

## 2018-02-10 MED ORDER — LISINOPRIL-HYDROCHLOROTHIAZIDE 20-12.5 MG PO TABS: 1.0000 | ORAL_TABLET | Freq: Every day | ORAL | 1 refills | Status: DC

## 2018-02-10 NOTE — Patient Instructions (Signed)

## 2018-02-10 NOTE — Addendum Note (Signed)
Addended by: Bennie PieriniMARTIN, MARY-MARGARET on: 02/10/2018 04:33 PM   Modules accepted: Orders

## 2018-02-10 NOTE — Progress Notes (Signed)
Subjective:    Patient ID: Priscilla Daniels, female    DOB: May 09, 1941, 77 y.o.   MRN: [id number]   Chief Complaint: Medical Management of Chronic Issues   HPI:  1. Essential hypertension  No c/o chest pain , sob or headache. Does not hceck blood pressure at home. BP Readings from Last 3 Encounters:  11/03/17 117/66  08/13/17 108/64  07/28/17 129/78     2. Atherosclerosis of aorta ([place])  Was seen on chest xray in 2016- will repeat chest xray today  3. CKD (chronic kidney disease), stage III (HCC)  Last creatine was 1.10 and GFR was 49. She denies any urinary problems  4. Metabolic syndrome dos not check her blood sugars at home.last hgba1c was 6.3%  5. Hyperlipidemia with target LDL less than 100  Tries to avoid fried foods. She stays very active but does no dedicated exercise    Outpatient Encounter Medications as of 02/10/2018  Medication Sig  . dorzolamide-timolol (COSOPT) 22.3-6.8 MG/ML ophthalmic solution   . lisinopril-hydrochlorothiazide (PRINZIDE,ZESTORETIC) 20-12.5 MG tablet Take 1 tablet by mouth daily.  . TRAVATAN Z 0.004 % SOLN ophthalmic solution       New complaints: Wants urine checked today while she is here even though really not having any symptoms other than occasional urgency. She did have a uti in May  2019.  Social history: Retired and lives alone.   Review of Systems  Constitutional: Negative for activity change and appetite change.  HENT: Negative.   Eyes: Negative for pain.  Respiratory: Negative for shortness of breath.   Cardiovascular: Negative for chest pain, palpitations and leg swelling.  Gastrointestinal: Negative for abdominal pain.  Endocrine: Negative for polydipsia.  Genitourinary: Negative.   Skin: Negative for rash.  Neurological: Negative for dizziness, weakness and headaches.  Hematological: Does not bruise/bleed easily.  Psychiatric/Behavioral: Negative.   All other systems reviewed and are negative.      Objective:     Physical Exam  Constitutional: She is oriented to person, place, and time. She appears well-developed and well-nourished.  HENT:  Head: Normocephalic.  Nose: Nose normal.  Mouth/Throat: Oropharynx is clear and moist.  Eyes: Pupils are equal, round, and reactive to light. EOM are normal.  Neck: Normal range of motion. Neck supple. No JVD present. Carotid bruit is not present.  Cardiovascular: Normal rate, regular rhythm, normal heart sounds and intact distal pulses.  Pulmonary/Chest: Effort normal and breath sounds normal. No respiratory distress. She has no wheezes. She has no rales. She exhibits no tenderness.  Abdominal: Soft. Normal appearance, normal aorta and bowel sounds are normal. She exhibits no distension, no abdominal bruit, no pulsatile midline mass and no mass. There is no splenomegaly or hepatomegaly. There is no tenderness.  Musculoskeletal: Normal range of motion. She exhibits no edema.  Lymphadenopathy:    She has no cervical adenopathy.  Neurological: She is alert and oriented to person, place, and time. She has normal reflexes.  Skin: Skin is warm and dry.  Psychiatric: She has a normal mood and affect. Her behavior is normal. Judgment and thought content normal.    BP 130/77   Pulse (!) 58   Temp (!) 96.9 F (36.1 C) (Oral)   Ht 5' 7"  (1.702 m)   Wt 159 lb (72.1 kg)   BMI 24.90 kg/m   urine + nitrites EKG- sinus bradycardia with occasional PAC's- Mary-Margaret Hassell Done, FNP Chest xray- no acute or chronic changes-Preliminary reading by Ronnald Collum, FNP  [facility]  Assessment & Plan:  SAFIATOU ISLAM comes in today with chief complaint of Medical Management of Chronic Issues   Diagnosis and orders addressed:  1. Essential hypertension Low sodium diet - CMP14+EGFR - EKG 12-Lead  2. Atherosclerosis of aorta ([place]) - DG Chest 2 View; Future  3. CKD (chronic kidney disease), stage III ([place]) Labs pending  4. Metabolic syndrome watch carbs in diet -  Bayer DCA Hb A1c Waived  5. Hyperlipidemia with target LDL less than 100 Low fat diet - Lipid panel  6. Urinary urgency Take medication as prescribe Cotton underwear Take shower not bath Cranberry juice, yogurt Force fluids AZO over the counter X2 days Culture pending RTO prn  - Urinalysis, Complete  7. Acute cystitis without hematuria - ciprofloxacin (CIPRO) 500 MG tablet; Take 1 tablet (500 mg total) by mouth 2 (two) times daily.  Dispense: 10 tablet; Refill: 0   Labs pending Health Maintenance reviewed Diet and exercise encouraged  Follow up plan: 6 months   Mary-Margaret Hassell Done, FNP

## 2018-02-11 LAB — CMP14+EGFR
A/G RATIO: 1.7 (ref 1.2–2.2)
ALBUMIN: 4.3 g/dL (ref 3.5–4.8)
ALK PHOS: 58 IU/L (ref 39–117)
ALT: 12 IU/L (ref 0–32)
AST: 17 IU/L (ref 0–40)
BUN / CREAT RATIO: 26 (ref 12–28)
BUN: 27 mg/dL (ref 8–27)
Bilirubin Total: 0.3 mg/dL (ref 0.0–1.2)
CO2: 23 mmol/L (ref 20–29)
CREATININE: 1.03 mg/dL — AB (ref 0.57–1.00)
Calcium: 9.5 mg/dL (ref 8.7–10.3)
Chloride: 99 mmol/L (ref 96–106)
GFR calc Af Amer: 61 mL/min/{1.73_m2} (ref >59)
GFR, EST NON AFRICAN AMERICAN: 53 mL/min/{1.73_m2} — AB (ref >59)
Globulin, Total: 2.5 g/dL (ref 1.5–4.5)
Glucose: 101 mg/dL — ABNORMAL HIGH (ref 65–99)
POTASSIUM: 4.5 mmol/L (ref 3.5–5.2)
SODIUM: 138 mmol/L (ref 134–144)
Total Protein: 6.8 g/dL (ref 6.0–8.5)

## 2018-02-11 LAB — LIPID PANEL
Chol/HDL Ratio: 2.1 ratio (ref 0.0–4.4)
Cholesterol, Total: 198 mg/dL (ref 100–199)
HDL: 95 mg/dL (ref >39)
LDL Calculated: 88 mg/dL (ref 0–99)
Triglycerides: 75 mg/dL (ref 0–149)
VLDL CHOLESTEROL CAL: 15 mg/dL (ref 5–40)

## 2018-02-13 LAB — URINE CULTURE

## 2018-02-14 ENCOUNTER — Other Ambulatory Visit: Payer: Self-pay | Admitting: Nurse Practitioner

## 2018-02-17 ENCOUNTER — Telehealth: Payer: Self-pay | Admitting: Nurse Practitioner

## 2018-02-17 NOTE — Telephone Encounter (Signed)
Pt aware of xray results

## 2018-03-31 ENCOUNTER — Ambulatory Visit (INDEPENDENT_AMBULATORY_CARE_PROVIDER_SITE_OTHER): Payer: Medicare Other

## 2018-03-31 DIAGNOSIS — Z23 Encounter for immunization: Secondary | ICD-10-CM

## 2018-04-11 ENCOUNTER — Ambulatory Visit (INDEPENDENT_AMBULATORY_CARE_PROVIDER_SITE_OTHER): Payer: Medicare Other | Admitting: Family Medicine

## 2018-04-11 ENCOUNTER — Encounter: Payer: Self-pay | Admitting: Family Medicine

## 2018-04-11 VITALS — BP 111/72 | HR 74 | Temp 96.8°F | Ht 67.0 in | Wt 158.2 lb

## 2018-04-11 DIAGNOSIS — N3000 Acute cystitis without hematuria: Secondary | ICD-10-CM | POA: Diagnosis not present

## 2018-04-11 DIAGNOSIS — R300 Dysuria: Secondary | ICD-10-CM | POA: Diagnosis not present

## 2018-04-11 LAB — URINALYSIS, COMPLETE
BILIRUBIN UA: NEGATIVE
Glucose, UA: NEGATIVE
KETONES UA: NEGATIVE
NITRITE UA: POSITIVE — AB
PH UA: 6 (ref 5.0–7.5)
SPEC GRAV UA: 1.025 (ref 1.005–1.030)
UUROB: 1 mg/dL (ref 0.2–1.0)

## 2018-04-11 LAB — MICROSCOPIC EXAMINATION

## 2018-04-11 MED ORDER — CIPROFLOXACIN HCL 500 MG PO TABS: 500.0000 mg | ORAL_TABLET | Freq: Two times a day (BID) | ORAL | 0 refills | Status: DC

## 2018-04-11 NOTE — Progress Notes (Signed)
BP 111/72   Pulse 74   Temp (!) 96.8 F (36 C) (Oral)   Ht 5\' 7"  (1.702 m)   Wt 71.8 kg   BMI 24.78 kg/m    Subjective:    Patient ID: Priscilla Daniels, female    DOB: 1941-02-21, 77 y.o.   MRN: [id number]  HPI: Priscilla Daniels is a 77 y.o. female presenting on 04/11/2018 for dysuria x 1 week. Patient reports that she had 1 episode of diarrhea over a week ago that possibly caused the symptoms. She tried to wash well that night, but the burning and pain started 1 week later with associated mild discomfort in the lower abdomen. She also reports increased frequency at night time. She denies fever, chills, hematuria, flank pain, vaginal itching/rash, chest pain, or sob. Her last UTI was in August and she was treated with cipro. Patient has been hydrating well.   Relevant past medical, surgical, family and social history reviewed and updated as indicated. Interim medical history since our last visit reviewed. Allergies and medications reviewed and updated.  Review of Systems  Constitutional: Negative for chills, fatigue and fever.  HENT: Negative.   Respiratory: Negative.   Cardiovascular: Negative.   Gastrointestinal: Negative for diarrhea, nausea and vomiting.  Genitourinary: Positive for dysuria and frequency (at night). Negative for flank pain and hematuria.  Neurological: Negative.     Per HPI unless specifically indicated above      Objective:    BP 111/72   Pulse 74   Temp (!) 96.8 F (36 C) (Oral)   Ht 5\' 7"  (1.702 m)   Wt 71.8 kg   BMI 24.78 kg/m   Wt Readings from Last 3 Encounters:  04/11/18 71.8 kg  02/10/18 72.1 kg  11/03/17 72.6 kg    Physical Exam  Constitutional: She appears well-developed and well-nourished. No distress.  HENT:  Head: Normocephalic and atraumatic.  Neck: Normal range of motion. Neck supple.  Cardiovascular: Normal rate, regular rhythm, normal heart sounds and intact distal pulses.  Pulmonary/Chest: Effort normal and breath sounds  normal.  Abdominal: Soft. Bowel sounds are normal.  Skin: Skin is warm.  Psychiatric: She has a normal mood and affect.    Results for orders placed or performed in visit on 04/11/18  Microscopic Examination  Result Value Ref Range   WBC, UA >30 (A) 0 - 5 /hpf   RBC, UA 11-30 (A) 0 - 2 /hpf   Epithelial Cells (non renal) 0-10 0 - 10 /hpf   Renal Epithel, UA 0-10 (A) None seen /hpf   Bacteria, UA Many (A) None seen/Few  Urinalysis, Complete  Result Value Ref Range   Specific Gravity, UA 1.025 1.005 - 1.030   pH, UA 6.0 5.0 - 7.5   Color, UA Yellow Yellow   Appearance Ur Clear Clear   Leukocytes, UA 3+ (A) Negative   Protein, UA 2+ (A) Negative/Trace   Glucose, UA Negative Negative   Ketones, UA Negative Negative   RBC, UA 2+ (A) Negative   Bilirubin, UA Negative Negative   Urobilinogen, Ur 1.0 0.2 - 1.0 mg/dL   Nitrite, UA Positive (A) Negative   Microscopic Examination See below:       Assessment & Plan:   Problem List Items Addressed This Visit    None    Visit Diagnoses    Acute cystitis without hematuria    -  Primary   Relevant Medications   ciprofloxacin (CIPRO) 500 MG tablet   Other Relevant Orders  Urinalysis, Complete (Completed)    UTI Presents with dysuria x 1 week. She denies fever, flank pain, or hematuria. UA was positive for leukocytes and nitrites. Patient did not leave enough urine for a culture. Will treat with cipro. Follow up if symptoms don't improve.   Follow up plan: Return if symptoms worsen or fail to improve.  Counseling provided for all of the vaccine components Orders Placed This Encounter  Procedures  . Urinalysis, Complete   Patient seen and examined with Cadence Fransico Michael PA student and agree with assessment and plan above Arville Care, MD [facility] 04/11/2018, 10:17 AM

## 2018-04-18 DIAGNOSIS — H3412 Central retinal artery occlusion, left eye: Secondary | ICD-10-CM | POA: Diagnosis not present

## 2018-04-18 DIAGNOSIS — H353111 Nonexudative age-related macular degeneration, right eye, early dry stage: Secondary | ICD-10-CM | POA: Diagnosis not present

## 2018-04-18 DIAGNOSIS — H353222 Exudative age-related macular degeneration, left eye, with inactive choroidal neovascularization: Secondary | ICD-10-CM | POA: Diagnosis not present

## 2018-04-18 DIAGNOSIS — H401131 Primary open-angle glaucoma, bilateral, mild stage: Secondary | ICD-10-CM | POA: Diagnosis not present

## 2018-06-27 ENCOUNTER — Ambulatory Visit (INDEPENDENT_AMBULATORY_CARE_PROVIDER_SITE_OTHER): Payer: Medicare Other | Admitting: Nurse Practitioner

## 2018-06-27 ENCOUNTER — Encounter: Payer: Self-pay | Admitting: Nurse Practitioner

## 2018-06-27 VITALS — BP 137/85 | HR 77 | Temp 98.0°F | Ht 67.0 in | Wt 158.0 lb

## 2018-06-27 DIAGNOSIS — N3000 Acute cystitis without hematuria: Secondary | ICD-10-CM

## 2018-06-27 DIAGNOSIS — R300 Dysuria: Secondary | ICD-10-CM

## 2018-06-27 LAB — MICROSCOPIC EXAMINATION
Renal Epithel, UA: NONE SEEN /hpf
WBC, UA: >30 /hpf — AB (ref 0–5)

## 2018-06-27 LAB — URINALYSIS, COMPLETE
Bilirubin, UA: NEGATIVE
Glucose, UA: NEGATIVE
Nitrite, UA: POSITIVE — AB
Specific Gravity, UA: 1.02 (ref 1.005–1.030)
Urobilinogen, Ur: 1 mg/dL (ref 0.2–1.0)
pH, UA: 6.5 (ref 5.0–7.5)

## 2018-06-27 MED ORDER — CIPROFLOXACIN HCL 500 MG PO TABS: 500.0000 mg | ORAL_TABLET | Freq: Two times a day (BID) | ORAL | 0 refills | Status: DC

## 2018-06-27 NOTE — Progress Notes (Signed)
   Subjective:    Patient ID: Priscilla Daniels, female    DOB: [birth year], 78 y.o.   MRN: [id number]   Chief Complaint: Dysuria   HPI Patient comes in c/o dysuria and frequency that started 2 ays ago. Today she is having a ard time voiding.   Review of Systems  Constitutional: Positive for chills.  HENT: Negative.   Respiratory: Negative.   Cardiovascular: Negative.   Gastrointestinal: Negative.   Genitourinary: Positive for dysuria, frequency and urgency.  Neurological: Negative.   Psychiatric/Behavioral: Negative.   All other systems reviewed and are negative.      Objective:   Physical Exam Constitutional:      General: She is not in acute distress.    Appearance: Normal appearance. She is normal weight.  Cardiovascular:     Rate and Rhythm: Normal rate and regular rhythm.     Pulses: Normal pulses.     Heart sounds: Normal heart sounds.  Pulmonary:     Effort: Pulmonary effort is normal.     Breath sounds: Normal breath sounds.  Abdominal:     General: Abdomen is flat. Bowel sounds are normal.     Palpations: Abdomen is soft.  Genitourinary:    Comments: No CVA tenderness Skin:    General: Skin is warm.  Neurological:     General: No focal deficit present.     Mental Status: She is alert and oriented to person, place, and time.  Psychiatric:        Mood and Affect: Mood normal.        Behavior: Behavior normal.     BP 137/85   Pulse 77   Temp 98 F (36.7 C) (Oral)   Ht 5\' 7"  (1.702 m)   Wt 158 lb (71.7 kg)   BMI 24.75 kg/m   UA- (+) nitrites (2+) leuks      Assessment & Plan:  Priscilla Daniels in today with chief complaint of Dysuria   1. Dysuria - Urinalysis, Complete  2. Acute cystitis without hematuria Take medication as prescribe Cotton underwear Take shower not bath Cranberry juice, yogurt Force fluids AZO over the counter X2 days Culture pending RTO prn  - ciprofloxacin (CIPRO) 500 MG tablet; Take 1 tablet (500 mg total) by  mouth 2 (two) times daily.  Dispense: 10 tablet; Refill: 0 - Urine Culture  Mary-Margaret Daphine Deutscher, FNP'

## 2018-06-27 NOTE — Patient Instructions (Signed)
Urinary Tract Infection, Adult A urinary tract infection (UTI) is an infection of any part of the urinary tract. The urinary tract includes:  The kidneys.  The ureters.  The bladder.  The urethra. These organs make, store, and get rid of pee (urine) in the body. What are the causes? This is caused by germs (bacteria) in your genital area. These germs grow and cause swelling (inflammation) of your urinary tract. What increases the risk? You are more likely to develop this condition if:  You have a small, thin tube (catheter) to drain pee.  You cannot control when you pee or poop (incontinence).  You are female, and: ? You use these methods to prevent pregnancy: ? A medicine that kills sperm (spermicide). ? A device that blocks sperm (diaphragm). ? You have low levels of a female hormone (estrogen). ? You are pregnant.  You have genes that add to your risk.  You are sexually active.  You take antibiotic medicines.  You have trouble peeing because of: ? A prostate that is bigger than normal, if you are female. ? A blockage in the part of your body that drains pee from the bladder (urethra). ? A kidney stone. ? A nerve condition that affects your bladder (neurogenic bladder). ? Not getting enough to drink. ? Not peeing often enough.  You have other conditions, such as: ? Diabetes. ? A weak disease-fighting system (immune system). ? Sickle cell disease. ? Gout. ? Injury of the spine. What are the signs or symptoms? Symptoms of this condition include:  Needing to pee right away (urgently).  Peeing often.  Peeing small amounts often.  Pain or burning when peeing.  Blood in the pee.  Pee that smells bad or not like normal.  Trouble peeing.  Pee that is cloudy.  Fluid coming from the vagina, if you are female.  Pain in the belly or lower back. Other symptoms include:  Throwing up (vomiting).  No urge to eat.  Feeling mixed up (confused).  Being tired  and grouchy (irritable).  A fever.  Watery poop (diarrhea). How is this treated? This condition may be treated with:  Antibiotic medicine.  Other medicines.  Drinking enough water. Follow these instructions at home:  Medicines  Take over-the-counter and prescription medicines only as told by your doctor.  If you were prescribed an antibiotic medicine, take it as told by your doctor. Do not stop taking it even if you start to feel better. General instructions  Make sure you: ? Pee until your bladder is empty. ? Do not hold pee for a long time. ? Empty your bladder after sex. ? Wipe from front to back after pooping if you are a female. Use each tissue one time when you wipe.  Drink enough fluid to keep your pee pale yellow.  Keep all follow-up visits as told by your doctor. This is important. Contact a doctor if:  You do not get better after 1-2 days.  Your symptoms go away and then come back. Get help right away if:  You have very bad back pain.  You have very bad pain in your lower belly.  You have a fever.  You are sick to your stomach (nauseous).  You are throwing up. Summary  A urinary tract infection (UTI) is an infection of any part of the urinary tract.  This condition is caused by germs in your genital area.  There are many risk factors for a UTI. These include having a small, thin   tube to drain pee and not being able to control when you pee or poop.  Treatment includes antibiotic medicines for germs.  Drink enough fluid to keep your pee pale yellow. This information is not intended to replace advice given to you by your health care provider. Make sure you discuss any questions you have with your health care provider. Document Released: 11/25/2007 Document Revised: 12/16/2017 Document Reviewed: 12/16/2017 Elsevier Interactive Patient Education  2019 Elsevier Inc.  

## 2018-06-30 LAB — URINE CULTURE

## 2018-07-08 ENCOUNTER — Telehealth: Payer: Self-pay | Admitting: Nurse Practitioner

## 2018-07-08 ENCOUNTER — Ambulatory Visit (INDEPENDENT_AMBULATORY_CARE_PROVIDER_SITE_OTHER): Payer: Medicare Other | Admitting: Family Medicine

## 2018-07-08 ENCOUNTER — Encounter: Payer: Self-pay | Admitting: Family Medicine

## 2018-07-08 VITALS — BP 136/78 | HR 66 | Temp 96.8°F | Ht 67.0 in | Wt 157.0 lb

## 2018-07-08 DIAGNOSIS — S00531A Contusion of lip, initial encounter: Secondary | ICD-10-CM | POA: Diagnosis not present

## 2018-07-08 NOTE — Telephone Encounter (Signed)
PT daughter has called wanting to know if what the pt was seen for today could be a busted blood vessel, please call daughter on her work number its to [place] just ask for [employer]

## 2018-07-08 NOTE — Progress Notes (Signed)
.     Subjective:    Patient ID: Priscilla Daniels, female    DOB: 1941-04-27, 78 y.o.   MRN: [id number]  Chief Complaint:  Top lip swollen (began last night while eating a salad?)   HPI: Priscilla Daniels is a 78 y.o. female presenting on 07/08/2018 for Top lip swollen (began last night while eating a salad?)  Pt presents today for upper lip swelling. She reports this started last night after eating salad and a tortilla role. Pt states she has eaten these things before without difficulty. Pt states she is unsure if she hit her lip on anything, states she does bit her lip on accident from time to time. She denies shortness of breath, throat swelling, or trouble swallowing. She reports she has used ice with moderate relief of the symptoms.   Relevant past medical, surgical, family, and social history reviewed and updated as indicated.  Allergies and medications reviewed and updated.   Past Medical History:  Diagnosis Date  . Abnormal vaginal Pap smear   . Candidiasis    cuteneous   . Chronic UTI   . COPD (chronic obstructive pulmonary disease) (HCC) 2010   Dx by CXR   . DM (diabetes mellitus) (HCC)   . Fatigue   . Glaucoma   . Hyperlipidemia    Diet Controlled   . NIDDM (non-insulin dependent diabetes mellitus)   . Phlebitis   . Strep pharyngitis   . Vaginal atrophy   . Vertigo     Past Surgical History:  Procedure Laterality Date  . CERVICAL CONE BIOPSY    . GLAUCOMA SURGERY  12/04 & 07/05/03  . left eye cataract surgery  01/29/2009  . right eye cataract surgery  01/13/09  . VARICOSE VEIN SURGERY     both legs     Social History   Socioeconomic History  . Marital status: Divorced    Spouse name: Not on file  . Number of children: Not on file  . Years of education: Not on file  . Highest education level: Not on file  Occupational History  . Not on file  Social Needs  . Financial resource strain: Not on file  . Food insecurity:    Worry: Not on file    Inability:  Not on file  . Transportation needs:    Medical: Not on file    Non-medical: Not on file  Tobacco Use  . Smoking status: Never Smoker  . Smokeless tobacco: Never Used  Substance and Sexual Activity  . Alcohol use: No  . Drug use: No  . Sexual activity: Never  Lifestyle  . Physical activity:    Days per week: Not on file    Minutes per session: Not on file  . Stress: Not on file  Relationships  . Social connections:    Talks on phone: Not on file    Gets together: Not on file    Attends religious service: Not on file    Active member of club or organization: Not on file    Attends meetings of clubs or organizations: Not on file    Relationship status: Not on file  . Intimate partner violence:    Fear of current or ex partner: Not on file    Emotionally abused: Not on file    Physically abused: Not on file    Forced sexual activity: Not on file  Other Topics Concern  . Not on file  Social History Narrative  . Not on  file    Outpatient Encounter Medications as of 07/08/2018  Medication Sig  . dorzolamide-timolol (COSOPT) 22.3-6.8 MG/ML ophthalmic solution   . lisinopril-hydrochlorothiazide (PRINZIDE,ZESTORETIC) 20-12.5 MG tablet Take 1 tablet by mouth daily.  . TRAVATAN Z 0.004 % SOLN ophthalmic solution   . [DISCONTINUED] ciprofloxacin (CIPRO) 500 MG tablet Take 1 tablet (500 mg total) by mouth 2 (two) times daily.   No facility-administered encounter medications on file as of 07/08/2018.     Allergies  Allergen Reactions  . Lipitor [Atorvastatin Calcium]   . Septra [Bactrim]   . Sulfa Antibiotics     Review of Systems  Constitutional: Negative for chills, fatigue and fever.  HENT: Negative for sore throat, trouble swallowing and voice change.        Upper lip swelling  Respiratory: Negative for apnea, cough, choking, chest tightness, shortness of breath, wheezing and stridor.   Cardiovascular: Negative for chest pain and palpitations.  Neurological: Negative for  weakness and headaches.  Psychiatric/Behavioral: Negative for confusion.  All other systems reviewed and are negative.       Objective:    BP 136/78   Pulse 66   Temp (!) 96.8 F (36 C) (Oral)   Ht 5\' 7"  (1.702 m)   Wt 157 lb (71.2 kg)   BMI 24.59 kg/m    Wt Readings from Last 3 Encounters:  07/08/18 157 lb (71.2 kg)  06/27/18 158 lb (71.7 kg)  04/11/18 158 lb 3.2 oz (71.8 kg)    Physical Exam Vitals signs and nursing note reviewed.  Constitutional:      General: She is not in acute distress.    Appearance: Normal appearance. She is well-developed and well-groomed. She is not ill-appearing or toxic-appearing.    HENT:     Head: Normocephalic.     Right Ear: Hearing normal.     Nose: Nose normal.     Mouth/Throat:     Lips: Pink.     Mouth: Mucous membranes are moist.     Pharynx: Oropharynx is clear.   Eyes:     Conjunctiva/sclera: Conjunctivae normal.     Pupils: Pupils are equal, round, and reactive to light.  Cardiovascular:     Rate and Rhythm: Normal rate and regular rhythm.     Heart sounds: Normal heart sounds.  Pulmonary:     Effort: Pulmonary effort is normal. No respiratory distress.     Breath sounds: Normal breath sounds.  Skin:    General: Skin is warm and dry.     Capillary Refill: Capillary refill takes less than 2 seconds.  Neurological:     General: No focal deficit present.     Mental Status: She is alert and oriented to person, place, and time.  Psychiatric:        Mood and Affect: Mood normal.        Behavior: Behavior normal. Behavior is cooperative.        Thought Content: Thought content normal.        Judgment: Judgment normal.     Results for orders placed or performed in visit on 06/27/18  Urine Culture  Result Value Ref Range   Urine Culture, Routine Final report (A)    Organism ID, Bacteria Comment (A)    Antimicrobial Susceptibility Comment   Microscopic Examination  Result Value Ref Range   WBC, UA >30 (A) 0 - 5 /hpf    RBC, UA 0-2 0 - 2 /hpf   Epithelial Cells (non renal) 0-10 0 - 10 /  hpf   Renal Epithel, UA None seen None seen /hpf   Bacteria, UA Many (A) None seen/Few  Urinalysis, Complete  Result Value Ref Range   Specific Gravity, UA 1.020 1.005 - 1.030   pH, UA 6.5 5.0 - 7.5   Color, UA Yellow Yellow   Appearance Ur Cloudy (A) Clear   Leukocytes, UA 2+ (A) Negative   Protein, UA Trace (A) Negative/Trace   Glucose, UA Negative Negative   Ketones, UA Trace (A) Negative   RBC, UA Trace (A) Negative   Bilirubin, UA Negative Negative   Urobilinogen, Ur 1.0 0.2 - 1.0 mg/dL   Nitrite, UA Positive (A) Negative   Microscopic Examination See below:        Pertinent labs & imaging results that were available during my care of the patient were reviewed by me and considered in my medical decision making.  Assessment & Plan:  Sanii was seen today for top lip swollen.  Diagnoses and all orders for this visit:  Contusion of lip, initial encounter Ice area several times per day. Make sure to protect the skin from direct ice contact. Report any new or worsening symptoms.    Continue all other maintenance medications.  Follow up plan: Return if symptoms worsen or fail to improve.  Educational handout given for contusion   The above assessment and management plan was discussed with the patient. The patient verbalized understanding of and has agreed to the management plan. Patient is aware to call the clinic if symptoms persist or worsen. Patient is aware when to return to the clinic for a follow-up visit. Patient educated on when it is appropriate to go to the emergency department.   Kari Baars, FNP-C [facility] [phone]

## 2018-07-08 NOTE — Patient Instructions (Signed)
Contusion    A contusion is a deep bruise. Contusions happen when an injury causes bleeding under the skin. Symptoms of bruising include pain, swelling, and discolored skin. The skin may turn blue, purple, or yellow.  Follow these instructions at home:   Rest the injured area.   If told, put ice on the injured area.  ? Put ice in a plastic bag.  ? Place a towel between your skin and the bag.  ? Leave the ice on for 20 minutes, 2-3 times per day.   If told, put light pressure (compression) on the injured area using an elastic bandage. Make sure the bandage is not too tight. Remove it and put it back on as told by your doctor.   If possible, raise (elevate) the injured area above the level of your heart while you are sitting or lying down.   Take over-the-counter and prescription medicines only as told by your doctor.  Contact a doctor if:   Your symptoms do not get better after several days of treatment.   Your symptoms get worse.   You have trouble moving the injured area.  Get help right away if:   You have very bad pain.   You have a loss of feeling (numbness) in a hand or foot.   Your hand or foot turns pale or cold.  This information is not intended to replace advice given to you by your health care provider. Make sure you discuss any questions you have with your health care provider.  Document Released: 11/25/2007 Document Revised: 11/14/2015 Document Reviewed: 10/24/2014  Elsevier Interactive Patient Education  2019 [employer]vier Inc.

## 2018-07-08 NOTE — Telephone Encounter (Signed)
It is a contusion, looked more like she hit her lip on something. You usually do not get busted blood vessels in the lip

## 2018-07-08 NOTE — Telephone Encounter (Signed)
Please advise 

## 2018-07-13 DIAGNOSIS — Z961 Presence of intraocular lens: Secondary | ICD-10-CM | POA: Diagnosis not present

## 2018-07-13 DIAGNOSIS — H401131 Primary open-angle glaucoma, bilateral, mild stage: Secondary | ICD-10-CM | POA: Diagnosis not present

## 2018-07-15 NOTE — Telephone Encounter (Signed)
Attempt to contact pt without return call in over 3 days, will close encounter. 

## 2018-08-15 ENCOUNTER — Ambulatory Visit (INDEPENDENT_AMBULATORY_CARE_PROVIDER_SITE_OTHER): Payer: Medicare Other

## 2018-08-15 ENCOUNTER — Other Ambulatory Visit: Payer: Self-pay

## 2018-08-15 VITALS — BP 140/82 | HR 66 | Temp 98.4°F | Ht 67.0 in | Wt 158.0 lb

## 2018-08-15 DIAGNOSIS — Z0000 Encounter for general adult medical examination without abnormal findings: Secondary | ICD-10-CM

## 2018-08-15 DIAGNOSIS — R300 Dysuria: Secondary | ICD-10-CM

## 2018-08-15 LAB — URINALYSIS, COMPLETE
BILIRUBIN UA: NEGATIVE
Glucose, UA: NEGATIVE
Ketones, UA: NEGATIVE
Nitrite, UA: NEGATIVE
Protein, UA: NEGATIVE
RBC, UA: NEGATIVE
Specific Gravity, UA: 1.025 (ref 1.005–1.030)
UUROB: 0.2 mg/dL (ref 0.2–1.0)
pH, UA: 5.5 (ref 5.0–7.5)

## 2018-08-15 LAB — MICROSCOPIC EXAMINATION
Epithelial Cells (non renal): >10 /hpf — AB (ref 0–10)
Renal Epithel, UA: NONE SEEN /hpf

## 2018-08-15 NOTE — Patient Instructions (Signed)
  Priscilla Daniels , Thank you for taking time to come for your Medicare Wellness Visit. I appreciate your ongoing commitment to your health goals. Please review the following plan we discussed and let me know if I can assist you in the future.   These are the goals we discussed: Goals    . Exercise 150 min/wk Moderate Activity    . Have 3 meals a day       This is a list of the screening recommended for you and due dates:  Health Maintenance  Topic Date Due  . Pap Smear  09/20/2013  . Eye exam for diabetics  05/21/2017  . Hemoglobin A1C  08/13/2018  . DEXA scan (bone density measurement)  02/11/2019*  . Tetanus Vaccine  02/11/2019*  . Pneumonia vaccines (1 of 2 - PCV13) 02/11/2019*  . Complete foot exam   02/11/2019  . Mammogram  04/01/2019  . Flu Shot  Completed  *Topic was postponed. The date shown is not the original due date.
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
## 2018-08-16 LAB — URINE CULTURE

## 2018-08-22 ENCOUNTER — Telehealth: Payer: Self-pay | Admitting: Nurse Practitioner

## 2018-08-22 NOTE — Telephone Encounter (Signed)
Aware of urine culture results and recommendations.  Patient declines seeing a urologist

## 2018-08-22 NOTE — Telephone Encounter (Signed)
Urine culture did not grow anything s previously discuussed- does not need I=any meds but does need to see urology because she gets these to often.

## 2018-08-29 ENCOUNTER — Encounter: Payer: Self-pay | Admitting: Nurse Practitioner

## 2018-08-29 ENCOUNTER — Ambulatory Visit (INDEPENDENT_AMBULATORY_CARE_PROVIDER_SITE_OTHER): Payer: Medicare Other | Admitting: Nurse Practitioner

## 2018-08-29 VITALS — BP 127/78 | HR 61 | Temp 97.0°F | Ht 67.0 in | Wt 154.0 lb

## 2018-08-29 DIAGNOSIS — N183 Chronic kidney disease, stage 3 unspecified: Secondary | ICD-10-CM

## 2018-08-29 DIAGNOSIS — E8881 Metabolic syndrome: Secondary | ICD-10-CM | POA: Diagnosis not present

## 2018-08-29 DIAGNOSIS — I700 Atherosclerosis of aorta: Secondary | ICD-10-CM | POA: Diagnosis not present

## 2018-08-29 DIAGNOSIS — E785 Hyperlipidemia, unspecified: Secondary | ICD-10-CM | POA: Diagnosis not present

## 2018-08-29 DIAGNOSIS — I10 Essential (primary) hypertension: Secondary | ICD-10-CM | POA: Diagnosis not present

## 2018-08-29 DIAGNOSIS — R739 Hyperglycemia, unspecified: Secondary | ICD-10-CM | POA: Diagnosis not present

## 2018-08-29 DIAGNOSIS — N390 Urinary tract infection, site not specified: Secondary | ICD-10-CM

## 2018-08-29 LAB — BAYER DCA HB A1C WAIVED: HB A1C (BAYER DCA - WAIVED): 6.2 % (ref <7.0)

## 2018-08-29 MED ORDER — LISINOPRIL-HYDROCHLOROTHIAZIDE 20-12.5 MG PO TABS: 1.0000 | ORAL_TABLET | Freq: Every day | ORAL | 1 refills | Status: DC

## 2018-08-29 NOTE — Progress Notes (Signed)
Subjective:    Patient ID: Priscilla Daniels, female    DOB: 02-12-1941, 78 y.o.   MRN: [id number]   Chief Complaint: No chief complaint on file.   HPI:  1. Essential hypertension  No c/o chest pain, sob or headache. Does not check blood pressure at home. BP Readings from Last 3 Encounters:  08/15/18 140/82  07/08/18 136/78  06/27/18 137/85     2. Hyperlipidemia with target LDL less than 100  Does not watch diet and does no exercise  3. CKD (chronic kidney disease), stage III (HCC)  Last creatine was 1.03. we check it at app follow up appointments  4. Metabolic syndrome  She does not check blood sugar at home , but knows she is suppose to watch carbs in diet. Her last hgba1c was 6.1%  5. Atherosclerosis of aorta ([place])  This was seen on previous chest xray. Again sh e denies any sob  Or chest pain.    Outpatient Encounter Medications as of 08/29/2018  Medication Sig  . dorzolamide-timolol (COSOPT) 22.3-6.8 MG/ML ophthalmic solution   . lisinopril-hydrochlorothiazide (PRINZIDE,ZESTORETIC) 20-12.5 MG tablet Take 1 tablet by mouth daily.  . TRAVATAN Z 0.004 % SOLN ophthalmic solution       New complaints: Patient has very frequent UTI and it was suggested a week ago that she go see urologist , but patient refused. Sh ehas changed her mind and would like to see urologist.  Social history: Her daughter still lives with her.    Review of Systems  Constitutional: Negative for activity change and appetite change.  HENT: Negative.   Eyes: Negative for pain.  Respiratory: Negative for shortness of breath.   Cardiovascular: Negative for chest pain, palpitations and leg swelling.  Gastrointestinal: Negative for abdominal pain.  Endocrine: Negative for polydipsia.  Genitourinary: Negative.   Skin: Negative for rash.  Neurological: Negative for dizziness, weakness and headaches.  Hematological: Does not bruise/bleed easily.  Psychiatric/Behavioral: Negative.   All other  systems reviewed and are negative.      Objective:   Physical Exam Vitals signs and nursing note reviewed.  Constitutional:      General: She is not in acute distress.    Appearance: Normal appearance. She is well-developed.  HENT:     Head: Normocephalic.     Nose: Nose normal.  Eyes:     Pupils: Pupils are equal, round, and reactive to light.  Neck:     Musculoskeletal: Normal range of motion and neck supple.     Vascular: No carotid bruit or JVD.  Cardiovascular:     Rate and Rhythm: Normal rate and regular rhythm.     Heart sounds: Normal heart sounds.  Pulmonary:     Effort: Pulmonary effort is normal. No respiratory distress.     Breath sounds: Normal breath sounds. No wheezing or rales.  Chest:     Chest wall: No tenderness.  Abdominal:     General: Bowel sounds are normal. There is no distension or abdominal bruit.     Palpations: Abdomen is soft. There is no hepatomegaly, splenomegaly, mass or pulsatile mass.     Tenderness: There is no abdominal tenderness.  Musculoskeletal: Normal range of motion.  Lymphadenopathy:     Cervical: No cervical adenopathy.  Skin:    General: Skin is warm and dry.     Comments: Varicose veins bil lower ext  Neurological:     Mental Status: She is alert and oriented to person, place, and time.  Deep Tendon Reflexes: Reflexes are normal and symmetric.  Psychiatric:        Behavior: Behavior normal.        Thought Content: Thought content normal.        Judgment: Judgment normal.     BP 127/78   Pulse 61   Temp (!) 97 F (36.1 C) (Oral)   Ht 5' 7"  (1.702 m)   Wt 154 lb (69.9 kg)   BMI 24.12 kg/m   hgba1c 6.2%     Assessment & Plan:  Priscilla Daniels comes in today with chief complaint of Medical Management of Chronic Issues   Diagnosis and orders addressed:  1. Essential hypertension Low sodium diet - CMP14+EGFR - lisinopril-hydrochlorothiazide (PRINZIDE,ZESTORETIC) 20-12.5 MG tablet; Take 1 tablet by mouth  daily.  Dispense: 90 tablet; Refill: 1  2. Hyperlipidemia with target LDL less than 100 Low fat diet - Lipid panel  3. CKD (chronic kidney disease), stage III ([place]) Labs pending  4. Metabolic syndrome Watch crabs in diet - Bayer DCA Hb A1c Waived  5. Atherosclerosis of aorta ([place])  6. Recurrent UTI Continue to drinks 48z glasses of water a day. - Ambulatory referral to Urology   Labs pending Health Maintenance reviewed Diet and exercise encouraged  Follow up plan: 6 months   Mary-Margaret Hassell Done, FNP

## 2018-08-30 LAB — CMP14+EGFR
A/G RATIO: 1.6 (ref 1.2–2.2)
ALT: 12 IU/L (ref 0–32)
AST: 16 IU/L (ref 0–40)
Albumin: 4.2 g/dL (ref 3.7–4.7)
Alkaline Phosphatase: 54 IU/L (ref 39–117)
BUN/Creatinine Ratio: 29 — ABNORMAL HIGH (ref 12–28)
BUN: 35 mg/dL — ABNORMAL HIGH (ref 8–27)
Bilirubin Total: 0.3 mg/dL (ref 0.0–1.2)
CO2: 24 mmol/L (ref 20–29)
Calcium: 9.3 mg/dL (ref 8.7–10.3)
Chloride: 100 mmol/L (ref 96–106)
Creatinine, Ser: 1.22 mg/dL — ABNORMAL HIGH (ref 0.57–1.00)
GFR calc non Af Amer: 43 mL/min/{1.73_m2} — ABNORMAL LOW (ref >59)
GFR, EST AFRICAN AMERICAN: 49 mL/min/{1.73_m2} — AB (ref >59)
Globulin, Total: 2.6 g/dL (ref 1.5–4.5)
Glucose: 98 mg/dL (ref 65–99)
Potassium: 4.3 mmol/L (ref 3.5–5.2)
Sodium: 139 mmol/L (ref 134–144)
Total Protein: 6.8 g/dL (ref 6.0–8.5)

## 2018-08-30 LAB — LIPID PANEL
Chol/HDL Ratio: 2.4 ratio (ref 0.0–4.4)
Cholesterol, Total: 181 mg/dL (ref 100–199)
HDL: 74 mg/dL (ref >39)
LDL Calculated: 88 mg/dL (ref 0–99)
Triglycerides: 93 mg/dL (ref 0–149)
VLDL CHOLESTEROL CAL: 19 mg/dL (ref 5–40)

## 2018-11-18 ENCOUNTER — Ambulatory Visit (INDEPENDENT_AMBULATORY_CARE_PROVIDER_SITE_OTHER): Payer: Medicare Other | Admitting: Physician Assistant

## 2018-11-18 ENCOUNTER — Encounter: Payer: Self-pay | Admitting: Physician Assistant

## 2018-11-18 ENCOUNTER — Other Ambulatory Visit: Payer: Self-pay

## 2018-11-18 DIAGNOSIS — N3000 Acute cystitis without hematuria: Secondary | ICD-10-CM | POA: Diagnosis not present

## 2018-11-18 DIAGNOSIS — B9689 Other specified bacterial agents as the cause of diseases classified elsewhere: Secondary | ICD-10-CM

## 2018-11-18 MED ORDER — CIPROFLOXACIN HCL 250 MG PO TABS: 250.0000 mg | ORAL_TABLET | Freq: Two times a day (BID) | ORAL | 0 refills | Status: DC

## 2018-11-18 NOTE — Progress Notes (Signed)
       Telephone visit  Subjective: CC:uti PCP: Bennie Pierini, FNP LLV:Priscilla Daniels is a 78 y.o. female calls for telephone consult today. Patient provides verbal consent for consult held via phone.  Patient is identified with 2 separate identifiers.  At this time the entire area is on COVID-19 social distancing and stay home orders are in place.  Patient is of higher risk and therefore we are performing this by a virtual method.  Location of patient: home Location of provider: [facility] Others present for call: no  Patient reports that she has been having urinary tract symptoms for greater than 1 week.  She does feel pressure in her bladder.  She has had frequency.  She has had some pain across her lower abdomen.  She was supposed to see urology back in April but had to be canceled because of the COVID restrictions.  She denies any fever or chills at this time.   ROS: Per HPI  Allergies  Allergen Reactions  . Lipitor [Atorvastatin Calcium]   . Septra [Bactrim]   . Sulfa Antibiotics    Past Medical History:  Diagnosis Date  . Abnormal vaginal Pap smear   . Candidiasis    cuteneous   . Chronic UTI   . COPD (chronic obstructive pulmonary disease) (HCC) 2010   Dx by CXR   . DM (diabetes mellitus) (HCC)   . Fatigue   . Glaucoma   . Hyperlipidemia    Diet Controlled   . NIDDM (non-insulin dependent diabetes mellitus)   . Phlebitis   . Strep pharyngitis   . Vaginal atrophy   . Vertigo     Current Outpatient Medications:  .  dorzolamide-timolol (COSOPT) 22.3-6.8 MG/ML ophthalmic solution, , Disp: , Rfl:  .  lisinopril-hydrochlorothiazide (PRINZIDE,ZESTORETIC) 20-12.5 MG tablet, Take 1 tablet by mouth daily., Disp: 90 tablet, Rfl: 1 .  TRAVATAN Z 0.004 % SOLN ophthalmic solution, , Disp: , Rfl:  .  ciprofloxacin (CIPRO) 250 MG tablet, Take 1 tablet (250 mg total) by mouth 2 (two) times daily., Disp: 14 tablet, Rfl: 0  Assessment/ Plan: 78 y.o. female   1. Acute  cystitis without hematuria - ciprofloxacin (CIPRO) 250 MG tablet; Take 1 tablet (250 mg total) by mouth 2 (two) times daily.  Dispense: 14 tablet; Refill: 0   Start time: 10:15 AM End time: 10:21 AM  Meds ordered this encounter  Medications  . ciprofloxacin (CIPRO) 250 MG tablet    Sig: Take 1 tablet (250 mg total) by mouth 2 (two) times daily.    Dispense:  14 tablet    Refill:  0    Order Specific Question:   Supervising Provider    Answer:   Raliegh Ip [[id number]]    Prudy Feeler PA-C [facility] [phone]

## 2019-01-17 DIAGNOSIS — H43813 Vitreous degeneration, bilateral: Secondary | ICD-10-CM | POA: Diagnosis not present

## 2019-01-17 DIAGNOSIS — H43812 Vitreous degeneration, left eye: Secondary | ICD-10-CM | POA: Diagnosis not present

## 2019-01-17 DIAGNOSIS — H353132 Nonexudative age-related macular degeneration, bilateral, intermediate dry stage: Secondary | ICD-10-CM | POA: Diagnosis not present

## 2019-01-17 DIAGNOSIS — H348122 Central retinal vein occlusion, left eye, stable: Secondary | ICD-10-CM | POA: Diagnosis not present

## 2019-03-02 ENCOUNTER — Encounter: Payer: Self-pay | Admitting: Nurse Practitioner

## 2019-03-02 ENCOUNTER — Ambulatory Visit (INDEPENDENT_AMBULATORY_CARE_PROVIDER_SITE_OTHER): Payer: Medicare Other | Admitting: Nurse Practitioner

## 2019-03-02 DIAGNOSIS — I10 Essential (primary) hypertension: Secondary | ICD-10-CM

## 2019-03-02 DIAGNOSIS — I700 Atherosclerosis of aorta: Secondary | ICD-10-CM | POA: Diagnosis not present

## 2019-03-02 DIAGNOSIS — N183 Chronic kidney disease, stage 3 unspecified: Secondary | ICD-10-CM

## 2019-03-02 DIAGNOSIS — E785 Hyperlipidemia, unspecified: Secondary | ICD-10-CM

## 2019-03-02 DIAGNOSIS — E8881 Metabolic syndrome: Secondary | ICD-10-CM

## 2019-03-02 MED ORDER — LISINOPRIL-HYDROCHLOROTHIAZIDE 20-12.5 MG PO TABS: 1.0000 | ORAL_TABLET | Freq: Every day | ORAL | 1 refills | Status: DC

## 2019-03-02 NOTE — Progress Notes (Signed)
Virtual Visit via telephone Note Due to COVID-19 pandemic this visit was conducted virtually. This visit type was conducted due to national recommendations for restrictions regarding the COVID-19 Pandemic (e.g. social distancing, sheltering in place) in an effort to limit this patient's exposure and mitigate transmission in our community. All issues noted in this document were discussed and addressed.  A physical exam was not performed with this format.  I connected with Priscilla Daniels on 03/02/19 at 10:05 by telephone and verified that I am speaking with the correct person using two identifiers. Priscilla Daniels is currently located at home  and no one is currently with her during visit. The provider, Mary-Margaret Hassell Done, FNP is located in their office at time of visit.  I discussed the limitations, risks, security and privacy concerns of performing an evaluation and management service by telephone and the availability of in person appointments. I also discussed with the patient that there may be a patient responsible charge related to this service. The patient expressed understanding and agreed to proceed.   History and Present Illness:   Chief Complaint: Medical Management of Chronic Issues    HPI:  1. Essential hypertension No c/o chest pain, sob or headache. Does not check blood pressure at home. BP Readings from Last 3 Encounters:  08/29/18 127/78  08/15/18 140/82  07/08/18 136/78     2. Hyperlipidemia with target LDL less than 100 She does not really watch diet and does very little exercise. She refuses to take a statin. Lab Results  Component Value Date   CHOL 181 08/29/2018   HDL 74 08/29/2018   LDLCALC 88 08/29/2018   TRIG 93 08/29/2018   CHOLHDL 2.4 08/29/2018     3. Atherosclerosis of aorta ([place]) This was found on chest xray. She denies any problems that she is aware of.  4. CKD (chronic kidney disease), stage III ([place]) No problems voiding. Has slight edema  in bil ankles by end of day. Lab Results  Component Value Date   CREATININE 1.22 (H) 08/29/2018     5. Metabolic syndrome Does not check blood sugars at ome. She says sh eis not a big sweet eater. Lab Results  Component Value Date   HGBA1C 6.2 08/29/2018       Outpatient Encounter Medications as of 03/02/2019  Medication Sig  . ciprofloxacin (CIPRO) 250 MG tablet Take 1 tablet (250 mg total) by mouth 2 (two) times daily.  . dorzolamide-timolol (COSOPT) 22.3-6.8 MG/ML ophthalmic solution   . lisinopril-hydrochlorothiazide (PRINZIDE,ZESTORETIC) 20-12.5 MG tablet Take 1 tablet by mouth daily.  Dorette Grate Z 0.004 % SOLN ophthalmic solution    No facility-administered encounter medications on file as of 03/02/2019.     Past Surgical History:  Procedure Laterality Date  . CERVICAL CONE BIOPSY    . GLAUCOMA SURGERY  12/04 & 07/05/03  . left eye cataract surgery  01/29/2009  . right eye cataract surgery  01/13/09  . VARICOSE VEIN SURGERY     both legs     Family History  Problem Relation Age of Onset  . Stroke Mother   . Cancer Father   . Cancer Sister   . Diabetes Brother   . Hyperlipidemia Brother   . Hyperlipidemia Brother     New complaints: None today  Social history: Her daughter lives with her  Controlled substance contract: n/a    Review of Systems  Constitutional: Negative for diaphoresis and weight loss.  Eyes: Negative for blurred vision, double vision and  pain.  Respiratory: Negative for shortness of breath.   Cardiovascular: Negative for chest pain, palpitations, orthopnea and leg swelling.  Gastrointestinal: Negative for abdominal pain.  Skin: Negative for rash.  Neurological: Negative for dizziness, sensory change, loss of consciousness, weakness and headaches.  Endo/Heme/Allergies: Negative for polydipsia. Does not bruise/bleed easily.  Psychiatric/Behavioral: Negative for memory loss. The patient does not have insomnia.   All other systems  reviewed and are negative.    Observations/Objective: Alert and oriented- answers all questions appropriately No distress    Assessment and Plan: Priscilla Daniels comes in today with chief complaint of Medical Management of Chronic Issues   Diagnosis and orders addressed:  1. Essential hypertension Low sodium diet - lisinopril-hydrochlorothiazide (ZESTORETIC) 20-12.5 MG tablet; Take 1 tablet by mouth daily.  Dispense: 90 tablet; Refill: 1  2. Hyperlipidemia with target LDL less than 100 Low fat diet  3. Atherosclerosis of aorta (HCC)  4. CKD (chronic kidney disease), stage III (HCC) Will reechk labs at next office visit  5. Metabolic syndrome Watch carbs in diet   Previous labs reviewed Health Maintenance reviewed Diet and exercise encouraged  Follow up plan: 3 months      I discussed the assessment and treatment plan with the patient. The patient was provided an opportunity to ask questions and all were answered. The patient agreed with the plan and demonstrated an understanding of the instructions.   The patient was advised to call back or seek an in-person evaluation if the symptoms worsen or if the condition fails to improve as anticipated.  The above assessment and management plan was discussed with the patient. The patient verbalized understanding of and has agreed to the management plan. Patient is aware to call the clinic if symptoms persist or worsen. Patient is aware when to return to the clinic for a follow-up visit. Patient educated on when it is appropriate to go to the emergency department.   Time call ended:  10:17 I provided 12 minutes of non-face-to-face time during this encounter.    Mary-Margaret Daphine DeutscherMartin, FNP

## 2019-03-14 ENCOUNTER — Other Ambulatory Visit: Payer: Self-pay

## 2019-03-15 ENCOUNTER — Ambulatory Visit (INDEPENDENT_AMBULATORY_CARE_PROVIDER_SITE_OTHER): Payer: Medicare Other

## 2019-03-15 DIAGNOSIS — Z23 Encounter for immunization: Secondary | ICD-10-CM

## 2019-05-24 ENCOUNTER — Encounter: Payer: Self-pay | Admitting: Family Medicine

## 2019-05-24 ENCOUNTER — Ambulatory Visit (INDEPENDENT_AMBULATORY_CARE_PROVIDER_SITE_OTHER): Payer: Medicare Other | Admitting: Family Medicine

## 2019-05-24 ENCOUNTER — Other Ambulatory Visit: Payer: Self-pay

## 2019-05-24 DIAGNOSIS — R3989 Other symptoms and signs involving the genitourinary system: Secondary | ICD-10-CM | POA: Diagnosis not present

## 2019-05-24 MED ORDER — CEPHALEXIN 500 MG PO CAPS: 500.0000 mg | ORAL_CAPSULE | Freq: Two times a day (BID) | ORAL | 0 refills | Status: AC

## 2019-05-24 NOTE — Patient Instructions (Signed)
Urinary Tract Infection, Adult A urinary tract infection (UTI) is an infection of any part of the urinary tract. The urinary tract includes:  The kidneys.  The ureters.  The bladder.  The urethra. These organs make, store, and get rid of pee (urine) in the body. What are the causes? This is caused by germs (bacteria) in your genital area. These germs grow and cause swelling (inflammation) of your urinary tract. What increases the risk? You are more likely to develop this condition if:  You have a small, thin tube (catheter) to drain pee.  You cannot control when you pee or poop (incontinence).  You are female, and: ? You use these methods to prevent pregnancy: ? A medicine that kills sperm (spermicide). ? A device that blocks sperm (diaphragm). ? You have low levels of a female hormone (estrogen). ? You are pregnant.  You have genes that add to your risk.  You are sexually active.  You take antibiotic medicines.  You have trouble peeing because of: ? A prostate that is bigger than normal, if you are female. ? A blockage in the part of your body that drains pee from the bladder (urethra). ? A kidney stone. ? A nerve condition that affects your bladder (neurogenic bladder). ? Not getting enough to drink. ? Not peeing often enough.  You have other conditions, such as: ? Diabetes. ? A weak disease-fighting system (immune system). ? Sickle cell disease. ? Gout. ? Injury of the spine. What are the signs or symptoms? Symptoms of this condition include:  Needing to pee right away (urgently).  Peeing often.  Peeing small amounts often.  Pain or burning when peeing.  Blood in the pee.  Pee that smells bad or not like normal.  Trouble peeing.  Pee that is cloudy.  Fluid coming from the vagina, if you are female.  Pain in the belly or lower back. Other symptoms include:  Throwing up (vomiting).  No urge to eat.  Feeling mixed up (confused).  Being tired  and grouchy (irritable).  A fever.  Watery poop (diarrhea). How is this treated? This condition may be treated with:  Antibiotic medicine.  Other medicines.  Drinking enough water. Follow these instructions at home:  Medicines  Take over-the-counter and prescription medicines only as told by your doctor.  If you were prescribed an antibiotic medicine, take it as told by your doctor. Do not stop taking it even if you start to feel better. General instructions  Make sure you: ? Pee until your bladder is empty. ? Do not hold pee for a long time. ? Empty your bladder after sex. ? Wipe from front to back after pooping if you are a female. Use each tissue one time when you wipe.  Drink enough fluid to keep your pee pale yellow.  Keep all follow-up visits as told by your doctor. This is important. Contact a doctor if:  You do not get better after 1-2 days.  Your symptoms go away and then come back. Get help right away if:  You have very bad back pain.  You have very bad pain in your lower belly.  You have a fever.  You are sick to your stomach (nauseous).  You are throwing up. Summary  A urinary tract infection (UTI) is an infection of any part of the urinary tract.  This condition is caused by germs in your genital area.  There are many risk factors for a UTI. These include having a small, thin   tube to drain pee and not being able to control when you pee or poop.  Treatment includes antibiotic medicines for germs.  Drink enough fluid to keep your pee pale yellow. This information is not intended to replace advice given to you by your health care provider. Make sure you discuss any questions you have with your health care provider. Document Released: 11/25/2007 Document Revised: 05/26/2018 Document Reviewed: 12/16/2017 Elsevier Patient Education  2020 [employer]  

## 2019-05-24 NOTE — Progress Notes (Signed)
   Virtual Visit via Telephone Note  I connected with Priscilla Daniels on 05/24/19 at 1:32 PM by telephone and verified that I am speaking with the correct person using two identifiers. Priscilla Daniels is currently located at home and nobody is currently with her during this visit. The provider, Loman Brooklyn, FNP is located in their office at time of visit.  I discussed the limitations, risks, security and privacy concerns of performing an evaluation and management service by telephone and the availability of in person appointments. I also discussed with the patient that there may be a patient responsible charge related to this service. The patient expressed understanding and agreed to proceed.  Subjective: PCP: Chevis Pretty, FNP  Chief Complaint  Patient presents with  . Urinary Tract Infection   Urinary Tract Infection: Patient complains of abnormal smelling urine and bilateral flank pain She has had symptoms for 1 week. Patient also complains of stomach ache. Patient denies fever. Patient does have a history of recurrent UTI.  Patient does not have a history of pyelonephritis.    ROS: Per HPI  Current Outpatient Medications:  .  dorzolamide-timolol (COSOPT) 22.3-6.8 MG/ML ophthalmic solution, , Disp: , Rfl:  .  lisinopril-hydrochlorothiazide (ZESTORETIC) 20-12.5 MG tablet, Take 1 tablet by mouth daily., Disp: 90 tablet, Rfl: 1 .  TRAVATAN Z 0.004 % SOLN ophthalmic solution, , Disp: , Rfl:   Allergies  Allergen Reactions  . Lipitor [Atorvastatin Calcium]   . Septra [Bactrim]   . Sulfa Antibiotics    Past Medical History:  Diagnosis Date  . Abnormal vaginal Pap smear   . Candidiasis    cuteneous   . Chronic UTI   . COPD (chronic obstructive pulmonary disease) ([place]) 2010   Dx by CXR   . DM (diabetes mellitus) ([place])   . Fatigue   . Glaucoma   . Hyperlipidemia    Diet Controlled   . NIDDM (non-insulin dependent diabetes mellitus)   . Phlebitis   . Strep  pharyngitis   . Vaginal atrophy   . Vertigo     Observations/Objective: A&O  No respiratory distress or wheezing audible over the phone Mood, judgement, and thought processes all WNL  Assessment and Plan: 1. Suspected UTI - Education provided on UTI. Treating empirically as patient reports this is how her UTIs always present. - cephALEXin (KEFLEX) 500 MG capsule; Take 1 capsule (500 mg total) by mouth 2 (two) times daily for 7 days.  Dispense: 14 capsule; Refill: 0   Follow Up Instructions:  I discussed the assessment and treatment plan with the patient. The patient was provided an opportunity to ask questions and all were answered. The patient agreed with the plan and demonstrated an understanding of the instructions.   The patient was advised to call back or seek an in-person evaluation if the symptoms worsen or if the condition fails to improve as anticipated.  The above assessment and management plan was discussed with the patient. The patient verbalized understanding of and has agreed to the management plan. Patient is aware to call the clinic if symptoms persist or worsen. Patient is aware when to return to the clinic for a follow-up visit. Patient educated on when it is appropriate to go to the emergency department.   Time call ended: 1:36 PM  I provided 7 minutes of non-face-to-face time during this encounter.  Hendricks Limes, MSN, APRN, FNP-C [facility] 05/24/19

## 2019-05-31 ENCOUNTER — Other Ambulatory Visit: Payer: Self-pay

## 2019-06-01 ENCOUNTER — Encounter: Payer: Self-pay | Admitting: Nurse Practitioner

## 2019-06-01 ENCOUNTER — Ambulatory Visit (INDEPENDENT_AMBULATORY_CARE_PROVIDER_SITE_OTHER): Payer: Medicare Other | Admitting: Nurse Practitioner

## 2019-06-01 VITALS — BP 122/84 | HR 70 | Temp 97.1°F | Resp 20 | Ht 67.0 in | Wt 162.0 lb

## 2019-06-01 DIAGNOSIS — R3989 Other symptoms and signs involving the genitourinary system: Secondary | ICD-10-CM

## 2019-06-01 DIAGNOSIS — I700 Atherosclerosis of aorta: Secondary | ICD-10-CM

## 2019-06-01 DIAGNOSIS — E8881 Metabolic syndrome: Secondary | ICD-10-CM | POA: Diagnosis not present

## 2019-06-01 DIAGNOSIS — E785 Hyperlipidemia, unspecified: Secondary | ICD-10-CM | POA: Diagnosis not present

## 2019-06-01 DIAGNOSIS — I10 Essential (primary) hypertension: Secondary | ICD-10-CM | POA: Diagnosis not present

## 2019-06-01 DIAGNOSIS — N1831 Chronic kidney disease, stage 3a: Secondary | ICD-10-CM

## 2019-06-01 LAB — MICROSCOPIC EXAMINATION
Bacteria, UA: NONE SEEN
RBC, Urine: NONE SEEN /hpf (ref 0–2)
Renal Epithel, UA: NONE SEEN /hpf

## 2019-06-01 LAB — URINALYSIS, COMPLETE
Bilirubin, UA: NEGATIVE
Glucose, UA: NEGATIVE
Ketones, UA: NEGATIVE
Leukocytes,UA: NEGATIVE
Nitrite, UA: NEGATIVE
Protein,UA: NEGATIVE
RBC, UA: NEGATIVE
Specific Gravity, UA: 1.025 (ref 1.005–1.030)
Urobilinogen, Ur: 0.2 mg/dL (ref 0.2–1.0)
pH, UA: 6 (ref 5.0–7.5)

## 2019-06-01 MED ORDER — LISINOPRIL-HYDROCHLOROTHIAZIDE 20-12.5 MG PO TABS: 1.0000 | ORAL_TABLET | Freq: Every day | ORAL | 1 refills | Status: DC

## 2019-06-01 NOTE — Addendum Note (Signed)
Addended by: Chevis Pretty on: 06/01/2019 11:37 AM   Modules accepted: Orders

## 2019-06-01 NOTE — Patient Instructions (Signed)

## 2019-06-01 NOTE — Progress Notes (Signed)
Subjective:    Patient ID: Priscilla Daniels, female    DOB: October 04, 1940, 78 y.o.   MRN: [id number]   Chief Complaint: Medical Management of Chronic Issues (Recent UTI trreated with Keflex but no better)    HPI:  1. Suspected UTI Patient had televisit last week and was dx with UTI. Was treated with keflex and is still having some dysuria.  2. Essential hypertension No c/o chest pain, sob or headache. Does not check blood pressure at home. BP Readings from Last 3 Encounters:  06/01/19 122/84  08/29/18 127/78  08/15/18 140/82     3. Hyperlipidemia with target LDL less than 100 Tries to avoid fried foods. Does very little exercise. Lab Results  Component Value Date   CHOL 181 08/29/2018   HDL 74 08/29/2018   LDLCALC 88 08/29/2018   TRIG 93 08/29/2018   CHOLHDL 2.4 08/29/2018     4. Metabolic syndrome Does not check blood sugars at home. Daniels snot eat a lot of sweets. Lab Results  Component Value Date   HGBA1C 6.2 08/29/2018     5. Atherosclerosis of aorta (HCC) Seen on chest x ray- denies any chest pain or sob  6. Stage 3a chronic kidney disease No problems voiding Lab Results  Component Value Date   CREATININE 1.22 (H) 08/29/2018       Outpatient Encounter Medications as of 06/01/2019  Medication Sig  . dorzolamide-timolol (COSOPT) 22.3-6.8 MG/ML ophthalmic solution   . lisinopril-hydrochlorothiazide (ZESTORETIC) 20-12.5 MG tablet Take 1 tablet by mouth daily.  . TRAVATAN Z 0.004 % SOLN ophthalmic solution      Past Surgical History:  Procedure Laterality Date  . CERVICAL CONE BIOPSY    . GLAUCOMA SURGERY  12/04 & 07/05/03  . left eye cataract surgery  01/29/2009  . right eye cataract surgery  01/13/09  . VARICOSE VEIN SURGERY     both legs     Family History  Problem Relation Age of Onset  . Stroke Mother   . Cancer Father   . Cancer Sister   . Diabetes Brother   . Hyperlipidemia Brother   . Hyperlipidemia Brother     New complaints: none   Social history: Her daughter lives with her  Controlled substance contract: n/a    Review of Systems  Constitutional: Negative for diaphoresis.  Eyes: Negative for pain.  Respiratory: Negative for shortness of breath.   Cardiovascular: Negative for chest pain, palpitations and leg swelling.  Gastrointestinal: Negative for abdominal pain.  Endocrine: Negative for polydipsia.  Skin: Negative for rash.  Neurological: Negative for dizziness, weakness and headaches.  Hematological: Does not bruise/bleed easily.  All other systems reviewed and are negative.      Objective:   Physical Exam Vitals and nursing note reviewed.  Constitutional:      General: She is not in acute distress.    Appearance: Normal appearance. She is well-developed.  HENT:     Head: Normocephalic.     Nose: Nose normal.  Eyes:     Pupils: Pupils are equal, round, and reactive to light.  Neck:     Vascular: No carotid bruit or JVD.  Cardiovascular:     Rate and Rhythm: Normal rate and regular rhythm.     Heart sounds: Normal heart sounds.  Pulmonary:     Effort: Pulmonary effort is normal. No respiratory distress.     Breath sounds: Normal breath sounds. No wheezing or rales.  Chest:     Chest wall: No tenderness.  Abdominal:     General: Bowel sounds are normal. There is no distension or abdominal bruit.     Palpations: Abdomen is soft. There is no hepatomegaly, splenomegaly, mass or pulsatile mass.     Tenderness: There is no abdominal tenderness.  Musculoskeletal:        General: Normal range of motion.     Cervical back: Normal range of motion and neck supple.  Lymphadenopathy:     Cervical: No cervical adenopathy.  Skin:    General: Skin is warm and dry.  Neurological:     Mental Status: She is alert and oriented to person, place, and time.     Deep Tendon Reflexes: Reflexes are normal and symmetric.  Psychiatric:        Behavior: Behavior normal.        Thought Content: Thought content  normal.        Judgment: Judgment normal.    BP 122/84   Pulse 70   Temp (!) 97.1 F (36.2 C) (Temporal)   Resp 20   Ht 5\' 7"  (1.702 m)   Wt 162 lb (73.5 kg)   SpO2 96%   BMI 25.37 kg/m         Assessment & Plan:  Priscilla Daniels comes in today with chief complaint of Medical Management of Chronic Issues (Recent UTI trreated with Keflex but no better)   Diagnosis and orders addressed:  1. Suspected UTI Take medication as prescribe Priscilla Daniels underwear Take shower not bath Cranberry juice, yogurt Force fluids Priscilla Daniels over the counter X2 days Culture pending RTO prn  - urinalysis- dip and micro  2. Essential hypertension Low sodium diet - lisinopril-hydrochlorothiazide (ZESTORETIC) 20-12.5 MG tablet; Take 1 tablet by mouth daily.  Dispense: 90 tablet; Refill: 1  3. Hyperlipidemia with target LDL less than 100 Low fat diet  4. Metabolic syndrome Watch carbs in diet  5. Atherosclerosis of aorta (HCC) Will continue to watch  6. Stage 3a chronic kidney disease Labs pending   Labs pending Health Maintenance reviewed Diet and exercise encouraged  Follow up plan: 6 months   Priscilla [name], FNP

## 2019-06-02 LAB — CMP14+EGFR
ALT: 16 IU/L (ref 0–32)
AST: 20 IU/L (ref 0–40)
Albumin/Globulin Ratio: 1.6 (ref 1.2–2.2)
Albumin: 4.4 g/dL (ref 3.7–4.7)
Alkaline Phosphatase: 68 IU/L (ref 39–117)
BUN/Creatinine Ratio: 20 (ref 12–28)
BUN: 21 mg/dL (ref 8–27)
Bilirubin Total: 0.3 mg/dL (ref 0.0–1.2)
CO2: 26 mmol/L (ref 20–29)
Calcium: 9.8 mg/dL (ref 8.7–10.3)
Chloride: 101 mmol/L (ref 96–106)
Creatinine, Ser: 1.07 mg/dL — ABNORMAL HIGH (ref 0.57–1.00)
GFR calc Af Amer: 57 mL/min/{1.73_m2} — ABNORMAL LOW (ref >59)
GFR calc non Af Amer: 50 mL/min/{1.73_m2} — ABNORMAL LOW (ref >59)
Globulin, Total: 2.7 g/dL (ref 1.5–4.5)
Glucose: 100 mg/dL — ABNORMAL HIGH (ref 65–99)
Potassium: 4.2 mmol/L (ref 3.5–5.2)
Sodium: 139 mmol/L (ref 134–144)
Total Protein: 7.1 g/dL (ref 6.0–8.5)

## 2019-06-02 LAB — LIPID PANEL
Chol/HDL Ratio: 2.1 ratio (ref 0.0–4.4)
Cholesterol, Total: 204 mg/dL — ABNORMAL HIGH (ref 100–199)
HDL: 98 mg/dL (ref >39)
LDL Chol Calc (NIH): 91 mg/dL (ref 0–99)
Triglycerides: 85 mg/dL (ref 0–149)
VLDL Cholesterol Cal: 15 mg/dL (ref 5–40)

## 2019-08-14 ENCOUNTER — Ambulatory Visit (INDEPENDENT_AMBULATORY_CARE_PROVIDER_SITE_OTHER): Payer: Medicare Other

## 2019-08-14 ENCOUNTER — Other Ambulatory Visit: Payer: Self-pay

## 2019-08-14 ENCOUNTER — Encounter: Payer: Self-pay | Admitting: Family

## 2019-08-14 ENCOUNTER — Ambulatory Visit (INDEPENDENT_AMBULATORY_CARE_PROVIDER_SITE_OTHER): Payer: Medicare Other | Admitting: Family

## 2019-08-14 VITALS — BP 140/77 | HR 85 | Temp 98.4°F | Ht 67.0 in | Wt 160.2 lb

## 2019-08-14 DIAGNOSIS — Y92009 Unspecified place in unspecified non-institutional (private) residence as the place of occurrence of the external cause: Secondary | ICD-10-CM

## 2019-08-14 DIAGNOSIS — W19XXXA Unspecified fall, initial encounter: Secondary | ICD-10-CM | POA: Diagnosis not present

## 2019-08-14 DIAGNOSIS — M542 Cervicalgia: Secondary | ICD-10-CM | POA: Diagnosis not present

## 2019-08-14 DIAGNOSIS — S139XXA Sprain of joints and ligaments of unspecified parts of neck, initial encounter: Secondary | ICD-10-CM | POA: Diagnosis not present

## 2019-08-14 IMAGING — DX DG CERVICAL SPINE COMPLETE 4+V
6 series · 6 of 6 positions shown · non-contrast
Comparison: None

CLINICAL DATA: Neck pain after falling 10 days ago at home, initial
encounter

EXAM:
CERVICAL SPINE - COMPLETE 4+ VIEW

[c-spine lat]
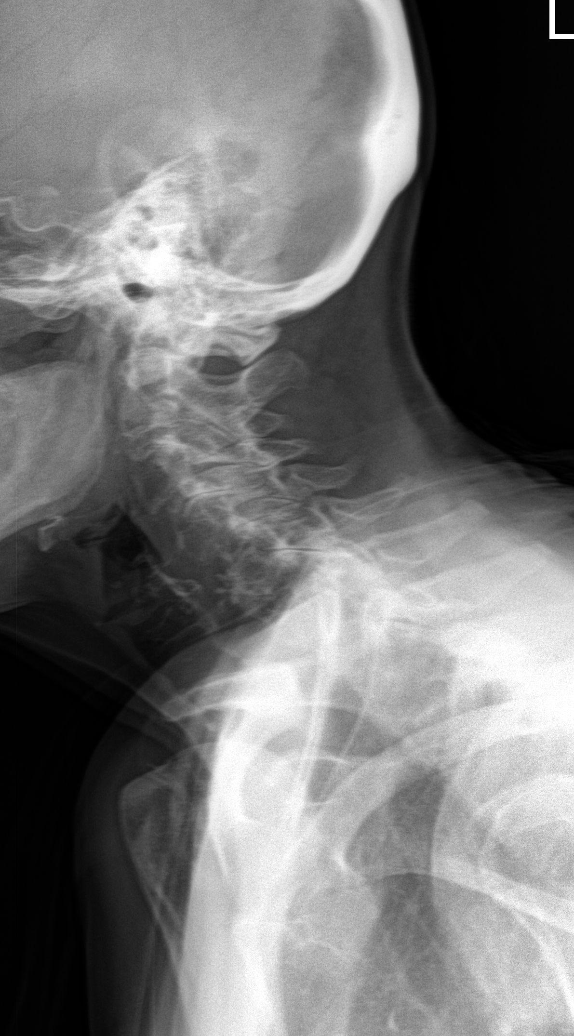

[c-spine obl (1 of 2)]
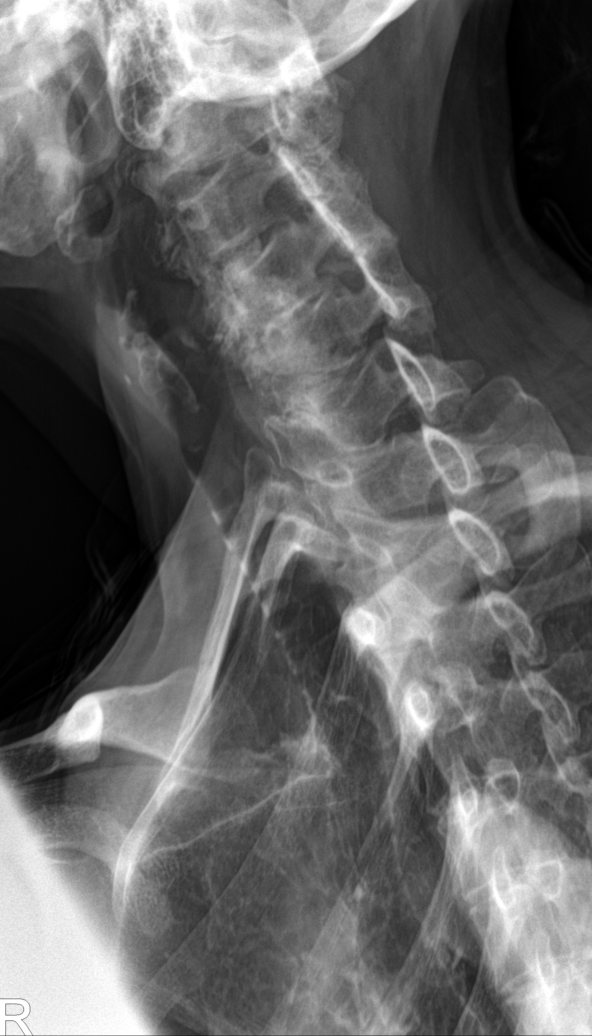

[c-spine obl (2 of 2)]
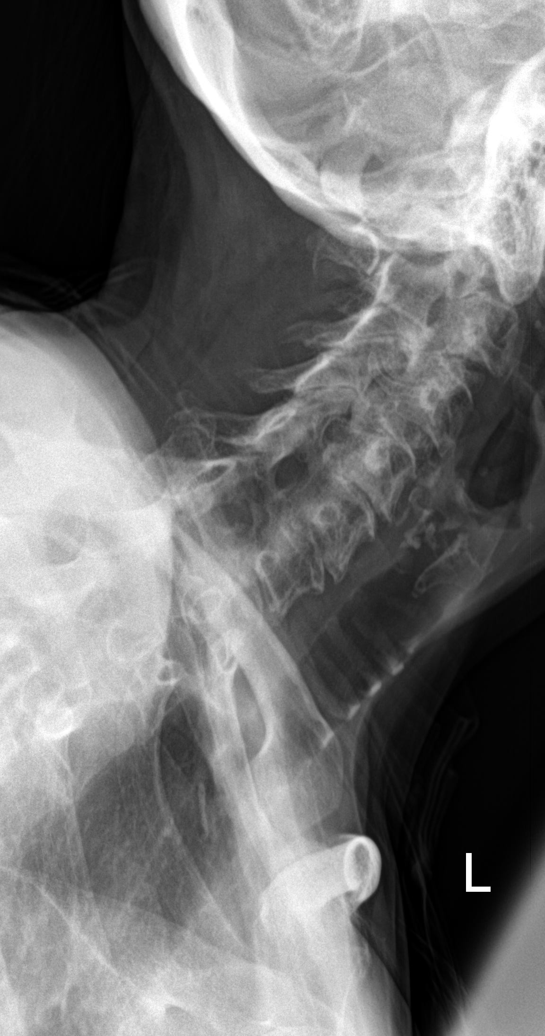

[c-spine ap]
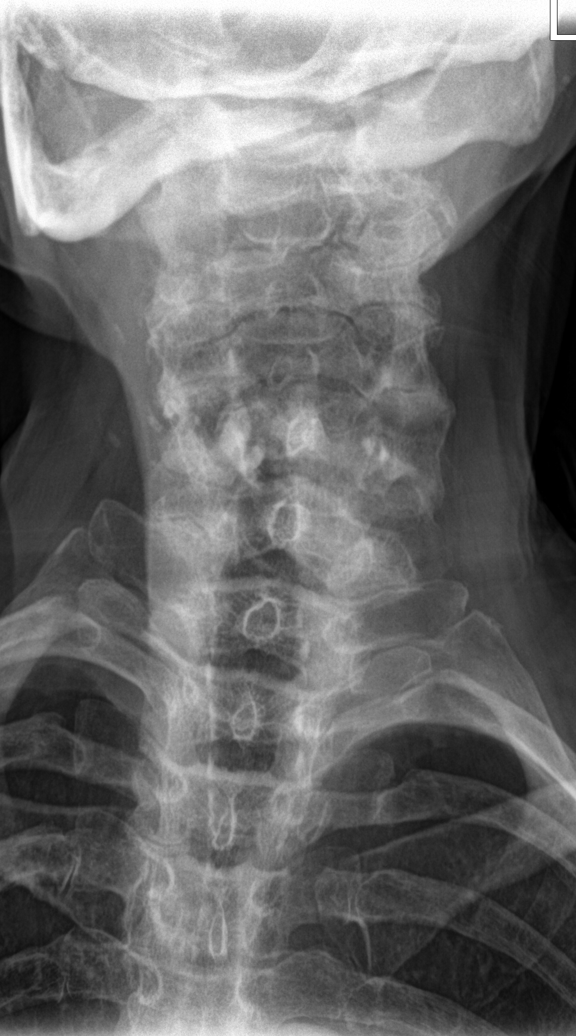

[c-spine open mouth]
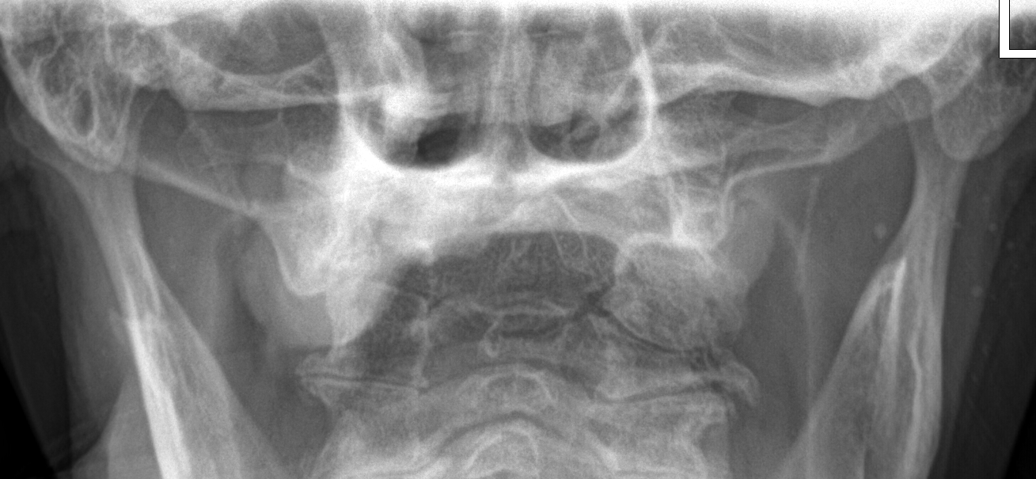

[t-spine lat swimmers]
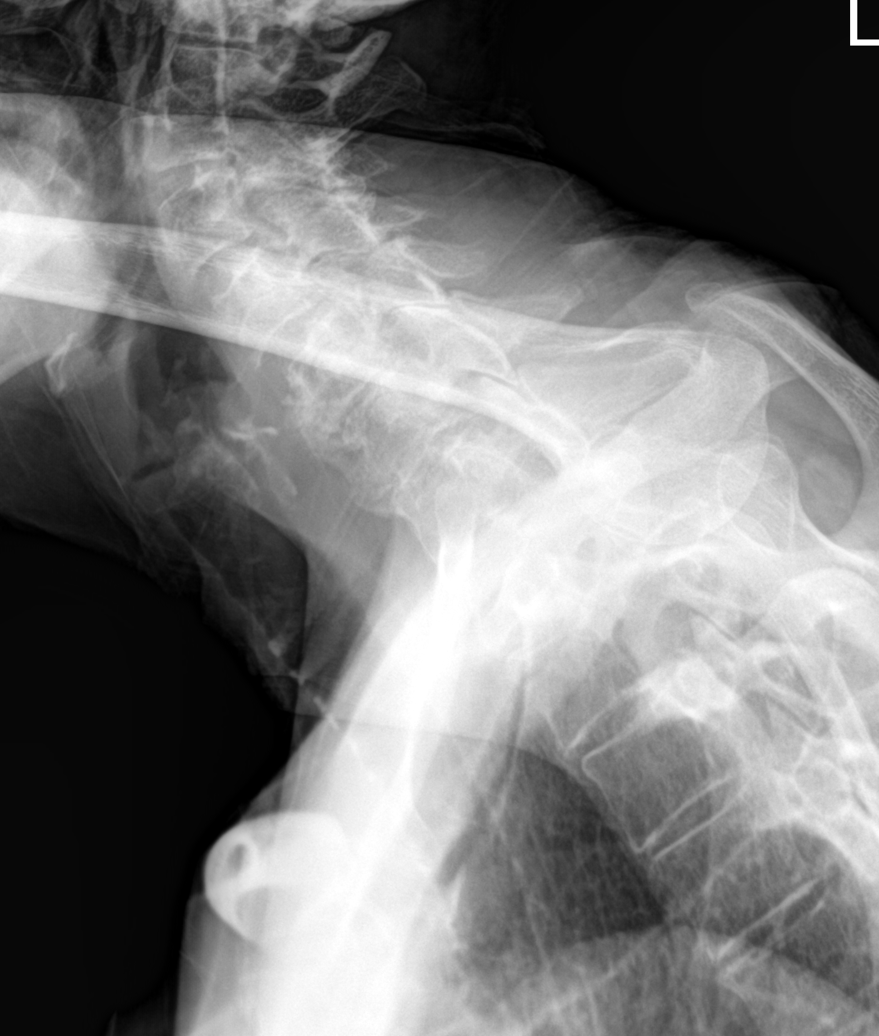

[6 of 6 positions shown; findings below may reference images not displayed]

FINDINGS: Motion artifacts degrade lateral view.

Osseous demineralization.

Multilevel degenerative disc and facet disease changes throughout
cervical spine with disc space narrowing and endplate spur
formation.

Vertebral body heights maintained without fracture or subluxation.

No bone destruction.

Encroachment upon cervical foramina bilaterally by uncovertebral
spurring especially at RIGHT C3-C4.
IMPRESSION: Multilevel degenerative disc and facet disease changes of the
cervical spine.

No acute abnormalities.

## 2019-08-14 MED ORDER — BACLOFEN 5 MG PO TABS: 5.0000 mg | ORAL_TABLET | Freq: Two times a day (BID) | ORAL | 2 refills | Status: DC | PRN

## 2019-08-14 MED ORDER — DICLOFENAC SODIUM 75 MG PO TBEC: 75.0000 mg | DELAYED_RELEASE_TABLET | Freq: Two times a day (BID) | ORAL | 0 refills | Status: DC

## 2019-08-14 NOTE — Progress Notes (Signed)
Subjective:    Patient ID: Priscilla Daniels, female    DOB: 09-03-1940, 79 y.o.   MRN: [id number]  Chief Complaint  Patient presents with  . Fall    HEAD INJURY, LEFT HIP PAIN. dID NOT GET CHECKED OUT . Happened 10 days ago at home in living room   PT presents to the office today for head and neck pain that started after she fell 10 days ago. She reports she was walking in her home and turned and fell and hit her head on the door frame.  She reports she has intermittent aching pain of 3-10 out 10 and it depends on the day. She reports her pain today is a 3 out 10. She has taken Motrin this morning.   Fall The accident occurred more than 1 week ago. There was no blood loss. The point of impact was the head and left hip (neck). The pain is at a severity of 8/10. The pain is mild. Pertinent negatives include no abdominal pain, bowel incontinence, fever, headaches, hearing loss, hematuria, loss of consciousness, nausea, numbness, tingling, visual change or vomiting.      Review of Systems  Constitutional: Negative for fever.  Gastrointestinal: Negative for abdominal pain, bowel incontinence, nausea and vomiting.  Genitourinary: Negative for hematuria.  Neurological: Negative for tingling, loss of consciousness, numbness and headaches.  All other systems reviewed and are negative.      Objective:   Physical Exam Vitals reviewed.  Constitutional:      General: She is not in acute distress.    Appearance: She is well-developed.  Eyes:     Pupils: Pupils are equal, round, and reactive to light.  Neck:     Thyroid: No thyromegaly.  Cardiovascular:     Rate and Rhythm: Normal rate and regular rhythm.     Heart sounds: Normal heart sounds. No murmur.  Pulmonary:     Effort: Pulmonary effort is normal. No respiratory distress.     Breath sounds: Normal breath sounds. No wheezing.  Abdominal:     General: Bowel sounds are normal. There is no distension.     Palpations: Abdomen is  soft.     Tenderness: There is no abdominal tenderness.  Musculoskeletal:        General: No tenderness.     Cervical back: Normal range of motion and neck supple.     Comments: Pain in posterior neck with flexion, extension, and rotation.   Skin:    General: Skin is warm and dry.  Neurological:     Mental Status: She is alert and oriented to person, place, and time.     Cranial Nerves: No cranial nerve deficit.     Deep Tendon Reflexes: Reflexes are normal and symmetric.  Psychiatric:        Behavior: Behavior normal.        Thought Content: Thought content normal.        Judgment: Judgment normal.      BP 140/77   Pulse 85   Temp 98.4 F (36.9 C) (Temporal)   Ht 5\' 7"  (1.702 m)   Wt 160 lb 3.2 oz (72.7 kg)   SpO2 99%   BMI 25.09 kg/m       Assessment & Plan:  1. Neck sprain, initial encounter - Baclofen 5 MG TABS; Take 5 mg by mouth 2 (two) times daily as needed.  Dispense: 60 tablet; Refill: 2 - diclofenac (VOLTAREN) 75 MG EC tablet; Take 1 tablet (75 mg total)  by mouth 2 (two) times daily.  Dispense: 30 tablet; Refill: 0 - DG Cervical Spine Complete; Future  2. Neck pain - Baclofen 5 MG TABS; Take 5 mg by mouth 2 (two) times daily as needed.  Dispense: 60 tablet; Refill: 2 - diclofenac (VOLTAREN) 75 MG EC tablet; Take 1 tablet (75 mg total) by mouth 2 (two) times daily.  Dispense: 30 tablet; Refill: 0 - DG Cervical Spine Complete; Future  3. Fall in home, initial encounter - DG Cervical Spine Complete; Future   Rest Ice  ROM exercises Sedation precautions discussed No other NSAID's while taking diclofenac  RTO if symptoms worsen or do not improve   Jannifer Rodney, FNP

## 2019-08-14 NOTE — Patient Instructions (Signed)

## 2019-08-15 ENCOUNTER — Telehealth: Payer: Self-pay | Admitting: Nurse Practitioner

## 2019-08-15 NOTE — Telephone Encounter (Signed)
Pt informed of results. No concerns at this time. 

## 2019-09-08 ENCOUNTER — Other Ambulatory Visit: Payer: Self-pay | Admitting: Nurse Practitioner

## 2019-09-08 DIAGNOSIS — M542 Cervicalgia: Secondary | ICD-10-CM

## 2019-09-08 DIAGNOSIS — S139XXA Sprain of joints and ligaments of unspecified parts of neck, initial encounter: Secondary | ICD-10-CM

## 2019-09-08 MED ORDER — DICLOFENAC SODIUM 75 MG PO TBEC: 75.0000 mg | DELAYED_RELEASE_TABLET | Freq: Two times a day (BID) | ORAL | 0 refills | Status: DC

## 2019-09-08 NOTE — Telephone Encounter (Signed)
Diclofenac sent to pharmacy

## 2019-09-08 NOTE — Telephone Encounter (Signed)
°  Medication Request  09/08/2019  What is the name of the medication? Diclofenac 75 mg. Patient is out  Have you contacted your pharmacy to request a refill? NO  Which [facility] [place]   Patient notified that their request is being sent to the clinical staff for review and that they should receive a call once it is complete. If they do not receive a call within 24 hours they can check with their pharmacy or our office.

## 2019-09-11 NOTE — Telephone Encounter (Signed)
Patient aware.

## 2019-10-02 ENCOUNTER — Ambulatory Visit (INDEPENDENT_AMBULATORY_CARE_PROVIDER_SITE_OTHER): Payer: Medicare Other | Admitting: *Deleted

## 2019-10-02 VITALS — Ht 67.0 in | Wt 160.3 lb

## 2019-10-02 DIAGNOSIS — Z0000 Encounter for general adult medical examination without abnormal findings: Secondary | ICD-10-CM

## 2019-10-02 NOTE — Progress Notes (Signed)
MEDICARE ANNUAL WELLNESS VISIT  10/02/2019  Telephone Visit Disclaimer This Medicare AWV was conducted by telephone due to national recommendations for restrictions regarding the COVID-19 Pandemic (e.g. social distancing).  I verified, using two identifiers, that I am speaking with Priscilla Daniels or their authorized healthcare agent. I discussed the limitations, risks, security, and privacy concerns of performing an evaluation and management service by telephone and the potential availability of an in-person appointment in the future. The patient expressed understanding and agreed to proceed.   Subjective:  Priscilla Daniels is a 79 y.o. female patient of Chevis Pretty, [place] who had a Medicare Annual Wellness Visit today via telephone. Priscilla Daniels is Retired and lives with their daughter. she has 2 children. she reports that she is socially active and does interact with friends/family regularly. she is minimally physically active and enjoys sewing.  Patient Care Team: Chevis Pretty, FNP as PCP - General (Nurse Practitioner)  Advanced Directives 10/02/2019 08/15/2018 02/08/2015  Does Patient Have a Medical Advance Directive? Yes Yes No  Type of Advance Directive Living will;Healthcare Power of Attorney Living will -  Does patient want to make changes to medical advance directive? No - Patient declined No - Patient declined -  Copy of [place] in Chart? No - copy requested - -  Would patient like information on creating a medical advance directive? - - Yes - Educational materials given    Hospital Utilization Over the Past 12 Months: # of hospitalizations or ER visits: 0 # of surgeries: 1  Review of Systems    Patient reports that her overall health is unchanged compared to last year.  History obtained from chart review and the patient General ROS: negative  Patient Reported Readings (BP, Pulse, CBG, Weight, etc) none  Pain Assessment Pain :  No/denies pain     Current Medications & Allergies (verified) Allergies as of 10/02/2019      Reactions   Lipitor [atorvastatin Calcium]    Septra [bactrim]    Sulfa Antibiotics       Medication List       Accurate as of October 02, 2019 10:12 AM. If you have any questions, ask your nurse or doctor.        Baclofen 5 MG Tabs Take 5 mg by mouth 2 (two) times daily as needed.   diclofenac 75 MG EC tablet Commonly known as: VOLTAREN Take 1 tablet (75 mg total) by mouth 2 (two) times daily.   dorzolamide-timolol 22.3-6.8 MG/ML ophthalmic solution Commonly known as: COSOPT   lisinopril-hydrochlorothiazide 20-12.5 MG tablet Commonly known as: ZESTORETIC Take 1 tablet by mouth daily.   Travatan Z 0.004 % Soln ophthalmic solution Generic drug: Travoprost (BAK Free)       History (reviewed): Past Medical History:  Diagnosis Date  . Abnormal vaginal Pap smear   . Candidiasis    cuteneous   . Chronic UTI   . COPD (chronic obstructive pulmonary disease) ([place]) 2010   Dx by CXR   . DM (diabetes mellitus) ([place])   . Fatigue   . Glaucoma   . Hyperlipidemia    Diet Controlled   . NIDDM (non-insulin dependent diabetes mellitus)   . Phlebitis   . Strep pharyngitis   . Vaginal atrophy   . Vertigo    Past Surgical History:  Procedure Laterality Date  . CERVICAL CONE BIOPSY    . GLAUCOMA SURGERY  12/04 & 07/05/03  . left eye cataract surgery  01/29/2009  .  right eye cataract surgery  01/13/09  . VARICOSE VEIN SURGERY     both legs    Family History  Problem Relation Age of Onset  . Stroke Mother   . Cancer Father   . Cancer Sister   . Diabetes Brother   . Hyperlipidemia Brother   . Hyperlipidemia Brother    Social History   Socioeconomic History  . Marital status: Widowed    Spouse name: Not on file  . Number of children: 2  . Years of education: 7  . Highest education level: 7th grade  Occupational History  . Occupation: Retired  Tobacco Use  . Smoking  status: Never Smoker  . Smokeless tobacco: Never Used  Substance and Sexual Activity  . Alcohol use: No  . Drug use: No  . Sexual activity: Not Currently  Other Topics Concern  . Not on file  Social History Narrative  . Not on file   Social Determinants of Health   Financial Resource Strain:   . Difficulty of Paying Living Expenses:   Food Insecurity:   . Worried About Programme researcher, broadcasting/film/video in the Last Year:   . Barista in the Last Year:   Transportation Needs:   . Freight forwarder (Medical):   Marland Kitchen Lack of Transportation (Non-Medical):   Physical Activity:   . Days of Exercise per Week:   . Minutes of Exercise per Session:   Stress:   . Feeling of Stress :   Social Connections:   . Frequency of Communication with Friends and Family:   . Frequency of Social Gatherings with Friends and Family:   . Attends Religious Services:   . Active Member of Clubs or Organizations:   . Attends Banker Meetings:   Marland Kitchen Marital Status:     Activities of Daily Living In your present state of health, do you have any difficulty performing the following activities: 10/02/2019  Hearing? Y  Vision? N  Difficulty concentrating or making decisions? Y  Walking or climbing stairs? Y  Dressing or bathing? N  Doing errands, shopping? N  Preparing Food and eating ? N  Using the Toilet? N  In the past six months, have you accidently leaked urine? N  Do you have problems with loss of bowel control? N  Managing your Medications? N  Managing your Finances? N  Housekeeping or managing your Housekeeping? N  Some recent data might be hidden    Patient Education/ Literacy How often do you need to have someone help you when you read instructions, pamphlets, or other written materials from your doctor or pharmacy?: 1 - Never What is the last grade level you completed in school?: 7th Grade  Exercise Current Exercise Habits: The patient does not participate in regular exercise at  present, Exercise limited by: None identified  Diet Patient reports consuming 2 meals a day and 1 snack(s) a day Patient reports that her primary diet is: Regular Patient reports that she does have regular access to food.   Depression Screen PHQ 2/9 Scores 10/02/2019 06/01/2019 03/02/2019 08/29/2018 08/15/2018 07/08/2018 06/27/2018  PHQ - 2 Score 0 0 0 0 0 0 0     Fall Risk Fall Risk  10/02/2019 08/14/2019 06/01/2019 03/02/2019 08/29/2018  Falls in the past year? 1 1 0 0 0  Number falls in past yr: 0 1 - - -  Injury with Fall? 1 1 - - -  Risk for fall due to : History of fall(s);Impaired mobility - - - -  Follow up Falls evaluation completed - - - -     Objective:  Priscilla Daniels seemed alert and oriented and she participated appropriately during our telephone visit.  Blood Pressure Weight BMI  BP Readings from Last 3 Encounters:  08/14/19 140/77  06/01/19 122/84  08/29/18 127/78   Wt Readings from Last 3 Encounters:  10/02/19 160 lb 4.4 oz (72.7 kg)  08/14/19 160 lb 3.2 oz (72.7 kg)  06/01/19 162 lb (73.5 kg)   BMI Readings from Last 1 Encounters:  10/02/19 25.10 kg/m    *Unable to obtain current vital signs, weight, and BMI due to telephone visit type  Hearing/Vision  . [place] did not seem to have difficulty with hearing/understanding during the telephone conversation . Reports that she has had a formal eye exam by an eye care professional within the past year . Reports that she has had a formal hearing evaluation within the past year *Unable to fully assess hearing and vision during telephone visit type  Cognitive Function: 6CIT Screen 10/02/2019  What Year? 0 points  What month? 0 points  What time? 0 points  Count back from 20 0 points  Months in reverse 4 points  Repeat phrase 0 points  Total Score 4   (Normal:0-7, Significant for Dysfunction: >8)  Normal Cognitive Function Screening: Yes   Immunization & Health Maintenance Record Immunization History    Administered Date(s) Administered  . Fluad Quad(high Dose 65+) 03/15/2019  . Influenza, High Dose Seasonal PF 04/17/2016, 03/31/2018  . Influenza,inj,Quad PF,6+ Mos 03/27/2013, 04/05/2014, 04/19/2015, 03/22/2017    Health Maintenance  Topic Date Due  . PNA vac Low Risk Adult (1 of 2 - PCV13) Never done  . PAP SMEAR-Modifier  09/20/2013  . OPHTHALMOLOGY EXAM  05/21/2017  . FOOT EXAM  02/11/2019  . HEMOGLOBIN A1C  03/01/2019  . MAMMOGRAM  04/01/2019  . DEXA SCAN  05/31/2020 (Originally 10/25/2005)  . TETANUS/TDAP  05/31/2020 (Originally 10/20/2013)  . INFLUENZA VACCINE  01/21/2020       Assessment  This is a routine wellness examination for [employer].  Health Maintenance: Due or Overdue Health Maintenance Due  Topic Date Due  . PNA vac Low Risk Adult (1 of 2 - PCV13) Never done  . PAP SMEAR-Modifier  09/20/2013  . OPHTHALMOLOGY EXAM  05/21/2017  . FOOT EXAM  02/11/2019  . HEMOGLOBIN A1C  03/01/2019  . MAMMOGRAM  04/01/2019    Priscilla Daniels does not need a referral for Community Assistance: Care Management:   no Social Work:    no Prescription Assistance:  no Nutrition/Diabetes Education:  no   Plan:  Personalized Goals Goals Addressed            This Visit's Progress   . Exercise 150 min/wk Moderate Activity       10/02/2019 AWV Goal: Exercise for General Health   Patient will verbalize understanding of the benefits of increased physical activity:  Exercising regularly is important. It will improve your overall fitness, flexibility, and endurance.  Regular exercise also will improve your overall health. It can help you control your weight, reduce stress, and improve your bone density.  Over the next year, patient will increase physical activity as tolerated with a goal of at least 150 minutes of moderate physical activity per week.   You can tell that you are exercising at a moderate intensity if your heart starts beating faster and you start  breathing faster but can still hold a conversation.  Moderate-intensity exercise  ideas include:  Walking 1 mile (1.6 km) in about 15 minutes  Biking  Hiking  Golfing  Dancing  Water aerobics  Patient will verbalize understanding of everyday activities that increase physical activity by providing examples like the following: ? Yard work, such as: ? Pushing a Surveyor, mining ? Raking and bagging leaves ? Washing your car ? Pushing a stroller ? Shoveling snow ? Gardening ? Washing windows or floors  Patient will be able to explain general safety guidelines for exercising:   Before you start a new exercise program, talk with your health care provider.  Do not exercise so much that you hurt yourself, feel dizzy, or get very short of breath.  Wear comfortable clothes and wear shoes with good support.  Drink plenty of water while you exercise to prevent dehydration or heat stroke.  Work out until your breathing and your heartbeat get faster.     . Have 3 meals a day       10/02/2019 AWV Goal: Improved Nutrition/Diet  . Patient will verbalize understanding that diet plays an important role in overall health and that a poor diet is a risk factor for many chronic medical conditions.  . Over the next year, patient will improve self management of their diet by incorporating better variety, improved meal pattern, more consistent meal timing, increased physical activity, improved protein intake, and better food choices. . Patient will utilize available community resources to help with food acquisition if needed (ex: food pantries, [address], etc) . Patient will work with nutrition specialist if a referral was made       Personalized Health Maintenance & Screening Recommendations  Pneumococcal vaccine  Td vaccine Screening mammography Screening Pap smear and pelvic exam  Diabetes screening  Lung Cancer Screening Recommended: no (Low Dose CT Chest recommended if Age 54-80 years, 30  pack-year currently smoking OR have quit w/in past 15 years) Hepatitis C Screening recommended: no HIV Screening recommended: no  Advanced Directives: Written information was not prepared per patient's request.  Referrals & Orders No orders of the defined types were placed in this encounter.   Follow-up Plan . Follow-up with Bennie Pierini, FNP as planned   I have personally reviewed and noted the following in the patient's chart:   . Medical and social history . Use of alcohol, tobacco or illicit drugs  . Current medications and supplements . Functional ability and status . Nutritional status . Physical activity . Advanced directives . List of other physicians . Hospitalizations, surgeries, and ER visits in previous 12 months . Vitals . Screenings to include cognitive, depression, and falls . Referrals and appointments  In addition, I have reviewed and discussed with Priscilla Daniels certain preventive protocols, quality metrics, and best practice recommendations. A written personalized care plan for preventive services as well as general preventive health recommendations is available and can be mailed to the patient at her request.      Caryl Bis  10/02/2019

## 2019-10-02 NOTE — Patient Instructions (Signed)
MEDICARE ANNUAL WELLNESS VISIT Health Maintenance Summary and Written Plan of Care  Priscilla Daniels ,  Thank you for allowing me to perform your Medicare Annual Wellness Visit and for your ongoing commitment to your health.   Health Maintenance & Immunization History Health Maintenance  Topic Date Due  . PNA vac Low Risk Adult (1 of 2 - PCV13) Never done  . PAP SMEAR-Modifier  09/20/2013  . OPHTHALMOLOGY EXAM  05/21/2017  . FOOT EXAM  02/11/2019  . HEMOGLOBIN A1C  03/01/2019  . MAMMOGRAM  04/01/2019  . DEXA SCAN  05/31/2020 (Originally 10/25/2005)  . TETANUS/TDAP  05/31/2020 (Originally 10/20/2013)  . INFLUENZA VACCINE  01/21/2020   Immunization History  Administered Date(s) Administered  . Fluad Quad(high Dose 65+) 03/15/2019  . Influenza, High Dose Seasonal PF 04/17/2016, 03/31/2018  . Influenza,inj,Quad PF,6+ Mos 03/27/2013, 04/05/2014, 04/19/2015, 03/22/2017    These are the patient goals that we discussed: Goals Addressed            This Visit's Progress   . Exercise 150 min/wk Moderate Activity       10/02/2019 AWV Goal: Exercise for General Health   Patient will verbalize understanding of the benefits of increased physical activity:  Exercising regularly is important. It will improve your overall fitness, flexibility, and endurance.  Regular exercise also will improve your overall health. It can help you control your weight, reduce stress, and improve your bone density.  Over the next year, patient will increase physical activity as tolerated with a goal of at least 150 minutes of moderate physical activity per week.   You can tell that you are exercising at a moderate intensity if your heart starts beating faster and you start breathing faster but can still hold a conversation.  Moderate-intensity exercise ideas include:  Walking 1 mile (1.6 km) in about 15 minutes  Biking  Hiking  Golfing  Dancing  Water aerobics  Patient will verbalize understanding of  everyday activities that increase physical activity by providing examples like the following: ? Yard work, such as: ? Pushing a Surveyor, mining ? Raking and bagging leaves ? Washing your car ? Pushing a stroller ? Shoveling snow ? Gardening ? Washing windows or floors  Patient will be able to explain general safety guidelines for exercising:   Before you start a new exercise program, talk with your health care provider.  Do not exercise so much that you hurt yourself, feel dizzy, or get very short of breath.  Wear comfortable clothes and wear shoes with good support.  Drink plenty of water while you exercise to prevent dehydration or heat stroke.  Work out until your breathing and your heartbeat get faster.     . Have 3 meals a day       10/02/2019 AWV Goal: Improved Nutrition/Diet  . Patient will verbalize understanding that diet plays an important role in overall health and that a poor diet is a risk factor for many chronic medical conditions.  . Over the next year, patient will improve self management of their diet by incorporating better variety, improved meal pattern, more consistent meal timing, increased physical activity, improved protein intake, and better food choices. . Patient will utilize available community resources to help with food acquisition if needed (ex: food pantries, [address], etc) . Patient will work with nutrition specialist if a referral was made         This is a list of Health Maintenance Items that are overdue or due now: Health Maintenance  Due  Topic Date Due  . PNA vac Low Risk Adult (1 of 2 - PCV13) Never done  . PAP SMEAR-Modifier  09/20/2013  . OPHTHALMOLOGY EXAM  05/21/2017  . FOOT EXAM  02/11/2019  . HEMOGLOBIN A1C  03/01/2019  . MAMMOGRAM  04/01/2019     Orders/Referrals Placed Today: No orders of the defined types were placed in this encounter.  (Contact our referral department at [phone] if you have not spoken with someone about  your referral appointment within the next 5 days)

## 2019-10-04 ENCOUNTER — Telehealth: Payer: Self-pay | Admitting: Nurse Practitioner

## 2019-10-04 NOTE — Chronic Care Management (AMB) (Signed)
  Chronic Care Management   Note  10/04/2019 Name: Priscilla Daniels MRN: [id number] DOB: 07-25-1940  [name] is a 79 y.o. year old female who is a primary care patient of Chevis Pretty, [place]. I reached out to Juliette Alcide by phone today in response to a referral sent by Ms. Maylie S Waymire's health plan.     Ms. Feigel was given information about Chronic Care Management services today including:  1. CCM service includes personalized support from designated clinical staff supervised by her physician, including individualized plan of care and coordination with other care providers 2. 24/7 contact phone numbers for assistance for urgent and routine care needs. 3. Service will only be billed when office clinical staff spend 20 minutes or more in a month to coordinate care. 4. Only one practitioner may furnish and bill the service in a calendar month. 5. The patient may stop CCM services at any time (effective at the end of the month) by phone call to the office staff. 6. The patient will be responsible for cost sharing (co-pay) of up to 20% of the service fee (after annual deductible is met).  Patient did not agree to enrollment in care management services and does not wish to consider at this time.  Follow up plan: The patient has been provided with contact information for the care management team and has been advised to call with any health related questions or concerns.   [place], [place] [address] Direct Dial: [phone] [email or web address] Website: [email or web address]

## 2019-11-01 ENCOUNTER — Encounter: Payer: Self-pay | Admitting: Family Medicine

## 2019-11-01 ENCOUNTER — Ambulatory Visit (INDEPENDENT_AMBULATORY_CARE_PROVIDER_SITE_OTHER): Payer: Medicare Other | Admitting: Family Medicine

## 2019-11-01 DIAGNOSIS — N3001 Acute cystitis with hematuria: Secondary | ICD-10-CM

## 2019-11-01 DIAGNOSIS — M545 Low back pain, unspecified: Secondary | ICD-10-CM

## 2019-11-01 DIAGNOSIS — R6889 Other general symptoms and signs: Secondary | ICD-10-CM | POA: Diagnosis not present

## 2019-11-01 LAB — URINALYSIS, COMPLETE
Bilirubin, UA: NEGATIVE
Glucose, UA: NEGATIVE
Nitrite, UA: NEGATIVE
Specific Gravity, UA: 1.025 (ref 1.005–1.030)
Urobilinogen, Ur: 1 mg/dL (ref 0.2–1.0)
pH, UA: 5.5 (ref 5.0–7.5)

## 2019-11-01 LAB — MICROSCOPIC EXAMINATION: WBC, UA: >30 /hpf — AB (ref 0–5)

## 2019-11-01 MED ORDER — CEFDINIR 300 MG PO CAPS: 300.0000 mg | ORAL_CAPSULE | Freq: Two times a day (BID) | ORAL | 0 refills | Status: AC

## 2019-11-01 NOTE — Progress Notes (Signed)
Virtual Visit via Telephone Note  I connected with Priscilla Daniels on 11/01/19 at 1:37 PM by telephone and verified that I am speaking with the correct person using two identifiers. Priscilla Daniels is currently located at home and her daughter is currently with her during this visit. The provider, Loman Brooklyn, FNP is located in their office at time of visit.  I discussed the limitations, risks, security and privacy concerns of performing an evaluation and management service by telephone and the availability of in person appointments. I also discussed with the patient that there may be a patient responsible charge related to this service. The patient expressed understanding and agreed to proceed.  Subjective: PCP: Chevis Pretty, FNP  Chief Complaint  Patient presents with  . Urinary Tract Infection   Urinary Tract Infection: Patient complains of low back pain and believes she has a kidney infection. States she had diarrhea two weeks ago and probably got it up front. She denies dysuria, urgency, frequency, fever, N/V, and upset stomach. Patient does have a history of recurrent UTI.  Patient does not have a history of pyelonephritis.    ROS: Per HPI  Current Outpatient Medications:  .  Baclofen 5 MG TABS, Take 5 mg by mouth 2 (two) times daily as needed., Disp: 60 tablet, Rfl: 2 .  diclofenac (VOLTAREN) 75 MG EC tablet, Take 1 tablet (75 mg total) by mouth 2 (two) times daily., Disp: 60 tablet, Rfl: 0 .  dorzolamide-timolol (COSOPT) 22.3-6.8 MG/ML ophthalmic solution, , Disp: , Rfl:  .  lisinopril-hydrochlorothiazide (ZESTORETIC) 20-12.5 MG tablet, Take 1 tablet by mouth daily., Disp: 90 tablet, Rfl: 1 .  TRAVATAN Z 0.004 % SOLN ophthalmic solution, , Disp: , Rfl:   Allergies  Allergen Reactions  . Lipitor [Atorvastatin Calcium]   . Septra [Bactrim]   . Sulfa Antibiotics    Past Medical History:  Diagnosis Date  . Abnormal vaginal Pap smear   . Candidiasis    cuteneous   . Chronic UTI   . COPD (chronic obstructive pulmonary disease) ([place]) 2010   Dx by CXR   . DM (diabetes mellitus) ([place])   . Fatigue   . Glaucoma   . Hyperlipidemia    Diet Controlled   . NIDDM (non-insulin dependent diabetes mellitus)   . Phlebitis   . Strep pharyngitis   . Vaginal atrophy   . Vertigo     Observations/Objective: A&O  No respiratory distress or wheezing audible over the phone Mood, judgement, and thought processes all WNL  Assessment and Plan: 1. Acute cystitis with hematuria - Education provided on UTIs. Encouraged adequate hydration.  - cefdinir (OMNICEF) 300 MG capsule; Take 1 capsule (300 mg total) by mouth 2 (two) times daily for 10 days.  Dispense: 20 capsule; Refill: 0  2. Acute low back pain without sciatica, unspecified back pain laterality - Urinalysis, Complete: Urine dipstick shows positive for RBC's, positive for protein, positive for leukocytes and positive for ketones.  Micro exam: > 30 WBC's per HPF, 3-10 RBC's per HPF and many bacteria. - Urine Culture - Microscopic Examination   Follow Up Instructions:  I discussed the assessment and treatment plan with the patient. The patient was provided an opportunity to ask questions and all were answered. The patient agreed with the plan and demonstrated an understanding of the instructions.   The patient was advised to call back or seek an in-person evaluation if the symptoms worsen or if the condition fails to improve as anticipated.  The above assessment and management plan was discussed with the patient. The patient verbalized understanding of and has agreed to the management plan. Patient is aware to call the clinic if symptoms persist or worsen. Patient is aware when to return to the clinic for a follow-up visit. Patient educated on when it is appropriate to go to the emergency department.   Time call ended: 1:45 PM  I provided 10 minutes of non-face-to-face time during this  encounter.  Deliah Boston, MSN, APRN, FNP-C [facility] 11/01/19

## 2019-11-04 LAB — URINE CULTURE

## 2019-11-04 NOTE — Patient Instructions (Signed)
Urinary Tract Infection, Adult A urinary tract infection (UTI) is an infection of any part of the urinary tract. The urinary tract includes:  The kidneys.  The ureters.  The bladder.  The urethra. These organs make, store, and get rid of pee (urine) in the body. What are the causes? This is caused by germs (bacteria) in your genital area. These germs grow and cause swelling (inflammation) of your urinary tract. What increases the risk? You are more likely to develop this condition if:  You have a small, thin tube (catheter) to drain pee.  You cannot control when you pee or poop (incontinence).  You are female, and: ? You use these methods to prevent pregnancy:  A medicine that kills sperm (spermicide).  A device that blocks sperm (diaphragm). ? You have low levels of a female hormone (estrogen). ? You are pregnant.  You have genes that add to your risk.  You are sexually active.  You take antibiotic medicines.  You have trouble peeing because of: ? A prostate that is bigger than normal, if you are female. ? A blockage in the part of your body that drains pee from the bladder (urethra). ? A kidney stone. ? A nerve condition that affects your bladder (neurogenic bladder). ? Not getting enough to drink. ? Not peeing often enough.  You have other conditions, such as: ? Diabetes. ? A weak disease-fighting system (immune system). ? Sickle cell disease. ? Gout. ? Injury of the spine. What are the signs or symptoms? Symptoms of this condition include:  Needing to pee right away (urgently).  Peeing often.  Peeing small amounts often.  Pain or burning when peeing.  Blood in the pee.  Pee that smells bad or not like normal.  Trouble peeing.  Pee that is cloudy.  Fluid coming from the vagina, if you are female.  Pain in the belly or lower back. Other symptoms include:  Throwing up (vomiting).  No urge to eat.  Feeling mixed up (confused).  Being tired  and grouchy (irritable).  A fever.  Watery poop (diarrhea). How is this treated? This condition may be treated with:  Antibiotic medicine.  Other medicines.  Drinking enough water. Follow these instructions at home:  Medicines  Take over-the-counter and prescription medicines only as told by your doctor.  If you were prescribed an antibiotic medicine, take it as told by your doctor. Do not stop taking it even if you start to feel better. General instructions  Make sure you: ? Pee until your bladder is empty. ? Do not hold pee for a long time. ? Empty your bladder after sex. ? Wipe from front to back after pooping if you are a female. Use each tissue one time when you wipe.  Drink enough fluid to keep your pee pale yellow.  Keep all follow-up visits as told by your doctor. This is important. Contact a doctor if:  You do not get better after 1-2 days.  Your symptoms go away and then come back. Get help right away if:  You have very bad back pain.  You have very bad pain in your lower belly.  You have a fever.  You are sick to your stomach (nauseous).  You are throwing up. Summary  A urinary tract infection (UTI) is an infection of any part of the urinary tract.  This condition is caused by germs in your genital area.  There are many risk factors for a UTI. These include having a small, thin   tube to drain pee and not being able to control when you pee or poop.  Treatment includes antibiotic medicines for germs.  Drink enough fluid to keep your pee pale yellow. This information is not intended to replace advice given to you by your health care provider. Make sure you discuss any questions you have with your health care provider. Document Revised: 05/26/2018 Document Reviewed: 12/16/2017 Elsevier Patient Education  2020 Elsevier Inc[employer]  

## 2019-11-30 ENCOUNTER — Other Ambulatory Visit: Payer: Self-pay

## 2019-11-30 ENCOUNTER — Ambulatory Visit (INDEPENDENT_AMBULATORY_CARE_PROVIDER_SITE_OTHER): Payer: Medicare Other | Admitting: Nurse Practitioner

## 2019-11-30 ENCOUNTER — Encounter: Payer: Self-pay | Admitting: Nurse Practitioner

## 2019-11-30 VITALS — BP 137/76 | HR 70 | Temp 96.9°F | Resp 20 | Ht 67.0 in | Wt 160.0 lb

## 2019-11-30 DIAGNOSIS — I10 Essential (primary) hypertension: Secondary | ICD-10-CM | POA: Diagnosis not present

## 2019-11-30 DIAGNOSIS — I700 Atherosclerosis of aorta: Secondary | ICD-10-CM | POA: Diagnosis not present

## 2019-11-30 DIAGNOSIS — N1831 Chronic kidney disease, stage 3a: Secondary | ICD-10-CM | POA: Diagnosis not present

## 2019-11-30 DIAGNOSIS — E785 Hyperlipidemia, unspecified: Secondary | ICD-10-CM

## 2019-11-30 DIAGNOSIS — E8881 Metabolic syndrome: Secondary | ICD-10-CM

## 2019-11-30 DIAGNOSIS — Z87440 Personal history of urinary (tract) infections: Secondary | ICD-10-CM

## 2019-11-30 LAB — URINALYSIS
Bilirubin, UA: NEGATIVE
Glucose, UA: NEGATIVE
Ketones, UA: NEGATIVE
Nitrite, UA: POSITIVE — AB
Specific Gravity, UA: 1.025 (ref 1.005–1.030)
Urobilinogen, Ur: 0.2 mg/dL (ref 0.2–1.0)
pH, UA: 5.5 (ref 5.0–7.5)

## 2019-11-30 MED ORDER — LISINOPRIL-HYDROCHLOROTHIAZIDE 20-12.5 MG PO TABS: 1.0000 | ORAL_TABLET | Freq: Every day | ORAL | 1 refills | Status: DC

## 2019-11-30 NOTE — Progress Notes (Signed)
Subjective:    Patient ID: Priscilla Daniels, female    DOB: 06/27/1940, 79 y.o.   MRN: [id number]   Chief Complaint: Medical Management of Chronic Issues (Back of head sore from fall in February)    HPI:  1. Essential hypertension No c/o chest pain, sob or headaches. Does not check blood pressure at home BP Readings from Last 3 Encounters:  11/30/19 137/76  08/14/19 140/77  06/01/19 122/84     2. Hyperlipidemia with target LDL less than 100 Does watch  diet and does very little exercise. Lab Results  Component Value Date   CHOL 204 (H) 06/01/2019   HDL 98 06/01/2019   LDLCALC 91 06/01/2019   TRIG 85 06/01/2019   CHOLHDL 2.1 06/01/2019     3. Atherosclerosis of aorta ([place]) Does not see cardiology. Says she does not feel she needs to.  4. Stage 3a chronic kidney disease No problems voiding Lab Results  Component Value Date   CREATININE 1.07 (H) 06/01/2019     5. Metabolic syndrome Does not check blood sugars at home. Dos try to avoid a lot of sweets.    Outpatient Encounter Medications as of 11/30/2019  Medication Sig  . Baclofen 5 MG TABS Take 5 mg by mouth 2 (two) times daily as needed.  . diclofenac (VOLTAREN) 75 MG EC tablet Take 1 tablet (75 mg total) by mouth 2 (two) times daily.  . dorzolamide-timolol (COSOPT) 22.3-6.8 MG/ML ophthalmic solution   . lisinopril-hydrochlorothiazide (ZESTORETIC) 20-12.5 MG tablet Take 1 tablet by mouth daily.  . TRAVATAN Z 0.004 % SOLN ophthalmic solution      Past Surgical History:  Procedure Laterality Date  . CERVICAL CONE BIOPSY    . GLAUCOMA SURGERY  12/04 & 07/05/03  . left eye cataract surgery  01/29/2009  . right eye cataract surgery  01/13/09  . VARICOSE VEIN SURGERY     both legs     Family History  Problem Relation Age of Onset  . Stroke Mother   . Cancer Father   . Cancer Sister   . Diabetes Brother   . Hyperlipidemia Brother   . Hyperlipidemia Brother     New complaints: None today  Social  history: Priscilla Daniels , her daughter lives with her.  Controlled substance contract: n/a    Review of Systems  Constitutional: Negative for diaphoresis.  Eyes: Negative for pain.  Respiratory: Negative for shortness of breath.   Cardiovascular: Negative for chest pain, palpitations and leg swelling.  Gastrointestinal: Negative for abdominal pain.  Endocrine: Negative for polydipsia.  Skin: Negative for rash.  Neurological: Negative for dizziness, weakness and headaches.  Hematological: Does not bruise/bleed easily.  All other systems reviewed and are negative.      Objective:   Physical Exam Vitals and nursing note reviewed.  Constitutional:      General: She is not in acute distress.    Appearance: Normal appearance. She is well-developed.  HENT:     Head: Normocephalic.     Nose: Nose normal.  Eyes:     Pupils: Pupils are equal, round, and reactive to light.  Neck:     Vascular: No carotid bruit or JVD.  Cardiovascular:     Rate and Rhythm: Normal rate and regular rhythm.     Heart sounds: Normal heart sounds.  Pulmonary:     Effort: Pulmonary effort is normal. No respiratory distress.     Breath sounds: Normal breath sounds. No wheezing or rales.  Chest:  Chest wall: No tenderness.  Abdominal:     General: Bowel sounds are normal. There is no distension or abdominal bruit.     Palpations: Abdomen is soft. There is no hepatomegaly, splenomegaly, mass or pulsatile mass.     Tenderness: There is no abdominal tenderness.  Musculoskeletal:        General: Normal range of motion.     Cervical back: Normal range of motion and neck supple.  Lymphadenopathy:     Cervical: No cervical adenopathy.  Skin:    General: Skin is warm and dry.  Neurological:     Mental Status: She is alert and oriented to person, place, and time.     Deep Tendon Reflexes: Reflexes are normal and symmetric.  Psychiatric:        Behavior: Behavior normal.        Thought Content: Thought content  normal.        Judgment: Judgment normal.    BP 137/76   Pulse 70   Temp (!) 96.9 F (36.1 C) (Temporal)   Resp 20   Ht 5' 7"  (1.702 m)   Wt 160 lb (72.6 kg)   SpO2 97%   BMI 25.06 kg/m         Assessment & Plan:  Priscilla Daniels comes in today with chief complaint of Medical Management of Chronic Issues (Back of head sore from fall in February)   Diagnosis and orders addressed:  1. Essential hypertension Low sodium ediet - lisinopril-hydrochlorothiazide (ZESTORETIC) 20-12.5 MG tablet; Take 1 tablet by mouth daily.  Dispense: 90 tablet; Refill: 1 - CBC with Differential/Platelet - CMP14+EGFR  2. Hyperlipidemia with target LDL less than 100 Low fat diet - Lipid panel  3. Atherosclerosis of aorta (HCC)  4. Stage 3a chronic kidney disease Labs pending  5. Metabolic syndrome Watch carbs in diet  6. Recent urinary tract infection clear - Urinalysis   Labs pending Health Maintenance reviewed Diet and exercise encouraged  Follow up plan: 6 months   [place], FNP

## 2019-11-30 NOTE — Patient Instructions (Signed)
Metabolic Syndrome Metabolic syndrome occurs when you have a combination of three or more factors that increase your chances of developing cardiovascular disease and diabetes. These factors include:  High fasting blood sugar (glucose).  High blood triglyceride level.  High blood pressure.  Low levels of high-density lipoprotein (HDL) blood cholesterol.  Having a waist measurement that is: ? More than 40 inches in men. ? More than 35 inches in women. Metabolic syndrome is sometimes called insulin resistance syndrome or syndrome X. What are the causes? The exact cause of this condition is not known. It may be related to a combination of the factors that were passed down from your parents (genes) and things that you do, eat, and drink (lifestyle choices). What increases the risk? You are more likely to develop this condition if you:  Eat a diet high in calories and saturated fat.  Do not exercise regularly.  Are obese.  Have a family history of type 2 diabetes mellitus.  Have insulin resistance.  Have a history of gestational diabetes during a previous pregnancy.  Have conditions such as cardiovascular disease, nonalcoholic fatty liver disease, or polycystic ovary syndrome (PCOS).  Are older. The risk increases with age.  Use any tobacco products, including cigarettes, chewing tobacco, or e-cigarettes. What are the signs or symptoms? Metabolic syndrome has no specific symptoms. Having abnormal blood test results may be the only signs of metabolic syndrome. How is this diagnosed? This condition may be diagnosed based on:  Your blood pressure measurements.  Your waist measurement.  Blood tests.  Your personal and family medical history. How is this treated? Treatment may include:  Lifestyle changes to reduce your risk for heart disease, stroke, and diabetes, such as: ? Exercise. ? Weight loss. ? Eating a healthy diet. ? Stopping tobacco and nicotine  use.  Medicines that: ? Help your body maintain normal blood glucose levels. ? Lower your blood pressure and your blood triglyceride levels. Follow these instructions at home:      Take over-the-counter and prescription medicines only as told by your health care provider.  Exercise regularly, as told by your health care provider.  Eat a healthy diet that includes fresh fruits and vegetables, whole grains, lean proteins, and low-fat or nonfat dairy products.  Maintain a healthy weight. Work with your health care provider to lose weight safely, if needed.  Do not use any products that contain nicotine or tobacco, such as cigarettes and e-cigarettes. If you need help quitting, ask your health care provider.  If directed, measure your waist regularly and write down the measurements. To measure your waist: ? Stand up straight. ? Breathe out. ? Wrap a measuring tape around the part of your waist that is just above your hip bones. ? Read and write down the measurement.  Keep all follow-up visits as told by your health care provider. This is important. Contact a health care provider if:  You feel very tired.  You are extremely thirsty.  You urinate a lot more than usual.  Your waist gets bigger.  You have headaches that do not go away. Get help right away if:  You suddenly develop any of the following: ? Dizziness. ? Blurry vision. ? Trouble speaking. ? Trouble swallowing. ? Weakness in an arm or leg. ? Chest pain. ? Trouble breathing.  Your heartbeat feels abnormal.  You faint. Summary  Metabolic syndrome occurs when you have a combination of three or more factors that increase your chances of developing cardiovascular disease and  diabetes.  These factors include a high fasting blood sugar (glucose), high blood triglyceride level, high blood pressure, low levels of high-density lipoprotein (HDL) blood cholesterol, and a waist measurement that is more than 40 inches in  men or more than 35 inches in women.  Metabolic syndrome has no specific symptoms. Having abnormal blood test results may be the only signs of metabolic syndrome.  Treatment may include lifestyle changes and medicine to reduce your risk for heart disease, stroke, and diabetes. This information is not intended to replace advice given to you by your health care provider. Make sure you discuss any questions you have with your health care provider. Document Revised: 06/21/2017 Document Reviewed: 06/21/2017 Elsevier Patient Education  2020 [employer].

## 2019-12-01 LAB — CMP14+EGFR
ALT: 17 IU/L (ref 0–32)
AST: 19 IU/L (ref 0–40)
Albumin/Globulin Ratio: 1.5 (ref 1.2–2.2)
Albumin: 4.2 g/dL (ref 3.7–4.7)
Alkaline Phosphatase: 65 IU/L (ref 48–121)
BUN/Creatinine Ratio: 24 (ref 12–28)
BUN: 26 mg/dL (ref 8–27)
Bilirubin Total: 0.2 mg/dL (ref 0.0–1.2)
CO2: 21 mmol/L (ref 20–29)
Calcium: 9.5 mg/dL (ref 8.7–10.3)
Chloride: 101 mmol/L (ref 96–106)
Creatinine, Ser: 1.07 mg/dL — ABNORMAL HIGH (ref 0.57–1.00)
GFR calc Af Amer: 57 mL/min/{1.73_m2} — ABNORMAL LOW (ref >59)
GFR calc non Af Amer: 49 mL/min/{1.73_m2} — ABNORMAL LOW (ref >59)
Globulin, Total: 2.8 g/dL (ref 1.5–4.5)
Glucose: 104 mg/dL — ABNORMAL HIGH (ref 65–99)
Potassium: 4.6 mmol/L (ref 3.5–5.2)
Sodium: 138 mmol/L (ref 134–144)
Total Protein: 7 g/dL (ref 6.0–8.5)

## 2019-12-01 LAB — CBC WITH DIFFERENTIAL/PLATELET
Basophils Absolute: 0.1 10*3/uL (ref 0.0–0.2)
Basos: 1 %
EOS (ABSOLUTE): 0.3 10*3/uL (ref 0.0–0.4)
Eos: 5 %
Hematocrit: 38.5 % (ref 34.0–46.6)
Hemoglobin: 12.7 g/dL (ref 11.1–15.9)
Immature Grans (Abs): 0 10*3/uL (ref 0.0–0.1)
Immature Granulocytes: 0 %
Lymphocytes Absolute: 1.7 10*3/uL (ref 0.7–3.1)
Lymphs: 30 %
MCH: 28.8 pg (ref 26.6–33.0)
MCHC: 33 g/dL (ref 31.5–35.7)
MCV: 87 fL (ref 79–97)
Monocytes Absolute: 0.6 10*3/uL (ref 0.1–0.9)
Monocytes: 10 %
Neutrophils Absolute: 3.1 10*3/uL (ref 1.4–7.0)
Neutrophils: 54 %
Platelets: 240 10*3/uL (ref 150–450)
RBC: 4.41 x10E6/uL (ref 3.77–5.28)
RDW: 12.3 % (ref 11.7–15.4)
WBC: 5.7 10*3/uL (ref 3.4–10.8)

## 2019-12-01 LAB — LIPID PANEL
Chol/HDL Ratio: 2.1 ratio (ref 0.0–4.4)
Cholesterol, Total: 200 mg/dL — ABNORMAL HIGH (ref 100–199)
HDL: 95 mg/dL (ref >39)
LDL Chol Calc (NIH): 89 mg/dL (ref 0–99)
Triglycerides: 92 mg/dL (ref 0–149)
VLDL Cholesterol Cal: 16 mg/dL (ref 5–40)

## 2019-12-07 DIAGNOSIS — H401131 Primary open-angle glaucoma, bilateral, mild stage: Secondary | ICD-10-CM | POA: Diagnosis not present

## 2019-12-07 DIAGNOSIS — H353111 Nonexudative age-related macular degeneration, right eye, early dry stage: Secondary | ICD-10-CM | POA: Diagnosis not present

## 2019-12-07 DIAGNOSIS — H353122 Nonexudative age-related macular degeneration, left eye, intermediate dry stage: Secondary | ICD-10-CM | POA: Diagnosis not present

## 2020-01-23 ENCOUNTER — Other Ambulatory Visit: Payer: Self-pay

## 2020-01-23 ENCOUNTER — Ambulatory Visit (INDEPENDENT_AMBULATORY_CARE_PROVIDER_SITE_OTHER): Payer: Medicare Other | Admitting: Ophthalmology

## 2020-01-23 ENCOUNTER — Encounter (INDEPENDENT_AMBULATORY_CARE_PROVIDER_SITE_OTHER): Payer: Self-pay | Admitting: Ophthalmology

## 2020-01-23 DIAGNOSIS — H47022 Hemorrhage in optic nerve sheath, left eye: Secondary | ICD-10-CM

## 2020-01-23 DIAGNOSIS — H348122 Central retinal vein occlusion, left eye, stable: Secondary | ICD-10-CM

## 2020-01-23 DIAGNOSIS — H401132 Primary open-angle glaucoma, bilateral, moderate stage: Secondary | ICD-10-CM

## 2020-01-23 DIAGNOSIS — H353132 Nonexudative age-related macular degeneration, bilateral, intermediate dry stage: Secondary | ICD-10-CM | POA: Diagnosis not present

## 2020-01-23 DIAGNOSIS — H3562 Retinal hemorrhage, left eye: Secondary | ICD-10-CM | POA: Diagnosis not present

## 2020-01-23 HISTORY — DX: Hemorrhage in optic nerve sheath, left eye: H47.022

## 2020-01-23 NOTE — Assessment & Plan Note (Addendum)
Condition OS remained stable and Compensated

## 2020-01-23 NOTE — Assessment & Plan Note (Signed)

## 2020-01-23 NOTE — Progress Notes (Signed)
01/23/2020     CHIEF COMPLAINT Patient presents for Retina Follow Up   HISTORY OF PRESENT ILLNESS: Priscilla Daniels is a 79 y.o. female who presents to the clinic today for:   HPI    Retina Follow Up    Patient presents with  Dry AMD.  In both eyes.  Severity is moderate.  Duration of 1 year.  Since onset it is stable.  I, the attending physician,  performed the HPI with the patient and updated documentation appropriately.          Comments    1 Year AMD  And hx  Of CRVO, OS f\u OU. OCT  Pt states OU vision is not as good as it used to be. Pt states vision is better some days than others. Pt had a fall in Feb and hit her head. Pt using gtts as directed.       Last edited by Edmon Crape, MD on 01/23/2020 10:49 AM. (History)      Referring physician: Bennie Pierini, FNP [id number] [place],  [place] [address]  HISTORICAL INFORMATION:   Selected notes from the MEDICAL RECORD NUMBER    Lab Results  Component Value Date   HGBA1C 6.2 08/29/2018     CURRENT MEDICATIONS: Current Outpatient Medications (Ophthalmic Drugs)  Medication Sig  . dorzolamide-timolol (COSOPT) 22.3-6.8 MG/ML ophthalmic solution   . TRAVATAN Z 0.004 % SOLN ophthalmic solution    No current facility-administered medications for this visit. (Ophthalmic Drugs)   Current Outpatient Medications (Other)  Medication Sig  . Baclofen 5 MG TABS Take 5 mg by mouth 2 (two) times daily as needed.  . diclofenac (VOLTAREN) 75 MG EC tablet Take 1 tablet (75 mg total) by mouth 2 (two) times daily.  Marland Kitchen lisinopril-hydrochlorothiazide (ZESTORETIC) 20-12.5 MG tablet Take 1 tablet by mouth daily.   No current facility-administered medications for this visit. (Other)      REVIEW OF SYSTEMS:    ALLERGIES Allergies  Allergen Reactions  . Lipitor [Atorvastatin Calcium]   . Septra [Bactrim]   . Sulfa Antibiotics     PAST MEDICAL HISTORY Past Medical History:  Diagnosis Date  . Abnormal vaginal  Pap smear   . Candidiasis    cuteneous   . Chronic UTI   . COPD (chronic obstructive pulmonary disease) (HCC) 2010   Dx by CXR   . DM (diabetes mellitus) (HCC)   . Fatigue   . Glaucoma   . Hemorrhage in optic nerve sheath of left eye 01/23/2020  . Hyperlipidemia    Diet Controlled   . NIDDM (non-insulin dependent diabetes mellitus)   . Phlebitis   . Strep pharyngitis   . Vaginal atrophy   . Vertigo    Past Surgical History:  Procedure Laterality Date  . CERVICAL CONE BIOPSY    . GLAUCOMA SURGERY  12/04 & 07/05/03  . left eye cataract surgery  01/29/2009  . right eye cataract surgery  01/13/09  . VARICOSE VEIN SURGERY     both legs     FAMILY HISTORY Family History  Problem Relation Age of Onset  . Stroke Mother   . Cancer Father   . Cancer Sister   . Diabetes Brother   . Hyperlipidemia Brother   . Hyperlipidemia Brother     SOCIAL HISTORY Social History   Tobacco Use  . Smoking status: Never Smoker  . Smokeless tobacco: Never Used  Vaping Use  . Vaping Use: Never used  Substance Use Topics  .  Alcohol use: No  . Drug use: No         OPHTHALMIC EXAM:  Base Eye Exam    Visual Acuity (Snellen - Linear)      Right Left   Dist cc 20/25 -1 20/40 -1   Dist ph cc  20/30 -1       Tonometry (Tonopen, 9:51 AM)      Right Left   Pressure 13 13       Pupils      Pupils Dark Light Shape React APD   Right PERRL 3 2 Round Slow None   Left PERRL 3 2 Round Slow None       Visual Fields (Counting fingers)      Left Right     Full   Restrictions Partial outer inferior temporal deficiency        Neuro/Psych    Mood/Affect: Normal       Dilation    Both eyes: 1.0% Mydriacyl, 2.5% Phenylephrine @ 9:51 AM        Slit Lamp and Fundus Exam    External Exam      Right Left   External Normal Normal       Slit Lamp Exam      Right Left   Lids/Lashes Normal Normal   Conjunctiva/Sclera White and quiet White and quiet   Cornea Clear Clear   Anterior  Chamber Deep and quiet Deep and quiet   Iris Round and reactive Round and reactive   Lens Centered posterior chamber intraocular lens Centered posterior chamber intraocular lens   Anterior Vitreous Normal Normal       Fundus Exam      Right Left   Posterior Vitreous Normal    Disc Normal  Collaterals on the nerve   C/D Ratio 0.4 0.75   Macula Normal Normal   Vessels Normal Old compensated CRV O, no active leakages, no hemorrhages   Periphery Normal Normal          IMAGING AND PROCEDURES  Imaging and Procedures for 01/23/20  OCT, Retina - OU - Both Eyes       Right Eye Quality was good. Scan locations included subfoveal. Central Foveal Thickness: 262. Findings include retinal drusen , abnormal foveal contour, no SRF, no IRF.   Left Eye Quality was good. Scan locations included subfoveal. Central Foveal Thickness: 244. Progression has been stable. Findings include abnormal foveal contour, retinal drusen , no SRF, no IRF.   Notes OS with incidental PVD, diffuse regions of atrophy from prior CRV with secondary ischemia, now inactive                ASSESSMENT/PLAN:  Central retinal vein occlusion of left eye Condition OS remained stable and Compensated  Intermediate stage nonexudative age-related macular degeneration of both eyes The nature of age--related macular degeneration was discussed with the patient as well as the distinction between dry and wet types. Checking an Amsler Grid daily with advice to return immediately should a distortion develop, was given to the patient. The patient 's smoking status now and in the past was determined and advice based on the AREDS study was provided regarding the consumption of antioxidant supplements. AREDS 2 vitamin formulation was recommended. Consumption of dark leafy vegetables and fresh fruits of various colors was recommended. Treatment modalities for wet macular degeneration particularly the use of intravitreal injections of  anti-blood vessel growth factors was discussed with the patient. Avastin, Lucentis, and Eylea are the available options. On  occasion, therapy includes the use of photodynamic therapy and thermal laser. Stressed to the patient do not rub eyes.  Patient was advised to check Amsler Grid daily and return immediately if changes are noted. Instructions on using the grid were given to the patient. All patient questions were answered.      ICD-10-CM   1. Stable central retinal vein occlusion of left eye  H34.8122 OCT, Retina - OU - Both Eyes  2. Intermediate stage nonexudative age-related macular degeneration of both eyes  H35.3132 OCT, Retina - OU - Both Eyes  3. Primary open angle glaucoma of both eyes, moderate stage  H40.1132   4. Retinal hemorrhage of left eye  H35.62   5. Hemorrhage in optic nerve sheath of left eye  H47.022     1.  2.  3.  Ophthalmic Meds Ordered this visit:  No orders of the defined types were placed in this encounter.      Return in about 6 months (around 07/25/2020) for DILATE OU, OCT.  There are no Patient Instructions on file for this visit.   Explained the diagnoses, plan, and follow up with the patient and they expressed understanding.  Patient expressed understanding of the importance of proper follow up care.   Alford Highland Ronald Vinsant M.D. Diseases & Surgery of the Retina and Vitreous [facility] 01/23/20     Abbreviations: M myopia (nearsighted); A astigmatism; H hyperopia (farsighted); P presbyopia; Mrx spectacle prescription;  CTL contact lenses; OD right eye; OS left eye; OU both eyes  XT exotropia; ET esotropia; PEK punctate epithelial keratitis; PEE punctate epithelial erosions; DES dry eye syndrome; MGD meibomian gland dysfunction; ATs artificial tears; PFAT's preservative free artificial tears; NSC nuclear sclerotic cataract; PSC posterior subcapsular cataract; ERM epi-retinal membrane; PVD posterior vitreous detachment; RD retinal  detachment; DM diabetes mellitus; DR diabetic retinopathy; NPDR non-proliferative diabetic retinopathy; PDR proliferative diabetic retinopathy; CSME clinically significant macular edema; DME diabetic macular edema; dbh dot blot hemorrhages; CWS cotton wool spot; POAG primary open angle glaucoma; C/D cup-to-disc ratio; HVF humphrey visual field; GVF goldmann visual field; OCT optical coherence tomography; IOP intraocular pressure; BRVO Branch retinal vein occlusion; CRVO central retinal vein occlusion; CRAO central retinal artery occlusion; BRAO branch retinal artery occlusion; RT retinal tear; SB scleral buckle; PPV pars plana vitrectomy; VH Vitreous hemorrhage; PRP panretinal laser photocoagulation; IVK intravitreal kenalog; VMT vitreomacular traction; MH Macular hole;  NVD neovascularization of the disc; NVE neovascularization elsewhere; AREDS age related eye disease study; ARMD age related macular degeneration; POAG primary open angle glaucoma; EBMD epithelial/anterior basement membrane dystrophy; ACIOL anterior chamber intraocular lens; IOL intraocular lens; PCIOL posterior chamber intraocular lens; Phaco/IOL phacoemulsification with intraocular lens placement; PRK photorefractive keratectomy; LASIK laser assisted in situ keratomileusis; HTN hypertension; DM diabetes mellitus; COPD chronic obstructive pulmonary disease

## 2020-03-15 ENCOUNTER — Ambulatory Visit (INDEPENDENT_AMBULATORY_CARE_PROVIDER_SITE_OTHER): Payer: Medicare Other | Admitting: Family

## 2020-03-15 ENCOUNTER — Encounter: Payer: Self-pay | Admitting: Family

## 2020-03-15 ENCOUNTER — Other Ambulatory Visit: Payer: Self-pay

## 2020-03-15 ENCOUNTER — Ambulatory Visit: Payer: Medicare Other

## 2020-03-15 VITALS — BP 171/80 | HR 60 | Temp 97.4°F | Ht 67.0 in | Wt 159.6 lb

## 2020-03-15 DIAGNOSIS — R300 Dysuria: Secondary | ICD-10-CM | POA: Diagnosis not present

## 2020-03-15 DIAGNOSIS — N3000 Acute cystitis without hematuria: Secondary | ICD-10-CM | POA: Diagnosis not present

## 2020-03-15 DIAGNOSIS — M542 Cervicalgia: Secondary | ICD-10-CM | POA: Diagnosis not present

## 2020-03-15 LAB — URINALYSIS, COMPLETE
Bilirubin, UA: NEGATIVE
Glucose, UA: NEGATIVE
Ketones, UA: NEGATIVE
Nitrite, UA: POSITIVE — AB
Protein,UA: NEGATIVE
RBC, UA: NEGATIVE
Specific Gravity, UA: 1.025 (ref 1.005–1.030)
Urobilinogen, Ur: 0.2 mg/dL (ref 0.2–1.0)
pH, UA: 6 (ref 5.0–7.5)

## 2020-03-15 LAB — MICROSCOPIC EXAMINATION
RBC, Urine: NONE SEEN /hpf (ref 0–2)
WBC, UA: >30 /hpf — AB (ref 0–5)

## 2020-03-15 MED ORDER — DICLOFENAC SODIUM 75 MG PO TBEC: 75.0000 mg | DELAYED_RELEASE_TABLET | Freq: Two times a day (BID) | ORAL | 0 refills | Status: DC

## 2020-03-15 MED ORDER — CEPHALEXIN 500 MG PO CAPS: 500.0000 mg | ORAL_CAPSULE | Freq: Two times a day (BID) | ORAL | 0 refills | Status: DC

## 2020-03-15 NOTE — Patient Instructions (Signed)

## 2020-03-15 NOTE — Progress Notes (Signed)
Subjective:    Patient ID: Priscilla Daniels, female    DOB: 03/05/1941, 79 y.o.   MRN: [id number]  Chief Complaint  Patient presents with   Dysuria    Dysuria  This is a new problem. The current episode started 1 to 4 weeks ago. The problem occurs intermittently. The problem has been waxing and waning. The quality of the pain is described as burning. The pain is at a severity of 1/10. The pain is mild. Associated symptoms include flank pain, frequency, hesitancy and urgency. Pertinent negatives include no discharge, hematuria, nausea or vomiting. She has tried increased fluids for the symptoms. The treatment provided mild relief.      Review of Systems  Gastrointestinal: Negative for nausea and vomiting.  Genitourinary: Positive for dysuria, flank pain, frequency, hesitancy and urgency. Negative for hematuria.  All other systems reviewed and are negative.      Objective:   Physical Exam Vitals reviewed.  Constitutional:      General: She is not in acute distress.    Appearance: She is well-developed.  HENT:     Head: Normocephalic and atraumatic.  Eyes:     Pupils: Pupils are equal, round, and reactive to light.  Neck:     Thyroid: No thyromegaly.  Cardiovascular:     Rate and Rhythm: Normal rate and regular rhythm.     Heart sounds: Normal heart sounds. No murmur heard.   Pulmonary:     Effort: Pulmonary effort is normal. No respiratory distress.     Breath sounds: Normal breath sounds. No wheezing.  Abdominal:     General: Bowel sounds are normal. There is no distension.     Palpations: Abdomen is soft.     Tenderness: There is no abdominal tenderness (no tenderness noted).  Musculoskeletal:        General: No tenderness. Normal range of motion.     Cervical back: Normal range of motion and neck supple.  Skin:    General: Skin is warm and dry.  Neurological:     Mental Status: She is alert and oriented to person, place, and time.     Cranial Nerves: No cranial  nerve deficit.     Deep Tendon Reflexes: Reflexes are normal and symmetric.  Psychiatric:        Behavior: Behavior normal.        Thought Content: Thought content normal.        Judgment: Judgment normal.       BP (!) 171/80    Pulse 60    Temp (!) 97.4 F (36.3 C) (Temporal)    Ht 5\' 7"  (1.702 m)    Wt 159 lb 9.6 oz (72.4 kg)    SpO2 100%    BMI 25.00 kg/m      Assessment & Plan:  Priscilla Daniels comes in today with chief complaint of Dysuria   Diagnosis and orders addressed:  1. Dysuria - Urinalysis, Complete - Urine Culture - cephALEXin (KEFLEX) 500 MG capsule; Take 1 capsule (500 mg total) by mouth 2 (two) times daily.  Dispense: 14 capsule; Refill: 0  2. Acute cystitis without hematuria Force fluids AZO over the counter X2 days RTO prn Culture pending - cephALEXin (KEFLEX) 500 MG capsule; Take 1 capsule (500 mg total) by mouth 2 (two) times daily.  Dispense: 14 capsule; Refill: 0  3. Neck pain - diclofenac (VOLTAREN) 75 MG EC tablet; Take 1 tablet (75 mg total) by mouth 2 (two) times daily.  Dispense:  60 tablet; Refill: 0   Jannifer Rodney, FNP

## 2020-03-20 LAB — URINE CULTURE

## 2020-03-21 DIAGNOSIS — H401131 Primary open-angle glaucoma, bilateral, mild stage: Secondary | ICD-10-CM | POA: Diagnosis not present

## 2020-04-02 ENCOUNTER — Ambulatory Visit (INDEPENDENT_AMBULATORY_CARE_PROVIDER_SITE_OTHER): Payer: Medicare Other

## 2020-04-02 ENCOUNTER — Other Ambulatory Visit: Payer: Self-pay

## 2020-04-02 DIAGNOSIS — Z23 Encounter for immunization: Secondary | ICD-10-CM | POA: Diagnosis not present

## 2020-05-20 ENCOUNTER — Ambulatory Visit (INDEPENDENT_AMBULATORY_CARE_PROVIDER_SITE_OTHER): Payer: Medicare Other | Admitting: Family

## 2020-05-20 ENCOUNTER — Encounter: Payer: Self-pay | Admitting: Family

## 2020-05-20 DIAGNOSIS — R399 Unspecified symptoms and signs involving the genitourinary system: Secondary | ICD-10-CM

## 2020-05-20 DIAGNOSIS — I10 Essential (primary) hypertension: Secondary | ICD-10-CM | POA: Diagnosis not present

## 2020-05-20 MED ORDER — LISINOPRIL-HYDROCHLOROTHIAZIDE 20-12.5 MG PO TABS: 1.0000 | ORAL_TABLET | Freq: Every day | ORAL | 0 refills | Status: DC

## 2020-05-20 MED ORDER — AMOXICILLIN-POT CLAVULANATE 875-125 MG PO TABS: 1.0000 | ORAL_TABLET | Freq: Two times a day (BID) | ORAL | 0 refills | Status: DC

## 2020-05-20 NOTE — Progress Notes (Signed)
Virtual Visit via telephone Note Due to COVID-19 pandemic this visit was conducted virtually. This visit type was conducted due to national recommendations for restrictions regarding the COVID-19 Pandemic (e.g. social distancing, sheltering in place) in an effort to limit this patient's exposure and mitigate transmission in our community. All issues noted in this document were discussed and addressed.  A physical exam was not performed with this format.  I connected with Priscilla Daniels on 05/20/20 at 11:57 AM  by telephone and verified that I am speaking with the correct person using two identifiers. Priscilla Daniels is currently located at home and no one is currently with her during visit. The provider, Jannifer Rodney, FNP is located in their office at time of visit.  I discussed the limitations, risks, security and privacy concerns of performing an evaluation and management service by telephone and the availability of in person appointments. I also discussed with the patient that there may be a patient responsible charge related to this service. The patient expressed understanding and agreed to proceed.   History and Present Illness:  Dysuria  This is a new problem. The current episode started 1 to 4 weeks ago. The problem occurs intermittently. The problem has been rapidly improving. The quality of the pain is described as burning. The pain is mild. There has been no fever. Associated symptoms include flank pain ("some times"), frequency and urgency. Pertinent negatives include no hematuria, hesitancy, nausea or vomiting. She has tried increased fluids for the symptoms. The treatment provided mild relief.  Hypertension This is a chronic problem. The current episode started more than 1 year ago. The problem has been resolved since onset. The problem is controlled. Associated symptoms include malaise/fatigue and shortness of breath ("some times"). Pertinent negatives include no peripheral edema.  The current treatment provides moderate improvement.      Review of Systems  Constitutional: Positive for malaise/fatigue.  Respiratory: Positive for shortness of breath ("some times").   Gastrointestinal: Negative for nausea and vomiting.  Genitourinary: Positive for dysuria, flank pain ("some times"), frequency and urgency. Negative for hematuria and hesitancy.  All other systems reviewed and are negative.    Observations/Objective: No SOB or distress noted   Assessment and Plan: 1. UTI symptoms Force fluids AZO over the counter X2 days RTO if symptoms worsen or do not improve  - amoxicillin-clavulanate (AUGMENTIN) 875-125 MG tablet; Take 1 tablet by mouth 2 (two) times daily.  Dispense: 14 tablet; Refill: 0  2. Essential hypertension Will refill medications and keep PCP - lisinopril-hydrochlorothiazide (ZESTORETIC) 20-12.5 MG tablet; Take 1 tablet by mouth daily.  Dispense: 90 tablet; Refill: 0     I discussed the assessment and treatment plan with the patient. The patient was provided an opportunity to ask questions and all were answered. The patient agreed with the plan and demonstrated an understanding of the instructions.   The patient was advised to call back or seek an in-person evaluation if the symptoms worsen or if the condition fails to improve as anticipated.  The above assessment and management plan was discussed with the patient. The patient verbalized understanding of and has agreed to the management plan. Patient is aware to call the clinic if symptoms persist or worsen. Patient is aware when to return to the clinic for a follow-up visit. Patient educated on when it is appropriate to go to the emergency department.   Time call ended:  12:09 pm  I provided 12 minutes of non-face-to-face time during this encounter.  Evelina Dun, FNP

## 2020-05-22 DIAGNOSIS — H9209 Otalgia, unspecified ear: Secondary | ICD-10-CM | POA: Diagnosis not present

## 2020-05-22 DIAGNOSIS — H6982 Other specified disorders of Eustachian tube, left ear: Secondary | ICD-10-CM | POA: Diagnosis not present

## 2020-05-22 DIAGNOSIS — J069 Acute upper respiratory infection, unspecified: Secondary | ICD-10-CM | POA: Diagnosis not present

## 2020-06-06 ENCOUNTER — Other Ambulatory Visit: Payer: Self-pay

## 2020-06-06 ENCOUNTER — Ambulatory Visit (INDEPENDENT_AMBULATORY_CARE_PROVIDER_SITE_OTHER): Payer: Medicare Other | Admitting: Nurse Practitioner

## 2020-06-06 ENCOUNTER — Encounter: Payer: Self-pay | Admitting: Nurse Practitioner

## 2020-06-06 VITALS — BP 113/81 | HR 75 | Temp 96.4°F | Resp 20 | Ht 67.0 in | Wt 148.0 lb

## 2020-06-06 DIAGNOSIS — E8881 Metabolic syndrome: Secondary | ICD-10-CM | POA: Diagnosis not present

## 2020-06-06 DIAGNOSIS — E785 Hyperlipidemia, unspecified: Secondary | ICD-10-CM

## 2020-06-06 DIAGNOSIS — N1831 Chronic kidney disease, stage 3a: Secondary | ICD-10-CM

## 2020-06-06 DIAGNOSIS — I10 Essential (primary) hypertension: Secondary | ICD-10-CM | POA: Diagnosis not present

## 2020-06-06 DIAGNOSIS — R739 Hyperglycemia, unspecified: Secondary | ICD-10-CM | POA: Diagnosis not present

## 2020-06-06 DIAGNOSIS — I700 Atherosclerosis of aorta: Secondary | ICD-10-CM | POA: Diagnosis not present

## 2020-06-06 LAB — CMP14+EGFR
ALT: 15 IU/L (ref 0–32)
AST: 18 IU/L (ref 0–40)
Albumin/Globulin Ratio: 1.6 (ref 1.2–2.2)
Albumin: 4.5 g/dL (ref 3.7–4.7)
Alkaline Phosphatase: 65 IU/L (ref 44–121)
BUN/Creatinine Ratio: 19 (ref 12–28)
BUN: 23 mg/dL (ref 8–27)
Bilirubin Total: 0.3 mg/dL (ref 0.0–1.2)
CO2: 22 mmol/L (ref 20–29)
Calcium: 9.9 mg/dL (ref 8.7–10.3)
Chloride: 97 mmol/L (ref 96–106)
Creatinine, Ser: 1.2 mg/dL — ABNORMAL HIGH (ref 0.57–1.00)
GFR calc Af Amer: 50 mL/min/{1.73_m2} — ABNORMAL LOW (ref >59)
GFR calc non Af Amer: 43 mL/min/{1.73_m2} — ABNORMAL LOW (ref >59)
Globulin, Total: 2.9 g/dL (ref 1.5–4.5)
Glucose: 131 mg/dL — ABNORMAL HIGH (ref 65–99)
Potassium: 4.3 mmol/L (ref 3.5–5.2)
Sodium: 135 mmol/L (ref 134–144)
Total Protein: 7.4 g/dL (ref 6.0–8.5)

## 2020-06-06 LAB — URINALYSIS, COMPLETE
Bilirubin, UA: NEGATIVE
Glucose, UA: NEGATIVE
Ketones, UA: NEGATIVE
Nitrite, UA: POSITIVE — AB
Specific Gravity, UA: 1.02 (ref 1.005–1.030)
Urobilinogen, Ur: 0.2 mg/dL (ref 0.2–1.0)
pH, UA: 5.5 (ref 5.0–7.5)

## 2020-06-06 LAB — CBC WITH DIFFERENTIAL/PLATELET
Basophils Absolute: 0 10*3/uL (ref 0.0–0.2)
Basos: 0 %
EOS (ABSOLUTE): 0 10*3/uL (ref 0.0–0.4)
Eos: 0 %
Hematocrit: 39.9 % (ref 34.0–46.6)
Hemoglobin: 13.3 g/dL (ref 11.1–15.9)
Immature Grans (Abs): 0 10*3/uL (ref 0.0–0.1)
Immature Granulocytes: 0 %
Lymphocytes Absolute: 1.9 10*3/uL (ref 0.7–3.1)
Lymphs: 28 %
MCH: 28.5 pg (ref 26.6–33.0)
MCHC: 33.3 g/dL (ref 31.5–35.7)
MCV: 85 fL (ref 79–97)
Monocytes Absolute: 0.7 10*3/uL (ref 0.1–0.9)
Monocytes: 11 %
Neutrophils Absolute: 4.1 10*3/uL (ref 1.4–7.0)
Neutrophils: 61 %
Platelets: 266 10*3/uL (ref 150–450)
RBC: 4.67 x10E6/uL (ref 3.77–5.28)
RDW: 12.2 % (ref 11.7–15.4)
WBC: 6.8 10*3/uL (ref 3.4–10.8)

## 2020-06-06 LAB — BAYER DCA HB A1C WAIVED: HB A1C (BAYER DCA - WAIVED): 6.3 % (ref <7.0)

## 2020-06-06 LAB — LIPID PANEL
Chol/HDL Ratio: 2.4 ratio (ref 0.0–4.4)
Cholesterol, Total: 206 mg/dL — ABNORMAL HIGH (ref 100–199)
HDL: 87 mg/dL (ref >39)
LDL Chol Calc (NIH): 99 mg/dL (ref 0–99)
Triglycerides: 115 mg/dL (ref 0–149)
VLDL Cholesterol Cal: 20 mg/dL (ref 5–40)

## 2020-06-06 LAB — MICROSCOPIC EXAMINATION: WBC, UA: >30 /hpf — AB (ref 0–5)

## 2020-06-06 MED ORDER — LISINOPRIL-HYDROCHLOROTHIAZIDE 20-12.5 MG PO TABS: 1.0000 | ORAL_TABLET | Freq: Every day | ORAL | 0 refills | Status: DC

## 2020-06-06 NOTE — Patient Instructions (Signed)

## 2020-06-06 NOTE — Progress Notes (Signed)
Subjective:    Patient ID: Priscilla Daniels, female    DOB: 1940-07-22, 79 y.o.   MRN: [id number]   Chief Complaint: medical management of chronic issues     HPI:  1. Essential hypertension No c/o chest pain, sob or headaches. Does not check her blood pressure at home.  BP Readings from Last 3 Encounters:  06/06/20 113/81  03/15/20 (!) 171/80  11/30/19 137/76     2. Hyperlipidemia with target LDL less than 100 Does watch diet but does no dedicated exercise. Lab Results  Component Value Date   CHOL 200 (H) 11/30/2019   HDL 95 11/30/2019   LDLCALC 89 11/30/2019   TRIG 92 11/30/2019   CHOLHDL 2.1 11/30/2019     3. Metabolic syndrome She watches he rdiet but does not check her blood sugars. Lab Results  Component Value Date   HGBA1C 6.2 08/29/2018      4. Atherosclerosis of aorta [facility]) She has not seen her heart doctor in years. Says she feels fine.  5. Stage 3a chronic kidney disease (HCC) Is c/o of some dysuria today. She was treated for a UTI about 3 weeks ago and thinks it is back. Lab Results  Component Value Date   CREATININE 1.07 (H) 11/30/2019       Outpatient Encounter Medications as of 06/06/2020  Medication Sig  . amoxicillin-clavulanate (AUGMENTIN) 875-125 MG tablet Take 1 tablet by mouth 2 (two) times daily.  . Baclofen 5 MG TABS Take 5 mg by mouth 2 (two) times daily as needed.  . diclofenac (VOLTAREN) 75 MG EC tablet Take 1 tablet (75 mg total) by mouth 2 (two) times daily.  . dorzolamide-timolol (COSOPT) 22.3-6.8 MG/ML ophthalmic solution   . lisinopril-hydrochlorothiazide (ZESTORETIC) 20-12.5 MG tablet Take 1 tablet by mouth daily.  Floyde Parkins Z 0.004 % SOLN ophthalmic solution    No facility-administered encounter medications on file as of 06/06/2020.    Past Surgical History:  Procedure Laterality Date  . CERVICAL CONE BIOPSY    . GLAUCOMA SURGERY  12/04 & 07/05/03  . left eye cataract surgery  01/29/2009  . right eye cataract  surgery  01/13/09  . VARICOSE VEIN SURGERY     both legs     Family History  Problem Relation Age of Onset  . Stroke Mother   . Cancer Father   . Cancer Sister   . Diabetes Brother   . Hyperlipidemia Brother   . Hyperlipidemia Brother     New complaints: None today  Social history: Retired- her daughter is in and out  Controlled substance contract: n/a    Review of Systems  Constitutional: Negative for diaphoresis.  Eyes: Negative for pain.  Respiratory: Negative for shortness of breath.   Cardiovascular: Negative for chest pain, palpitations and leg swelling.  Gastrointestinal: Negative for abdominal pain.  Endocrine: Negative for polydipsia.  Skin: Negative for rash.  Neurological: Negative for dizziness, weakness and headaches.  Hematological: Does not bruise/bleed easily.  All other systems reviewed and are negative.      Objective:   Physical Exam Vitals and nursing note reviewed.  Constitutional:      General: She is not in acute distress.    Appearance: Normal appearance. She is well-developed and well-nourished.  HENT:     Head: Normocephalic.     Right Ear: There is no impacted cerumen.     Left Ear: There is impacted cerumen.     Nose: Nose normal.     Mouth/Throat:  Mouth: Oropharynx is clear and moist.  Eyes:     Extraocular Movements: EOM normal.     Pupils: Pupils are equal, round, and reactive to light.  Neck:     Vascular: No carotid bruit or JVD.  Cardiovascular:     Rate and Rhythm: Normal rate and regular rhythm.     Pulses: Intact distal pulses.     Heart sounds: Normal heart sounds.  Pulmonary:     Effort: Pulmonary effort is normal. No respiratory distress.     Breath sounds: Normal breath sounds. No wheezing or rales.  Chest:     Chest wall: No tenderness.  Abdominal:     General: Bowel sounds are normal. There is no distension or abdominal bruit. Aorta is normal.     Palpations: Abdomen is soft. There is no hepatomegaly,  splenomegaly, mass or pulsatile mass.     Tenderness: There is no abdominal tenderness.  Musculoskeletal:        General: Normal range of motion.     Cervical back: Normal range of motion and neck supple.     Right lower leg: Edema (1+) present.     Left lower leg: Edema (1+) present.  Lymphadenopathy:     Cervical: No cervical adenopathy.  Skin:    General: Skin is warm and dry.  Neurological:     Mental Status: She is alert and oriented to person, place, and time.     Deep Tendon Reflexes: Reflexes are normal and symmetric.  Psychiatric:        Mood and Affect: Mood and affect normal.        Behavior: Behavior normal.        Thought Content: Thought content normal.        Judgment: Judgment normal.     BP 113/81   Pulse 75   Temp (!) 96.4 F (35.8 C) (Temporal)   Resp 20   Ht 5\' 7"  (1.702 m)   Wt 148 lb (67.1 kg)   BMI 23.18 kg/m   S/P left ear irrigation- TM clear-Priscilla , FNP      Assessment & Plan:  JASPREET BODNER comes in today with chief complaint of Medical Management of Chronic Issues (Check ears/)   Diagnosis and orders addressed:  1. Essential hypertension Low sodium diet - lisinopril-hydrochlorothiazide (ZESTORETIC) 20-12.5 MG tablet; Take 1 tablet by mouth daily.  Dispense: 90 tablet; Refill: 0  2. Hyperlipidemia with target LDL less than 100 Low fat diet  3. Metabolic syndrome Watch carbs in diet  4. Atherosclerosis of aorta (HCC)  5. Stage 3a chronic kidney disease (HCC) Labs pending   Labs pending Health Maintenance reviewed Diet and exercise encouraged  Follow up plan: 3 months   Priscilla Priscilla Doe, FNP

## 2020-06-07 MED ORDER — DOXYCYCLINE HYCLATE 100 MG PO TABS: 100.0000 mg | ORAL_TABLET | Freq: Two times a day (BID) | ORAL | 0 refills | Status: DC

## 2020-06-07 NOTE — Addendum Note (Signed)
Addended by: Bennie Pierini on: 06/07/2020 07:45 AM   Modules accepted: Orders

## 2020-06-18 DIAGNOSIS — H353111 Nonexudative age-related macular degeneration, right eye, early dry stage: Secondary | ICD-10-CM | POA: Diagnosis not present

## 2020-06-18 DIAGNOSIS — H401131 Primary open-angle glaucoma, bilateral, mild stage: Secondary | ICD-10-CM | POA: Diagnosis not present

## 2020-06-18 DIAGNOSIS — H353223 Exudative age-related macular degeneration, left eye, with inactive scar: Secondary | ICD-10-CM | POA: Diagnosis not present

## 2020-07-16 ENCOUNTER — Other Ambulatory Visit: Payer: Self-pay | Admitting: Family

## 2020-07-16 DIAGNOSIS — M542 Cervicalgia: Secondary | ICD-10-CM

## 2020-07-25 ENCOUNTER — Encounter (INDEPENDENT_AMBULATORY_CARE_PROVIDER_SITE_OTHER): Payer: Medicare Other | Admitting: Ophthalmology

## 2020-07-30 ENCOUNTER — Other Ambulatory Visit: Payer: Self-pay

## 2020-07-30 ENCOUNTER — Ambulatory Visit (INDEPENDENT_AMBULATORY_CARE_PROVIDER_SITE_OTHER): Payer: Medicare Other | Admitting: Ophthalmology

## 2020-07-30 ENCOUNTER — Encounter (INDEPENDENT_AMBULATORY_CARE_PROVIDER_SITE_OTHER): Payer: Self-pay | Admitting: Ophthalmology

## 2020-07-30 ENCOUNTER — Encounter (INDEPENDENT_AMBULATORY_CARE_PROVIDER_SITE_OTHER): Payer: Medicare Other | Admitting: Ophthalmology

## 2020-07-30 DIAGNOSIS — H348122 Central retinal vein occlusion, left eye, stable: Secondary | ICD-10-CM

## 2020-07-30 DIAGNOSIS — H401132 Primary open-angle glaucoma, bilateral, moderate stage: Secondary | ICD-10-CM

## 2020-07-30 DIAGNOSIS — H43813 Vitreous degeneration, bilateral: Secondary | ICD-10-CM | POA: Diagnosis not present

## 2020-07-30 DIAGNOSIS — H353132 Nonexudative age-related macular degeneration, bilateral, intermediate dry stage: Secondary | ICD-10-CM

## 2020-07-30 NOTE — Assessment & Plan Note (Signed)
Minor OU, does not reach the level of requiring Use of vitamin supplementationThe nature of age-related macular degeneration was discussed with the patient as well as the distinction between dry and wet types. Checking an Amsler Grid daily with advice to return immediately should a distortion develop, was given to the patient. The patient 's smoking status now and in the past was determined and advice based on the AREDS study was provided regarding the consumption of antioxidant supplements. AREDS 2 vitamin formulation was recommended. Consumption of dark leafy vegetables and fresh fruits of various colors was recommended. Treatment modalities for wet macular degeneration particularly the use of intravitreal injections of anti-blood vessel growth factors was discussed with the patient. Avastin, Lucentis, and Eylea are the available options. On occasion, therapy includes the use of photodynamic therapy and thermal laser. Stressed to the patient do not rub eyes.  Patient was advised to check Amsler Grid daily and return immediately if changes are noted. Instructions on using the grid were given to the patient. All patient questions were answered.

## 2020-07-30 NOTE — Assessment & Plan Note (Signed)
Some component of optic nerve damage left eye from prior central retinal vein occlusion now compensated and inactive with good acuity

## 2020-07-30 NOTE — Assessment & Plan Note (Signed)
No active disease, chronic atrophy as result of prior CRV O now resolved

## 2020-07-30 NOTE — Assessment & Plan Note (Signed)
Incidental no pathologic significance

## 2020-07-30 NOTE — Progress Notes (Signed)
07/30/2020     CHIEF COMPLAINT Patient presents for Retina Follow Up (6 month CRVO f\u. OCT/Pt states some days vision is more dim than others. Denies new floaters and FOL.)   HISTORY OF PRESENT ILLNESS: Priscilla Daniels is a 80 y.o. female who presents to the clinic today for:   HPI    Retina Follow Up    Patient presents with  CRVO/BRVO.  In left eye.  Severity is moderate.  Duration of 6.  Since onset it is stable.  I, the attending physician,  performed the HPI with the patient and updated documentation appropriately. Additional comments: 6 month CRVO f\u. OCT Pt states some days vision is more dim than others. Denies new floaters and FOL.       Last edited by Elyse Jarvis on 07/30/2020  9:39 AM. (History)      Referring physician: Bennie Pierini, FNP [address] [place],  [place] [address]  HISTORICAL INFORMATION:   Selected notes from the MEDICAL RECORD NUMBER    Lab Results  Component Value Date   HGBA1C 6.3 06/06/2020     CURRENT MEDICATIONS: Current Outpatient Medications (Ophthalmic Drugs)  Medication Sig  . dorzolamide-timolol (COSOPT) 22.3-6.8 MG/ML ophthalmic solution   . TRAVATAN Z 0.004 % SOLN ophthalmic solution    No current facility-administered medications for this visit. (Ophthalmic Drugs)   Current Outpatient Medications (Other)  Medication Sig  . Baclofen 5 MG TABS Take 5 mg by mouth 2 (two) times daily as needed.  . diclofenac (VOLTAREN) 75 MG EC tablet TAKE ONE TABLET BY MOUTH TWICE DAILY  . doxycycline (VIBRA-TABS) 100 MG tablet Take 1 tablet (100 mg total) by mouth 2 (two) times daily. 1 po bid  . lisinopril-hydrochlorothiazide (ZESTORETIC) 20-12.5 MG tablet Take 1 tablet by mouth daily.   No current facility-administered medications for this visit. (Other)      REVIEW OF SYSTEMS:    ALLERGIES Allergies  Allergen Reactions  . Lipitor [Atorvastatin Calcium]   . Septra [Bactrim]   . Sulfa Antibiotics     PAST  MEDICAL HISTORY Past Medical History:  Diagnosis Date  . Abnormal vaginal Pap smear   . Candidiasis    cuteneous   . Chronic UTI   . COPD (chronic obstructive pulmonary disease) (HCC) 2010   Dx by CXR   . DM (diabetes mellitus) (HCC)   . Fatigue   . Glaucoma   . Hemorrhage in optic nerve sheath of left eye 01/23/2020  . Hyperlipidemia    Diet Controlled   . NIDDM (non-insulin dependent diabetes mellitus)   . Phlebitis   . Strep pharyngitis   . Vaginal atrophy   . Vertigo    Past Surgical History:  Procedure Laterality Date  . CERVICAL CONE BIOPSY    . GLAUCOMA SURGERY  12/04 & 07/05/03  . left eye cataract surgery  01/29/2009  . right eye cataract surgery  01/13/09  . VARICOSE VEIN SURGERY     both legs     FAMILY HISTORY Family History  Problem Relation Age of Onset  . Stroke Mother   . Cancer Father   . Cancer Sister   . Diabetes Brother   . Hyperlipidemia Brother   . Hyperlipidemia Brother     SOCIAL HISTORY Social History   Tobacco Use  . Smoking status: Never Smoker  . Smokeless tobacco: Never Used  Vaping Use  . Vaping Use: Never used  Substance Use Topics  . Alcohol use: No  . Drug  use: No         OPHTHALMIC EXAM: Base Eye Exam    Visual Acuity (Snellen - Linear)      Right Left   Dist cc 20/20 20/40   Dist ph cc  20/30 -1   Correction: Glasses       Tonometry (Tonopen, 9:43 AM)      Right Left   Pressure 10 13       Pupils      Pupils Dark Light Shape React APD   Right PERRL 4 3 Round Brisk None   Left PERRL 4 3 Round Brisk None       Visual Fields (Counting fingers)      Left Right    Full Full       Neuro/Psych    Mood/Affect: Normal       Dilation    Both eyes: 1.0% Mydriacyl, 2.5% Phenylephrine @ 9:43 AM        Slit Lamp and Fundus Exam    External Exam      Right Left   External Normal Normal       Slit Lamp Exam      Right Left   Lids/Lashes Normal Normal   Conjunctiva/Sclera White and quiet White and quiet    Cornea Clear Clear   Anterior Chamber Deep and quiet Deep and quiet   Iris Round and reactive Round and reactive   Lens Centered posterior chamber intraocular lens Centered posterior chamber intraocular lens   Anterior Vitreous Normal Normal       Fundus Exam      Right Left   Posterior Vitreous Posterior vitreous detachment Posterior vitreous detachment   Disc Normal Collaterals on the nerve, 1+ pallor   C/D Ratio 0.4 0.8   Macula Drusen, Early age related macular degeneration, no macular thickening Hard drusen, Early age related macular degeneration   Vessels Normal Old compensated CRVO, no active leakages, no hemorrhages   Periphery Normal Normal          IMAGING AND PROCEDURES  Imaging and Procedures for 07/30/20  OCT, Retina - OU - Both Eyes       Right Eye Quality was good. Scan locations included subfoveal. Central Foveal Thickness: 259. Findings include retinal drusen , abnormal foveal contour, no SRF, no IRF.   Left Eye Quality was good. Scan locations included subfoveal. Central Foveal Thickness: 244. Progression has been stable. Findings include abnormal foveal contour, retinal drusen , no SRF, no IRF.   Notes OS with incidental PVD, diffuse regions of atrophy from prior CRVO with secondary ischemia, now inactive                ASSESSMENT/PLAN:  Central retinal vein occlusion of left eye No active disease, chronic atrophy as result of prior CRV O now resolved  Primary open angle glaucoma of both eyes, moderate stage Some component of optic nerve damage left eye from prior central retinal vein occlusion now compensated and inactive with good acuity  Intermediate stage nonexudative age-related macular degeneration of both eyes Minor OU, does not reach the level of requiring Use of vitamin supplementationThe nature of age-related macular degeneration was discussed with the patient as well as the distinction between dry and wet types. Checking an Amsler  Grid daily with advice to return immediately should a distortion develop, was given to the patient. The patient 's smoking status now and in the past was determined and advice based on the AREDS study was provided regarding the  consumption of antioxidant supplements. AREDS 2 vitamin formulation was recommended. Consumption of dark leafy vegetables and fresh fruits of various colors was recommended. Treatment modalities for wet macular degeneration particularly the use of intravitreal injections of anti-blood vessel growth factors was discussed with the patient. Avastin, Lucentis, and Eylea are the available options. On occasion, therapy includes the use of photodynamic therapy and thermal laser. Stressed to the patient do not rub eyes.  Patient was advised to check Amsler Grid daily and return immediately if changes are noted. Instructions on using the grid were given to the patient. All patient questions were answered.  Posterior vitreous detachment, both eyes Incidental no pathologic significance      ICD-10-CM   1. Stable central retinal vein occlusion of left eye  H34.8122 OCT, Retina - OU - Both Eyes  2. Primary open angle glaucoma of both eyes, moderate stage  H40.1132   3. Intermediate stage nonexudative age-related macular degeneration of both eyes  H35.3132   4. Posterior vitreous detachment, both eyes  H43.813     1.  Findings reviewed with patient and family, patient will continue on topical drop therapy for glaucoma, to particularly treat the compromised optic nerve left eye as a consequence of prior ischemic central retinal vein occlusion now compensated and stable  2.  No signs of active maculopathy OU.  3.  Follow-up with Dr. Smitty Cords as scheduled  Ophthalmic Meds Ordered this visit:  No orders of the defined types were placed in this encounter.      Return in about 6 months (around 01/27/2021) for DILATE OU, COLOR FP, OCT.  There are no Patient Instructions on file for  this visit.   Explained the diagnoses, plan, and follow up with the patient and they expressed understanding.  Patient expressed understanding of the importance of proper follow up care.   Alford Highland Cloy Cozzens M.D. Diseases & Surgery of the Retina and Vitreous [facility] 07/30/20     Abbreviations: M myopia (nearsighted); A astigmatism; H hyperopia (farsighted); P presbyopia; Mrx spectacle prescription;  CTL contact lenses; OD right eye; OS left eye; OU both eyes  XT exotropia; ET esotropia; PEK punctate epithelial keratitis; PEE punctate epithelial erosions; DES dry eye syndrome; MGD meibomian gland dysfunction; ATs artificial tears; PFAT's preservative free artificial tears; NSC nuclear sclerotic cataract; PSC posterior subcapsular cataract; ERM epi-retinal membrane; PVD posterior vitreous detachment; RD retinal detachment; DM diabetes mellitus; DR diabetic retinopathy; NPDR non-proliferative diabetic retinopathy; PDR proliferative diabetic retinopathy; CSME clinically significant macular edema; DME diabetic macular edema; dbh dot blot hemorrhages; CWS cotton wool spot; POAG primary open angle glaucoma; C/D cup-to-disc ratio; HVF humphrey visual field; GVF goldmann visual field; OCT optical coherence tomography; IOP intraocular pressure; BRVO Branch retinal vein occlusion; CRVO central retinal vein occlusion; CRAO central retinal artery occlusion; BRAO branch retinal artery occlusion; RT retinal tear; SB scleral buckle; PPV pars plana vitrectomy; VH Vitreous hemorrhage; PRP panretinal laser photocoagulation; IVK intravitreal kenalog; VMT vitreomacular traction; MH Macular hole;  NVD neovascularization of the disc; NVE neovascularization elsewhere; AREDS age related eye disease study; ARMD age related macular degeneration; POAG primary open angle glaucoma; EBMD epithelial/anterior basement membrane dystrophy; ACIOL anterior chamber intraocular lens; IOL intraocular lens; PCIOL posterior  chamber intraocular lens; Phaco/IOL phacoemulsification with intraocular lens placement; PRK photorefractive keratectomy; LASIK laser assisted in situ keratomileusis; HTN hypertension; DM diabetes mellitus; COPD chronic obstructive pulmonary disease

## 2020-08-26 ENCOUNTER — Other Ambulatory Visit: Payer: Self-pay | Admitting: Nurse Practitioner

## 2020-08-26 DIAGNOSIS — I10 Essential (primary) hypertension: Secondary | ICD-10-CM

## 2020-09-06 ENCOUNTER — Ambulatory Visit: Payer: Self-pay | Admitting: Nurse Practitioner

## 2020-09-12 ENCOUNTER — Encounter: Payer: Self-pay | Admitting: Nurse Practitioner

## 2020-09-12 ENCOUNTER — Ambulatory Visit (INDEPENDENT_AMBULATORY_CARE_PROVIDER_SITE_OTHER): Payer: Medicare Other | Admitting: Nurse Practitioner

## 2020-09-12 ENCOUNTER — Other Ambulatory Visit: Payer: Self-pay

## 2020-09-12 VITALS — BP 178/83 | HR 63 | Temp 97.7°F | Resp 20 | Ht 67.0 in | Wt 151.0 lb

## 2020-09-12 DIAGNOSIS — R300 Dysuria: Secondary | ICD-10-CM | POA: Insufficient documentation

## 2020-09-12 DIAGNOSIS — I700 Atherosclerosis of aorta: Secondary | ICD-10-CM | POA: Diagnosis not present

## 2020-09-12 DIAGNOSIS — I10 Essential (primary) hypertension: Secondary | ICD-10-CM | POA: Diagnosis not present

## 2020-09-12 DIAGNOSIS — N1831 Chronic kidney disease, stage 3a: Secondary | ICD-10-CM

## 2020-09-12 DIAGNOSIS — E785 Hyperlipidemia, unspecified: Secondary | ICD-10-CM

## 2020-09-12 DIAGNOSIS — R5383 Other fatigue: Secondary | ICD-10-CM | POA: Diagnosis not present

## 2020-09-12 DIAGNOSIS — E8881 Metabolic syndrome: Secondary | ICD-10-CM | POA: Diagnosis not present

## 2020-09-12 DIAGNOSIS — R7309 Other abnormal glucose: Secondary | ICD-10-CM | POA: Diagnosis not present

## 2020-09-12 LAB — URINALYSIS, COMPLETE
Bilirubin, UA: NEGATIVE
Glucose, UA: NEGATIVE
Ketones, UA: NEGATIVE
Nitrite, UA: POSITIVE — AB
Protein,UA: NEGATIVE
RBC, UA: NEGATIVE
Specific Gravity, UA: 1.02 (ref 1.005–1.030)
Urobilinogen, Ur: 1 mg/dL (ref 0.2–1.0)
pH, UA: 7 (ref 5.0–7.5)

## 2020-09-12 LAB — MICROSCOPIC EXAMINATION

## 2020-09-12 LAB — BAYER DCA HB A1C WAIVED: HB A1C (BAYER DCA - WAIVED): 6 % (ref <7.0)

## 2020-09-12 MED ORDER — LISINOPRIL-HYDROCHLOROTHIAZIDE 20-12.5 MG PO TABS: ORAL_TABLET | ORAL | 1 refills | Status: DC

## 2020-09-12 NOTE — Patient Instructions (Signed)
Fall Prevention in the Home, Adult Falls can cause injuries and can happen to people of all ages. There are many things you can do to make your home safe and to help prevent falls. Ask for help when making these changes. What actions can I take to prevent falls? General Instructions  Use good lighting in all rooms. Replace any light bulbs that burn out.  Turn on the lights in dark areas. Use night-lights.  Keep items that you use often in easy-to-reach places. Lower the shelves around your home if needed.  Set up your furniture so you have a clear path. Avoid moving your furniture around.  Do not have throw rugs or other things on the floor that can make you trip.  Avoid walking on wet floors.  If any of your floors are uneven, fix them.  Add color or contrast paint or tape to clearly mark and help you see: ? Grab bars or handrails. ? First and last steps of staircases. ? Where the edge of each step is.  If you use a stepladder: ? Make sure that it is fully opened. Do not climb a closed stepladder. ? Make sure the sides of the stepladder are locked in place. ? Ask someone to hold the stepladder while you use it.  Know where your pets are when moving through your home. What can I do in the bathroom?  Keep the floor dry. Clean up any water on the floor right away.  Remove soap buildup in the tub or shower.  Use nonskid mats or decals on the floor of the tub or shower.  Attach bath mats securely with double-sided, nonslip rug tape.  If you need to sit down in the shower, use a plastic, nonslip stool.  Install grab bars by the toilet and in the tub and shower. Do not use towel bars as grab bars.      What can I do in the bedroom?  Make sure that you have a light by your bed that is easy to reach.  Do not use any sheets or blankets for your bed that hang to the floor.  Have a firm chair with side arms that you can use for support when you get dressed. What can I do in  the kitchen?  Clean up any spills right away.  If you need to reach something above you, use a step stool with a grab bar.  Keep electrical cords out of the way.  Do not use floor polish or wax that makes floors slippery. What can I do with my stairs?  Do not leave any items on the stairs.  Make sure that you have a light switch at the top and the bottom of the stairs.  Make sure that there are handrails on both sides of the stairs. Fix handrails that are broken or loose.  Install nonslip stair treads on all your stairs.  Avoid having throw rugs at the top or bottom of the stairs.  Choose a carpet that does not hide the edge of the steps on the stairs.  Check carpeting to make sure that it is firmly attached to the stairs. Fix carpet that is loose or worn. What can I do on the outside of my home?  Use bright outdoor lighting.  Fix the edges of walkways and driveways and fix any cracks.  Remove anything that might make you trip as you walk through a door, such as a raised step or threshold.  Trim any   bushes or trees on paths to your home.  Check to see if handrails are loose or broken and that both sides of all steps have handrails.  Install guardrails along the edges of any raised decks and porches.  Clear paths of anything that can make you trip, such as tools or rocks.  Have leaves, snow, or ice cleared regularly.  Use sand or salt on paths during winter.  Clean up any spills in your garage right away. This includes grease or oil spills. What other actions can I take?  Wear shoes that: ? Have a low heel. Do not wear high heels. ? Have rubber bottoms. ? Feel good on your feet and fit well. ? Are closed at the toe. Do not wear open-toe sandals.  Use tools that help you move around if needed. These include: ? Canes. ? Walkers. ? Scooters. ? Crutches.  Review your medicines with your doctor. Some medicines can make you feel dizzy. This can increase your chance  of falling. Ask your doctor what else you can do to help prevent falls. Where to find more information  Centers for Disease Control and Prevention, [employer]: www.cdc.gov  National Institute on Aging: www.nia.nih.gov Contact a doctor if:  You are afraid of falling at home.  You feel weak, drowsy, or dizzy at home.  You fall at home. Summary  There are many simple things that you can do to make your home safe and to help prevent falls.  Ways to make your home safe include removing things that can make you trip and installing grab bars in the bathroom.  Ask for help when making these changes in your home. This information is not intended to replace advice given to you by your health care provider. Make sure you discuss any questions you have with your health care provider. Document Revised: 01/10/2020 Document Reviewed: 01/10/2020 Elsevier Patient Education  2021 [employer] Inc.  

## 2020-09-12 NOTE — Progress Notes (Signed)
Subjective:    Patient ID: Priscilla Daniels, female    DOB: [birth year], 80 y.o.   MRN: [id number]   Chief Complaint: Follow up for chronic disease management    HPI:  1. Primary hypertension Taking lisinopril-HCTZ for BP mgt. Doing well on this. HA in the back of her head. She thinks it may be from a fall a year ago. Some SOB and dizziness. Does not monitor at home.  Did not take BP meds this morning.   BP Readings from Last 3 Encounters:  09/12/20 (!) 178/83  06/06/20 113/81  03/15/20 (!) 171/80    2. Hyperlipidemia with target LDL less than 100 Controls with diet and exercise.   Lab Results  Component Value Date   CHOL 206 (H) 06/06/2020   HDL 87 06/06/2020   LDLCALC 99 06/06/2020   TRIG 115 06/06/2020   CHOLHDL 2.4 06/06/2020    3. Metabolic syndrome Controlling with diet and exercise. Has been having increased fatigued, but has been taking her baclofen bid with or without spasm. She has some c/o cold intolerance, fatigue, hair loss and dry skin,   4. Atherosclerosis of aorta ([place]) Does not follow with cardiology.  Feels ok.   5. Stage 3a chronic kidney disease ([place]) No issues with edema. Some SOB sometimes.   6. Dysuria She has intermittent dysuria, burning on urination. Would like a UA to evaluate.   Lab Results  Component Value Date   CREATININE 1.20 (H) 06/06/2020   Lab Results  Component Value Date   WBC 6.8 06/06/2020   HGB 13.3 06/06/2020   HCT 39.9 06/06/2020   MCV 85 06/06/2020   PLT 266 06/06/2020      Outpatient Encounter Medications as of 09/12/2020  Medication Sig  . Baclofen 5 MG TABS Take 5 mg by mouth 2 (two) times daily as needed.  . diclofenac (VOLTAREN) 75 MG EC tablet TAKE ONE TABLET BY MOUTH TWICE DAILY  . dorzolamide-timolol (COSOPT) 22.3-6.8 MG/ML ophthalmic solution   . doxycycline (VIBRA-TABS) 100 MG tablet Take 1 tablet (100 mg total) by mouth 2 (two) times daily. 1 po bid  . lisinopril-hydrochlorothiazide (ZESTORETIC)  20-12.5 MG tablet TAKE ONE (1) TABLET EACH DAY  . TRAVATAN Z 0.004 % SOLN ophthalmic solution    No facility-administered encounter medications on file as of 09/12/2020.    Past Surgical History:  Procedure Laterality Date  . CERVICAL CONE BIOPSY    . GLAUCOMA SURGERY  12/04 & 07/05/03  . left eye cataract surgery  01/29/2009  . right eye cataract surgery  01/13/09  . VARICOSE VEIN SURGERY     both legs     Family History  Problem Relation Age of Onset  . Stroke Mother   . Cancer Father   . Cancer Sister   . Diabetes Brother   . Hyperlipidemia Brother   . Hyperlipidemia Brother     New complaints: None  Social history: Lives with daughter  Controlled substance contract: N/a     Review of Systems  Constitutional: Positive for fatigue. Negative for chills and fever.  Respiratory: Positive for shortness of breath. Negative for cough and wheezing.   Cardiovascular: Negative for chest pain and palpitations.  Gastrointestinal: Positive for constipation. Negative for abdominal distention and diarrhea.  Genitourinary: Positive for dysuria and frequency. Negative for difficulty urinating.  Musculoskeletal: Positive for neck pain.  Neurological: Positive for dizziness and light-headedness. Negative for tremors.  Psychiatric/Behavioral: Positive for confusion. The patient is not nervous/anxious.  Objective:   Physical Exam Constitutional:      Appearance: Normal appearance.  Neck:     Vascular: No carotid bruit.  Cardiovascular:     Rate and Rhythm: Normal rate and regular rhythm.     Pulses: Normal pulses.     Heart sounds: Normal heart sounds.  Pulmonary:     Effort: Pulmonary effort is normal.     Breath sounds: Normal breath sounds.  Abdominal:     General: Abdomen is flat.     Palpations: Abdomen is soft.     Tenderness: There is no right CVA tenderness or left CVA tenderness.  Musculoskeletal:        General: Normal range of motion.     Cervical back:  Normal range of motion and neck supple.  Skin:    General: Skin is warm and dry.     Capillary Refill: Capillary refill takes less than 2 seconds.  Neurological:     Mental Status: She is alert and oriented to person, place, and time.  Psychiatric:        Mood and Affect: Mood normal.        Behavior: Behavior normal.        Thought Content: Thought content normal.        Judgment: Judgment normal.    Vitals:   09/12/20 1149  BP: (!) 178/83  Pulse: 63  Resp: 20  Temp: 97.7 F (36.5 C)  SpO2: 100%   A1c 6.0% today      Assessment & Plan:   Priscilla Daniels comes in today with chief complaint of Medical Management of Chronic Issues   Diagnosis and orders addressed:  1. Primary hypertension Continue medication as prescribed. Be sure to monitor your BP at home and to take your medications before coming to the office for a visit.   - CBC with Differential/Platelet - CMP14+EGFR - lisinopril-hydrochlorothiazide (ZESTORETIC) 20-12.5 MG tablet; TAKE ONE (1) TABLET EACH DAY  Dispense: 90 tablet; Refill: 1  2. Hyperlipidemia with target LDL less than 100 Continue to control with diet.   - Lipid panel  3. Metabolic syndrome Continue to control with diet.   - Bayer DCA Hb A1c Waived  4. Atherosclerosis of aorta ([place]) Is not interested in following up with cardiology. Controlling lipids with diet and exercise.   5. Stage 3a chronic kidney disease ([facility]) CMP pending.   6. Dysuria UA (+) for leuks (+) for nitrates. Treatment pending culture.   - Urinalysis, Complete - Urine Culture  7. Fatigue, unspecified type Discontinue use of baclofen qd bid. She and her daughter were educated on when appropriate to use PRN. Will check TSH to be sure there is no thyroid involvement.   - Thyroid Panel With TSH   Labs pending Health Maintenance reviewed Diet and exercise encouraged  Follow up plan: 6 months  [name], RN, BSN, FNP-Student  Mary-Margaret Hassell Done,  FNP

## 2020-09-13 LAB — CBC WITH DIFFERENTIAL/PLATELET
Basophils Absolute: 0.1 10*3/uL (ref 0.0–0.2)
Basos: 1 %
EOS (ABSOLUTE): 0.1 10*3/uL (ref 0.0–0.4)
Eos: 2 %
Hematocrit: 36.3 % (ref 34.0–46.6)
Hemoglobin: 12 g/dL (ref 11.1–15.9)
Immature Grans (Abs): 0 10*3/uL (ref 0.0–0.1)
Immature Granulocytes: 0 %
Lymphocytes Absolute: 2 10*3/uL (ref 0.7–3.1)
Lymphs: 30 %
MCH: 28.7 pg (ref 26.6–33.0)
MCHC: 33.1 g/dL (ref 31.5–35.7)
MCV: 87 fL (ref 79–97)
Monocytes Absolute: 0.7 10*3/uL (ref 0.1–0.9)
Monocytes: 10 %
Neutrophils Absolute: 3.8 10*3/uL (ref 1.4–7.0)
Neutrophils: 57 %
Platelets: 252 10*3/uL (ref 150–450)
RBC: 4.18 x10E6/uL (ref 3.77–5.28)
RDW: 13.1 % (ref 11.7–15.4)
WBC: 6.6 10*3/uL (ref 3.4–10.8)

## 2020-09-13 LAB — CMP14+EGFR
ALT: 24 IU/L (ref 0–32)
AST: 21 IU/L (ref 0–40)
Albumin/Globulin Ratio: 1.4 (ref 1.2–2.2)
Albumin: 4.1 g/dL (ref 3.7–4.7)
Alkaline Phosphatase: 66 IU/L (ref 44–121)
BUN/Creatinine Ratio: 21 (ref 12–28)
BUN: 20 mg/dL (ref 8–27)
Bilirubin Total: 0.3 mg/dL (ref 0.0–1.2)
CO2: 24 mmol/L (ref 20–29)
Calcium: 8.9 mg/dL (ref 8.7–10.3)
Chloride: 98 mmol/L (ref 96–106)
Creatinine, Ser: 0.94 mg/dL (ref 0.57–1.00)
Globulin, Total: 2.9 g/dL (ref 1.5–4.5)
Glucose: 103 mg/dL — ABNORMAL HIGH (ref 65–99)
Potassium: 4.9 mmol/L (ref 3.5–5.2)
Sodium: 135 mmol/L (ref 134–144)
Total Protein: 7 g/dL (ref 6.0–8.5)
eGFR: 62 mL/min/{1.73_m2} (ref >59)

## 2020-09-13 LAB — LIPID PANEL
Chol/HDL Ratio: 2.5 ratio (ref 0.0–4.4)
Cholesterol, Total: 223 mg/dL — ABNORMAL HIGH (ref 100–199)
HDL: 89 mg/dL (ref >39)
LDL Chol Calc (NIH): 115 mg/dL — ABNORMAL HIGH (ref 0–99)
Triglycerides: 109 mg/dL (ref 0–149)
VLDL Cholesterol Cal: 19 mg/dL (ref 5–40)

## 2020-09-14 LAB — THYROID PANEL WITH TSH
Free Thyroxine Index: 2.1 (ref 1.2–4.9)
T3 Uptake Ratio: 31 % (ref 24–39)
T4, Total: 6.8 ug/dL (ref 4.5–12.0)
TSH: 2.29 u[IU]/mL (ref 0.450–4.500)

## 2020-09-14 LAB — SPECIMEN STATUS REPORT

## 2020-09-17 LAB — URINE CULTURE

## 2020-09-18 DIAGNOSIS — H401131 Primary open-angle glaucoma, bilateral, mild stage: Secondary | ICD-10-CM | POA: Diagnosis not present

## 2020-09-18 MED ORDER — CEPHALEXIN 500 MG PO CAPS: 500.0000 mg | ORAL_CAPSULE | Freq: Three times a day (TID) | ORAL | 0 refills | Status: DC

## 2020-09-18 NOTE — Addendum Note (Signed)
Addended by: Bennie Pierini on: 09/18/2020 09:29 AM   Modules accepted: Orders

## 2020-09-20 ENCOUNTER — Telehealth: Payer: Self-pay

## 2020-09-20 DIAGNOSIS — E785 Hyperlipidemia, unspecified: Secondary | ICD-10-CM

## 2020-09-20 MED ORDER — ROSUVASTATIN CALCIUM 10 MG PO TABS: 10.0000 mg | ORAL_TABLET | Freq: Every day | ORAL | 1 refills | Status: DC

## 2020-09-20 NOTE — Telephone Encounter (Signed)
Patient aware.

## 2020-09-20 NOTE — Telephone Encounter (Signed)
Please mail copy of labs.

## 2020-09-20 NOTE — Telephone Encounter (Signed)
Pt says she is aware of the urine results but not the bloodwork. Please call back

## 2020-09-20 NOTE — Telephone Encounter (Signed)
Patient is aware of lab results.  Her LDL is elevated and you had recommended she take a statin if she is agreeable.  She is and would like it sent to [facility] in [place].

## 2020-09-20 NOTE — Telephone Encounter (Signed)
Crestor prescription sent to pharmacy

## 2020-09-23 NOTE — Telephone Encounter (Signed)
mailed

## 2020-09-24 ENCOUNTER — Other Ambulatory Visit: Payer: Self-pay | Admitting: Nurse Practitioner

## 2020-09-24 DIAGNOSIS — M542 Cervicalgia: Secondary | ICD-10-CM

## 2020-10-02 ENCOUNTER — Ambulatory Visit (INDEPENDENT_AMBULATORY_CARE_PROVIDER_SITE_OTHER): Payer: Medicare Other

## 2020-10-02 VITALS — Ht 67.0 in | Wt 151.0 lb

## 2020-10-02 DIAGNOSIS — Z0000 Encounter for general adult medical examination without abnormal findings: Secondary | ICD-10-CM

## 2020-10-02 NOTE — Progress Notes (Signed)
Subjective:   Priscilla Daniels is a 80 y.o. female who presents for Medicare Annual (Subsequent) preventive examination.  Virtual Visit via Telephone Note  I connected with  Priscilla Daniels on 10/02/20 at 10:30 AM EDT by telephone and verified that I am speaking with the correct person using two identifiers.  Location: Patient: Home Provider: [facility] Persons participating in the virtual visit: patient, her daughter/Nurse Health Advisor   I discussed the limitations, risks, security and privacy concerns of performing an evaluation and management service by telephone and the availability of in person appointments. The patient expressed understanding and agreed to proceed.  Interactive audio and video telecommunications were attempted between this nurse and patient, however failed, due to patient having technical difficulties OR patient did not have access to video capability.  We continued and completed visit with audio only.  Some vital signs may be absent or patient reported.   Carl Bleecker E Normand Damron, LPN   Review of Systems     Cardiac Risk Factors include: advanced age (>90men, >76 women);sedentary lifestyle;dyslipidemia;hypertension     Objective:    Today's Vitals   10/02/20 1010  Weight: 151 lb (68.5 kg)  Height:  (1.702 m)  PainSc: 3    Body mass index is 23.65 kg/m.  Advanced Directives 10/02/2020 10/02/2019 08/15/2018 02/08/2015  Does Patient Have a Medical Advance Directive? No Yes Yes No  Type of Advance Directive - Living will;Healthcare Power of Attorney Living will -  Does patient want to make changes to medical advance directive? - No - Patient declined No - Patient declined -  Copy of Healthcare Power of Attorney in Chart? - No - copy requested - -  Would patient like information on creating a medical advance directive? Yes (MAU/Ambulatory/Procedural Areas - Information given) - - Yes - Educational materials given    Current Medications (verified) Outpatient  Encounter Medications as of 10/02/2020  Medication Sig  . CRANBERRY PO Take by mouth.  . diclofenac (VOLTAREN) 75 MG EC tablet TAKE ONE TABLET BY MOUTH TWICE DAILY  . dorzolamide (TRUSOPT) 2 % ophthalmic solution 2 (two) times daily.  Marland Kitchen lisinopril-hydrochlorothiazide (ZESTORETIC) 20-12.5 MG tablet TAKE ONE (1) TABLET EACH DAY  . rosuvastatin (CRESTOR) 10 MG tablet Take 1 tablet (10 mg total) by mouth daily.  . TRAVATAN Z 0.004 % SOLN ophthalmic solution   . VITAMIN D PO Take by mouth.  . [DISCONTINUED] dorzolamide-timolol (COSOPT) 22.3-6.8 MG/ML ophthalmic solution   . [DISCONTINUED] Baclofen 5 MG TABS Take 5 mg by mouth 2 (two) times daily as needed. (Patient not taking: Reported on 10/02/2020)  . [DISCONTINUED] cephALEXin (KEFLEX) 500 MG capsule Take 1 capsule (500 mg total) by mouth 3 (three) times daily. (Patient not taking: Reported on 10/02/2020)  . [DISCONTINUED] ipratropium (ATROVENT) 0.06 % nasal spray 2 sprays into each nostril Three (3) times a day. (Patient not taking: Reported on 10/02/2020)   No facility-administered encounter medications on file as of 10/02/2020.    Allergies (verified) Lipitor [atorvastatin calcium], Septra [bactrim], and Sulfa antibiotics   History: Past Medical History:  Diagnosis Date  . Abnormal vaginal Pap smear   . Candidiasis    cuteneous   . Chronic UTI   . COPD (chronic obstructive pulmonary disease) (HCC) 2010   Dx by CXR   . DM (diabetes mellitus) (HCC)   . Fatigue   . Glaucoma   . Hemorrhage in optic nerve sheath of left eye 01/23/2020  . Hyperlipidemia    Diet Controlled   .  NIDDM (non-insulin dependent diabetes mellitus)   . Phlebitis   . Strep pharyngitis   . Vaginal atrophy   . Vertigo    Past Surgical History:  Procedure Laterality Date  . CERVICAL CONE BIOPSY    . GLAUCOMA SURGERY  12/04 & 07/05/03  . left eye cataract surgery  01/29/2009  . right eye cataract surgery  01/13/09  . VARICOSE VEIN SURGERY     both legs    Family  History  Problem Relation Age of Onset  . Stroke Mother   . Cancer Father   . Cancer Sister   . Diabetes Brother   . Hyperlipidemia Brother   . Hyperlipidemia Brother    Social History   Socioeconomic History  . Marital status: Widowed    Spouse name: Not on file  . Number of children: 2  . Years of education: 7  . Highest education level: 7th grade  Occupational History  . Occupation: Retired  Tobacco Use  . Smoking status: Never Smoker  . Smokeless tobacco: Never Used  Vaping Use  . Vaping Use: Never used  Substance and Sexual Activity  . Alcohol use: No  . Drug use: No  . Sexual activity: Not Currently  Other Topics Concern  . Not on file  Social History Narrative   Her daughter lives with her; She has 2 dogs she takes care of.   Social Determinants of Health   Financial Resource Strain: Low Risk   . Difficulty of Paying Living Expenses: Not hard at all  Food Insecurity: No Food Insecurity  . Worried About Programme researcher, broadcasting/film/video in the Last Year: Never true  . Ran Out of Food in the Last Year: Never true  Transportation Needs: No Transportation Needs  . Lack of Transportation (Medical): No  . Lack of Transportation (Non-Medical): No  Physical Activity: Inactive  . Days of Exercise per Week: 0 days  . Minutes of Exercise per Session: 0 min  Stress: No Stress Concern Present  . Feeling of Stress : Not at all  Social Connections: Socially Isolated  . Frequency of Communication with Friends and Family: More than three times a week  . Frequency of Social Gatherings with Friends and Family: Once a week  . Attends Religious Services: Never  . Active Member of Clubs or Organizations: No  . Attends Banker Meetings: Never  . Marital Status: Widowed    Tobacco Counseling Counseling given: No   Clinical Intake:  Pre-visit preparation completed: Yes  Pain : 0-10 Pain Score: 3  Pain Location: Head Pain Onset: More than a month ago Pain Frequency:  Intermittent Effect of Pain on Daily Activities: sometimes wakes her up in the middle of the night - chronic headaches since hitting head over a year ago     BMI - recorded: 23.65 Nutritional Status: BMI of 19-24  Normal Nutritional Risks: None Diabetes: No  How often do you need to have someone help you when you read instructions, pamphlets, or other written materials from your doctor or pharmacy?: 1 - Never  Diabetic? borderline  Interpreter Needed?: No  Information entered by :: Courtez Twaddle, LPN   Activities of Daily Living In your present state of health, do you have any difficulty performing the following activities: 10/02/2020  Hearing? Y  Comment wears hearing aids  Vision? N  Walking or climbing stairs? Y  Dressing or bathing? N  Doing errands, shopping? Y  Preparing Food and eating ? N  Using the Toilet?  N  In the past six months, have you accidently leaked urine? Y  Comment wears luvs pads  Do you have problems with loss of bowel control? N  Managing your Medications? N  Managing your Finances? N  Housekeeping or managing your Housekeeping? N  Some recent data might be hidden    Patient Care Team: Bennie PieriniMartin, Mary-Margaret, FNP as PCP - General (Nurse Practitioner) Smitty CordsMartin, Dustin, OD as Referring Physician (Optometry)  Indicate any recent Medical Services you may have received from other than [facility] providers in the past year (date may be approximate).     Assessment:   This is a routine wellness examination for Ahnika.  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 Exercise 150 min/wk Moderate Activity     10/02/2019 AWV Goal: Exercise for General Health   Patient will verbalize understanding of the benefits of increased physical activity:  Exercising regularly is important. It will improve your overall fitness, flexibility, and endurance.  Regular exercise also will improve your overall health. It can help you control your weight, reduce stress, and improve your bone density.  Over the next year, patient will increase physical activity as tolerated with a goal of at least 150 minutes of moderate physical activity per week.   You can tell that you are exercising at a moderate intensity if your heart starts beating faster and you start breathing faster but can still hold a conversation.  Moderate-intensity exercise ideas include:  Walking 1 mile (1.6 km) in about 15 minutes  Biking  Hiking  Golfing  Dancing  Water aerobics  Patient will verbalize understanding of everyday activities that increase physical activity by providing examples like the following: ? Yard work, such as: ? Pushing a Surveyor, mininglawn mower ? Raking and bagging leaves ? Washing your car ? Pushing a stroller ? Shoveling snow ? Gardening ? Washing windows or floors  Patient will be able to explain general safety guidelines for exercising:   Before you start a new exercise program, talk with your health care provider.  Do not exercise so much that you hurt yourself, feel dizzy, or get very short of breath.  Wear comfortable clothes and wear shoes with good support.  Drink plenty of water while you exercise to prevent dehydration or heat stroke.  Work out until your breathing and your heartbeat get faster.     . Have 3 meals a day     Goals Addressed             This Visit's Progress   . Exercise 150 min/wk Moderate Activity   On track    10/02/2019 AWV Goal: Exercise for General Health   Patient will verbalize  understanding of the benefits of increased physical activity:  Exercising regularly is important. It will improve your overall fitness, flexibility, and endurance.  Regular exercise also will improve your overall health. It can help you control your weight, reduce stress, and improve your bone density.  Over the next year, patient  will increase physical activity as tolerated with a goal of at least 150 minutes of moderate physical activity per week.   You can tell that you are exercising at a moderate intensity if your heart starts beating faster and you start breathing faster but can still hold a conversation.  Moderate-intensity exercise ideas include:  Walking 1 mile (1.6 km) in about 15 minutes  Biking  Hiking  Golfing  Dancing  Water aerobics  Patient will verbalize understanding of everyday activities that increase physical activity by providing examples like the following: ? Yard work, such as: ? Pushing a Surveyor, mining ? Raking and bagging leaves ? Washing your car ? Pushing a stroller ? Shoveling snow ? Gardening ? Washing windows or floors  Patient will be able to explain general safety guidelines for exercising:   Before you start a new exercise program, talk with your health care provider.  Do not exercise so much that you hurt yourself, feel dizzy, or get very short of breath.  Wear comfortable clothes and wear shoes with good support.  Drink plenty of water while you exercise to prevent dehydration or heat stroke.  Work out until your breathing and your heartbeat get faster.     . Have 3 meals a day   On track    10/02/2019 AWV Goal: Improved Nutrition/Diet  . Patient will verbalize understanding that diet plays an important role in overall health and that a poor diet is a risk factor for many chronic medical conditions.  . Over the next year, patient will improve self management of their diet by incorporating better variety, improved meal pattern, more  consistent meal timing, increased physical activity, improved protein intake, and better food choices. . Patient will utilize available community resources to help with food acquisition if needed (ex: food pantries, [address], etc) . Patient will work with nutrition specialist if a referral was made              Depression Screen PHQ 2/9 Scores 10/02/2020 09/12/2020 03/15/2020 11/30/2019 10/02/2019 06/01/2019 03/02/2019  PHQ - 2 Score 0 0 0 0 0 0 0    Fall Risk Fall Risk  10/02/2020 09/12/2020 06/06/2020 03/15/2020 11/30/2019  Falls in the past year? 1 1 1  0 1  Number falls in past yr: 0 0 0 - 0  Injury with Fall? 0 0 0 - 1  Comment - - - - Hit head  Risk for fall due to : History of fall(s);Orthopedic patient;Impaired balance/gait History of fall(s) History of fall(s) - -  Follow up Falls prevention discussed;Education provided Education provided Education provided - -    FALL RISK PREVENTION PERTAINING TO THE HOME:  Any stairs in or around the home? No  If so, are there any without handrails? No  Home free of loose throw rugs in walkways, pet beds, electrical cords, etc? Yes  Adequate lighting in your home to reduce risk of falls? Yes   ASSISTIVE DEVICES UTILIZED TO PREVENT FALLS:  Life alert? No  Use of a cane, walker or w/c? Yes  Grab bars in the bathroom? Yes  Shower chair or bench in shower? Yes  Elevated toilet seat or a handicapped toilet? Yes   TIMED UP AND GO:  Was the test performed? No . Telephonic visit.  Cognitive Function: Normal cognitive status assessed by direct observation by this Nurse Health Advisor. No abnormalities found.   MMSE - Mini Mental State Exam 08/15/2018  Orientation to time 5  Orientation to Place 5  Registration 3  Attention/ Calculation 3  Recall 2  Language- name 2 objects 2  Language- repeat 1  Language- follow 3 step command 3  Language- read & follow direction 1  Write a sentence 1  Copy design 1  Total score 27     6CIT Screen  10/02/2019  What Year? 0 points  What month? 0 points  What time? 0 points  Count back from 20 0 points  Months in reverse 4 points  Repeat phrase 0 points  Total Score 4    Immunizations Immunization History  Administered Date(s) Administered  . Fluad Quad(high Dose 65+) 03/15/2019, 04/02/2020  . Influenza, High Dose Seasonal PF 04/17/2016, 03/31/2018  . Influenza,inj,Quad PF,6+ Mos 03/27/2013, 04/05/2014, 04/19/2015, 03/22/2017  . Moderna Sars-Covid-2 Vaccination 09/13/2019, 10/11/2019, 05/01/2020    TDAP status: Due, Education has been provided regarding the importance of this vaccine. Advised may receive this vaccine at local pharmacy or Health Dept. Aware to provide a copy of the vaccination record if obtained from local pharmacy or Health Dept. Verbalized acceptance and understanding.  Flu Vaccine status: Up to date  Pneumococcal vaccine status: Declined,  Education has been provided regarding the importance of this vaccine but patient still declined. Advised may receive this vaccine at local pharmacy or Health Dept. Aware to provide a copy of the vaccination record if obtained from local pharmacy or Health Dept. Verbalized acceptance and understanding.   Covid-19 vaccine status: Completed vaccines  Qualifies for Shingles Vaccine? Yes   Zostavax completed No   Shingrix Completed?: No.    Education has been provided regarding the importance of this vaccine. Patient has been advised to call insurance company to determine out of pocket expense if they have not yet received this vaccine. Advised may also receive vaccine at local pharmacy or Health Dept. Verbalized acceptance and understanding.  Screening Tests Health Maintenance  Topic Date Due  . OPHTHALMOLOGY EXAM  12/13/2020 (Originally 05/21/2017)  . MAMMOGRAM  06/06/2021 (Originally 04/01/2019)  . DEXA SCAN  06/06/2021 (Originally 10/25/2005)  . TETANUS/TDAP  06/06/2021 (Originally 10/20/2013)  . Hepatitis C Screening   06/06/2021 (Originally May 06, 1941)  . PNA vac Low Risk Adult (1 of 2 - PCV13) 06/06/2021 (Originally 10/25/2005)  . INFLUENZA VACCINE  01/20/2021  . HEMOGLOBIN A1C  03/15/2021  . FOOT EXAM  06/06/2021  . COVID-19 Vaccine  Completed  . HPV VACCINES  Aged Out  . PAP SMEAR-Modifier  Discontinued    Health Maintenance  There are no preventive care reminders to display for this patient.  Colorectal cancer screening: No longer required.   Mammogram status: No longer required due to age.  Declines bone density scan   Lung Cancer Screening: (Low Dose CT Chest recommended if Age 45-80 years, 30 pack-year currently smoking OR have quit w/in 15years.) does not qualify.   Additional Screening:  Hepatitis C Screening: does not qualify  Vision Screening: Recommended annual ophthalmology exams for early detection of glaucoma and other disorders of the eye. Is the patient up to date with their annual eye exam?  Yes  Who is the provider or what is the name of the office in which the patient attends annual eye exams? [facility] in [place] and [facility] in [place] If pt is not established with a provider, would they like to be referred to a provider to establish care? No .   Dental Screening: Recommended annual dental exams for proper oral hygiene  Community Resource Referral / Chronic Care Management: CRR required this visit?  No  CCM required this visit?  No      Plan:     I have personally reviewed and noted the following in the patient's chart:   . Medical and social history . Use of alcohol, tobacco or illicit drugs  . Current medications and supplements . Functional ability and status . Nutritional status . Physical activity . Advanced directives . List of other physicians . Hospitalizations, surgeries, and ER visits in previous 12 months . Vitals . Screenings to include cognitive, depression, and falls . Referrals and appointments  In addition, I have reviewed and discussed  with patient certain preventive protocols, quality metrics, and best practice recommendations. A written personalized care plan for preventive services as well as general preventive health recommendations were provided to patient.     [place] Constable, LPN   01/30/9146   Nurse Notes: None

## 2020-10-02 NOTE — Patient Instructions (Signed)
Priscilla Daniels , Thank you for taking time to come for your Medicare Wellness Visit. I appreciate your ongoing commitment to your health goals. Please review the following plan we discussed and let me know if I can assist you in the future.   Screening recommendations/referrals: Colonoscopy: No longer required Mammogram: No longer required Bone Density: Declined Recommended yearly ophthalmology/optometry visit for glaucoma screening and checkup Recommended yearly dental visit for hygiene and checkup  Vaccinations: Influenza vaccine: Done 04/02/2020 - Repeat annually Pneumococcal vaccine: Declined Tdap vaccine: Declined Shingles vaccine: Declined   Covid-19: Complete: 09/13/19, 10/11/19, & 05/01/2020  Advanced directives: Advance directive discussed with you today. I have provided a copy for you to complete at home and have notarized. Once this is complete please bring a copy in to our office so we can scan it into your chart.  Conditions/risks identified: Continue fall prevention. Try the balance and strength exercises.  Next appointment: Follow up in one year for your annual wellness visit    Preventive Care 65 Years and Older, Female Preventive care refers to lifestyle choices and visits with your health care provider that can promote health and wellness. What does preventive care include?  A yearly physical exam. This is also called an annual well check.  Dental exams once or twice a year.  Routine eye exams. Ask your health care provider how often you should have your eyes checked.  Personal lifestyle choices, including:  Daily care of your teeth and gums.  Regular physical activity.  Eating a healthy diet.  Avoiding tobacco and drug use.  Limiting alcohol use.  Practicing safe sex.  Taking low-dose aspirin every day.  Taking vitamin and mineral supplements as recommended by your health care provider. What happens during an annual well check? The services and  screenings done by your health care provider during your annual well check will depend on your age, overall health, lifestyle risk factors, and family history of disease. Counseling  Your health care provider may ask you questions about your:  Alcohol use.  Tobacco use.  Drug use.  Emotional well-being.  Home and relationship well-being.  Sexual activity.  Eating habits.  History of falls.  Memory and ability to understand (cognition).  Work and work Astronomer.  Reproductive health. Screening  You may have the following tests or measurements:  Height, weight, and BMI.  Blood pressure.  Lipid and cholesterol levels. These may be checked every 5 years, or more frequently if you are over 43 years old.  Skin check.  Lung cancer screening. You may have this screening every year starting at age 26 if you have a 30-pack-year history of smoking and currently smoke or have quit within the past 15 years.  Fecal occult blood test (FOBT) of the stool. You may have this test every year starting at age 52.  Flexible sigmoidoscopy or colonoscopy. You may have a sigmoidoscopy every 5 years or a colonoscopy every 10 years starting at age 64.  Hepatitis C blood test.  Hepatitis B blood test.  Sexually transmitted disease (STD) testing.  Diabetes screening. This is done by checking your blood sugar (glucose) after you have not eaten for a while (fasting). You may have this done every 1-3 years.  Bone density scan. This is done to screen for osteoporosis. You may have this done starting at age 77.  Mammogram. This may be done every 1-2 years. Talk to your health care provider about how often you should have regular mammograms. Talk with your health  care provider about your test results, treatment options, and if necessary, the need for more tests. Vaccines  Your health care provider may recommend certain vaccines, such as:  Influenza vaccine. This is recommended every  year.  Tetanus, diphtheria, and acellular pertussis (Tdap, Td) vaccine. You may need a Td booster every 10 years.  Zoster vaccine. You may need this after age 44.  Pneumococcal 13-valent conjugate (PCV13) vaccine. One dose is recommended after age 4.  Pneumococcal polysaccharide (PPSV23) vaccine. One dose is recommended after age 52. Talk to your health care provider about which screenings and vaccines you need and how often you need them. This information is not intended to replace advice given to you by your health care provider. Make sure you discuss any questions you have with your health care provider. Document Released: 07/05/2015 Document Revised: 02/26/2016 Document Reviewed: 04/09/2015 Elsevier Interactive Patient Education  2017 [employer] Prevention in the Home Falls can cause injuries. They can happen to people of all ages. There are many things you can do to make your home safe and to help prevent falls. What can I do on the outside of my home?  Regularly fix the edges of walkways and driveways and fix any cracks.  Remove anything that might make you trip as you walk through a door, such as a raised step or threshold.  Trim any bushes or trees on the path to your home.  Use bright outdoor lighting.  Clear any walking paths of anything that might make someone trip, such as rocks or tools.  Regularly check to see if handrails are loose or broken. Make sure that both sides of any steps have handrails.  Any raised decks and porches should have guardrails on the edges.  Have any leaves, snow, or ice cleared regularly.  Use sand or salt on walking paths during winter.  Clean up any spills in your garage right away. This includes oil or grease spills. What can I do in the bathroom?  Use night lights.  Install grab bars by the toilet and in the tub and shower. Do not use towel bars as grab bars.  Use non-skid mats or decals in the tub or shower.  If you  need to sit down in the shower, use a plastic, non-slip stool.  Keep the floor dry. Clean up any water that spills on the floor as soon as it happens.  Remove soap buildup in the tub or shower regularly.  Attach bath mats securely with double-sided non-slip rug tape.  Do not have throw rugs and other things on the floor that can make you trip. What can I do in the bedroom?  Use night lights.  Make sure that you have a light by your bed that is easy to reach.  Do not use any sheets or blankets that are too big for your bed. They should not hang down onto the floor.  Have a firm chair that has side arms. You can use this for support while you get dressed.  Do not have throw rugs and other things on the floor that can make you trip. What can I do in the kitchen?  Clean up any spills right away.  Avoid walking on wet floors.  Keep items that you use a lot in easy-to-reach places.  If you need to reach something above you, use a strong step stool that has a grab bar.  Keep electrical cords out of the way.  Do not use floor  polish or wax that makes floors slippery. If you must use wax, use non-skid floor wax.  Do not have throw rugs and other things on the floor that can make you trip. What can I do with my stairs?  Do not leave any items on the stairs.  Make sure that there are handrails on both sides of the stairs and use them. Fix handrails that are broken or loose. Make sure that handrails are as long as the stairways.  Check any carpeting to make sure that it is firmly attached to the stairs. Fix any carpet that is loose or worn.  Avoid having throw rugs at the top or bottom of the stairs. If you do have throw rugs, attach them to the floor with carpet tape.  Make sure that you have a light switch at the top of the stairs and the bottom of the stairs. If you do not have them, ask someone to add them for you. What else can I do to help prevent falls?  Wear shoes  that:  Do not have high heels.  Have rubber bottoms.  Are comfortable and fit you well.  Are closed at the toe. Do not wear sandals.  If you use a stepladder:  Make sure that it is fully opened. Do not climb a closed stepladder.  Make sure that both sides of the stepladder are locked into place.  Ask someone to hold it for you, if possible.  Clearly mark and make sure that you can see:  Any grab bars or handrails.  First and last steps.  Where the edge of each step is.  Use tools that help you move around (mobility aids) if they are needed. These include:  Canes.  Walkers.  Scooters.  Crutches.  Turn on the lights when you go into a dark area. Replace any light bulbs as soon as they burn out.  Set up your furniture so you have a clear path. Avoid moving your furniture around.  If any of your floors are uneven, fix them.  If there are any pets around you, be aware of where they are.  Review your medicines with your doctor. Some medicines can make you feel dizzy. This can increase your chance of falling. Ask your doctor what other things that you can do to help prevent falls. This information is not intended to replace advice given to you by your health care provider. Make sure you discuss any questions you have with your health care provider. Document Released: 04/04/2009 Document Revised: 11/14/2015 Document Reviewed: 07/13/2014 Elsevier Interactive Patient Education  2017 [employer].

## 2020-12-04 ENCOUNTER — Encounter: Payer: Self-pay | Admitting: Family Medicine

## 2020-12-04 ENCOUNTER — Ambulatory Visit (INDEPENDENT_AMBULATORY_CARE_PROVIDER_SITE_OTHER): Payer: Medicare Other | Admitting: Family Medicine

## 2020-12-04 ENCOUNTER — Other Ambulatory Visit: Payer: Self-pay

## 2020-12-04 VITALS — BP 174/80 | HR 68 | Temp 97.4°F | Ht 67.0 in | Wt 146.4 lb

## 2020-12-04 DIAGNOSIS — N3000 Acute cystitis without hematuria: Secondary | ICD-10-CM

## 2020-12-04 DIAGNOSIS — R300 Dysuria: Secondary | ICD-10-CM | POA: Diagnosis not present

## 2020-12-04 LAB — URINALYSIS, ROUTINE W REFLEX MICROSCOPIC
Bilirubin, UA: NEGATIVE
Glucose, UA: NEGATIVE
Nitrite, UA: POSITIVE — AB
Protein,UA: NEGATIVE
Specific Gravity, UA: 1.02 (ref 1.005–1.030)
Urobilinogen, Ur: 1 mg/dL (ref 0.2–1.0)
pH, UA: 6.5 (ref 5.0–7.5)

## 2020-12-04 LAB — MICROSCOPIC EXAMINATION
Epithelial Cells (non renal): NONE SEEN /hpf (ref 0–10)
WBC, UA: >30 /hpf — AB (ref 0–5)

## 2020-12-04 MED ORDER — CEPHALEXIN 500 MG PO CAPS: 500.0000 mg | ORAL_CAPSULE | Freq: Two times a day (BID) | ORAL | 0 refills | Status: AC

## 2020-12-04 NOTE — Progress Notes (Signed)
Acute Office Visit  Subjective:    Patient ID: Priscilla Daniels, female    DOB: 01/28/1941, 80 y.o.   MRN: [id number]  Chief Complaint  Patient presents with   Dysuria    HPI Patient is in today for dysuria for 2 weeks. She also reports frequency, and urgency. Denies abdominal pain, fever, chills, or flank pain. She has a history of recurrent UTIs.   Past Medical History:  Diagnosis Date   Abnormal vaginal Pap smear    Candidiasis    cuteneous    Chronic UTI    COPD (chronic obstructive pulmonary disease) ([place]) 2010   Dx by CXR    DM (diabetes mellitus) ([place])    Fatigue    Glaucoma    Hemorrhage in optic nerve sheath of left eye 01/23/2020   Hyperlipidemia    Diet Controlled    NIDDM (non-insulin dependent diabetes mellitus)    Phlebitis    Strep pharyngitis    Vaginal atrophy    Vertigo     Past Surgical History:  Procedure Laterality Date   CERVICAL CONE BIOPSY     GLAUCOMA SURGERY  12/04 & 07/05/03   left eye cataract surgery  01/29/2009   right eye cataract surgery  01/13/09   VARICOSE VEIN SURGERY     both legs     Family History  Problem Relation Age of Onset   Stroke Mother    Cancer Father    Cancer Sister    Diabetes Brother    Hyperlipidemia Brother    Hyperlipidemia Brother     Social History   Socioeconomic History   Marital status: Widowed    Spouse name: Not on file   Number of children: 2   Years of education: 7   Highest education level: 7th grade  Occupational History   Occupation: Retired  Tobacco Use   Smoking status: Never   Smokeless tobacco: Never  Vaping Use   Vaping Use: Never used  Substance and Sexual Activity   Alcohol use: No   Drug use: No   Sexual activity: Not Currently  Other Topics Concern   Not on file  Social History Narrative   Her daughter lives with her; She has 2 dogs she takes care of.   Social Determinants of Health   Financial Resource Strain: Low Risk    Difficulty of Paying Living Expenses: Not  hard at all  Food Insecurity: No Food Insecurity   Worried About Charity fundraiser in the Last Year: Never true   [place] in the Last Year: Never true  Transportation Needs: No Transportation Needs   Lack of Transportation (Medical): No   Lack of Transportation (Non-Medical): No  Physical Activity: Inactive   Days of Exercise per Week: 0 days   Minutes of Exercise per Session: 0 min  Stress: No Stress Concern Present   Feeling of Stress : Not at all  Social Connections: Socially Isolated   Frequency of Communication with Friends and Family: More than three times a week   Frequency of Social Gatherings with Friends and Family: Once a week   Attends Religious Services: Never   Marine scientist or Organizations: No   Attends Archivist Meetings: Never   Marital Status: Widowed  Human resources officer Violence: Not At Risk   Fear of Current or Ex-Partner: No   Emotionally Abused: No   Physically Abused: No   Sexually Abused: No    Outpatient Medications Prior to  Visit  Medication Sig Dispense Refill   CRANBERRY PO Take by mouth.     diclofenac (VOLTAREN) 75 MG EC tablet TAKE ONE TABLET BY MOUTH TWICE DAILY 60 tablet 2   dorzolamide (TRUSOPT) 2 % ophthalmic solution 2 (two) times daily.     lisinopril-hydrochlorothiazide (ZESTORETIC) 20-12.5 MG tablet TAKE ONE (1) TABLET EACH DAY 90 tablet 1   rosuvastatin (CRESTOR) 10 MG tablet Take 1 tablet (10 mg total) by mouth daily. 90 tablet 1   TRAVATAN Z 0.004 % SOLN ophthalmic solution      VITAMIN D PO Take by mouth.     No facility-administered medications prior to visit.    Allergies  Allergen Reactions   Lipitor [Atorvastatin Calcium]    Septra [Bactrim]    Sulfa Antibiotics     Review of Systems As per HPI.     Objective:    Physical Exam Vitals and nursing note reviewed.  Constitutional:      General: She is not in acute distress.    Appearance: She is not ill-appearing, toxic-appearing or  diaphoretic.  Cardiovascular:     Rate and Rhythm: Normal rate and regular rhythm.     Heart sounds: Normal heart sounds. No murmur heard. Pulmonary:     Effort: Pulmonary effort is normal. No respiratory distress.     Breath sounds: Normal breath sounds.  Abdominal:     General: Bowel sounds are normal. There is no distension.     Palpations: Abdomen is soft.     Tenderness: There is no abdominal tenderness. There is no right CVA tenderness, left CVA tenderness, guarding or rebound.  Neurological:     Mental Status: She is alert and oriented to person, place, and time.  Psychiatric:        Mood and Affect: Mood normal.        Behavior: Behavior normal.   Urine dipstick shows positive for RBC's, positive for nitrates, positive for leukocytes, and positive for ketones.  Micro exam: >30 WBC's per HPF, 0-2 RBC's per HPF, and many+ bacteria.  BP (!) 174/80   Pulse 68   Temp (!) 97.4 F (36.3 C) (Oral)   Ht 5' 7" (1.702 m)   Wt 146 lb 6 oz (66.4 kg)   BMI 22.93 kg/m  Wt Readings from Last 3 Encounters:  12/04/20 146 lb 6 oz (66.4 kg)  10/02/20 151 lb (68.5 kg)  09/12/20 151 lb (68.5 kg)    Health Maintenance Due  Topic Date Due   Zoster Vaccines- Shingrix (1 of 2) Never done   COVID-19 Vaccine (4 - Booster for Moderna series) 08/29/2020    There are no preventive care reminders to display for this patient.   Lab Results  Component Value Date   TSH 2.290 09/12/2020   Lab Results  Component Value Date   WBC 6.6 09/12/2020   HGB 12.0 09/12/2020   HCT 36.3 09/12/2020   MCV 87 09/12/2020   PLT 252 09/12/2020   Lab Results  Component Value Date   NA 135 09/12/2020   K 4.9 09/12/2020   CO2 24 09/12/2020   GLUCOSE 103 (H) 09/12/2020   BUN 20 09/12/2020   CREATININE 0.94 09/12/2020   BILITOT 0.3 09/12/2020   ALKPHOS 66 09/12/2020   AST 21 09/12/2020   ALT 24 09/12/2020   PROT 7.0 09/12/2020   ALBUMIN 4.1 09/12/2020   CALCIUM 8.9 09/12/2020   EGFR 62 09/12/2020    Lab Results  Component Value Date   CHOL 223 (  H) 09/12/2020   Lab Results  Component Value Date   HDL 89 09/12/2020   Lab Results  Component Value Date   LDLCALC 115 (H) 09/12/2020   Lab Results  Component Value Date   TRIG 109 09/12/2020   Lab Results  Component Value Date   CHOLHDL 2.5 09/12/2020   Lab Results  Component Value Date   HGBA1C 6.0 09/12/2020       Assessment & Plan:   Satine was seen today for dysuria.  Diagnoses and all orders for this visit:  Acute cystitis without hematuria Keflex as below. Culture pending.  -     Urinalysis, Routine w reflex microscopic -     cephALEXin (KEFLEX) 500 MG capsule; Take 1 capsule (500 mg total) by mouth 2 (two) times daily for 10 days. -     Urine Culture -     Microscopic Examination   Return to office for new or worsening symptoms, or if symptoms persist.   The patient indicates understanding of these issues and agrees with the plan.   Gwenlyn Perking, FNP

## 2020-12-04 NOTE — Patient Instructions (Signed)
Urinary Tract Infection, Adult A urinary tract infection (UTI) is an infection of any part of the urinary tract. The urinary tract includes the kidneys, ureters, bladder, and urethra. These organs make, store, and get rid of urine in the body. An upper UTI affects the ureters and kidneys. A lower UTI affects the bladder and urethra. What are the causes? Most urinary tract infections are caused by bacteria in your genital area around your urethra, where urine leaves your body. These bacteria grow and cause inflammation of your urinary tract. What increases the risk? You are more likely to develop this condition if: You have a urinary catheter that stays in place. You are not able to control when you urinate or have a bowel movement (incontinence). You are female and you: Use a spermicide or diaphragm for birth control. Have low estrogen levels. Are pregnant. You have certain genes that increase your risk. You are sexually active. You take antibiotic medicines. You have a condition that causes your flow of urine to slow down, such as: An enlarged prostate, if you are female. Blockage in your urethra. A kidney stone. A nerve condition that affects your bladder control (neurogenic bladder). Not getting enough to drink, or not urinating often. You have certain medical conditions, such as: Diabetes. A weak disease-fighting system (immunesystem). Sickle cell disease. Gout. Spinal cord injury. What are the signs or symptoms? Symptoms of this condition include: Needing to urinate right away (urgency). Frequent urination. This may include small amounts of urine each time you urinate. Pain or burning with urination. Blood in the urine. Urine that smells bad or unusual. Trouble urinating. Cloudy urine. Vaginal discharge, if you are female. Pain in the abdomen or the lower back. You may also have: Vomiting or a decreased appetite. Confusion. Irritability or tiredness. A fever or  chills. Diarrhea. The first symptom in older adults may be confusion. In some cases, they may not have any symptoms until the infection has worsened. How is this diagnosed? This condition is diagnosed based on your medical history and a physical exam. You may also have other tests, including: Urine tests. Blood tests. Tests for STIs (sexually transmitted infections). If you have had more than one UTI, a cystoscopy or imaging studies may be done to determine the cause of the infections. How is this treated? Treatment for this condition includes: Antibiotic medicine. Over-the-counter medicines to treat discomfort. Drinking enough water to stay hydrated. If you have frequent infections or have other conditions such as a kidney stone, you may need to see a health care provider who specializes in the urinary tract (urologist). In rare cases, urinary tract infections can cause sepsis. Sepsis is a life-threatening condition that occurs when the body responds to an infection. Sepsis is treated in the hospital with IV antibiotics, fluids, and other medicines. Follow these instructions at home: Medicines Take over-the-counter and prescription medicines only as told by your health care provider. If you were prescribed an antibiotic medicine, take it as told by your health care provider. Do not stop using the antibiotic even if you start to feel better. General instructions Make sure you: Empty your bladder often and completely. Do not hold urine for long periods of time. Empty your bladder after sex. Wipe from front to back after urinating or having a bowel movement if you are female. Use each tissue only one time when you wipe. Drink enough fluid to keep your urine pale yellow. Keep all follow-up visits. This is important. Contact a health care provider   if: Your symptoms do not get better after 1-2 days. Your symptoms go away and then return. Get help right away if: You have severe pain in your  back or your lower abdomen. You have a fever or chills. You have nausea or vomiting. Summary A urinary tract infection (UTI) is an infection of any part of the urinary tract, which includes the kidneys, ureters, bladder, and urethra. Most urinary tract infections are caused by bacteria in your genital area. Treatment for this condition often includes antibiotic medicines. If you were prescribed an antibiotic medicine, take it as told by your health care provider. Do not stop using the antibiotic even if you start to feel better. Keep all follow-up visits. This is important. This information is not intended to replace advice given to you by your health care provider. Make sure you discuss any questions you have with your health care provider. Document Revised: 01/19/2020 Document Reviewed: 01/19/2020 Elsevier Patient Education  2022 [employer]  

## 2020-12-05 ENCOUNTER — Other Ambulatory Visit: Payer: Self-pay | Admitting: Nurse Practitioner

## 2020-12-05 DIAGNOSIS — M542 Cervicalgia: Secondary | ICD-10-CM

## 2020-12-08 LAB — URINE CULTURE

## 2020-12-26 ENCOUNTER — Ambulatory Visit (INDEPENDENT_AMBULATORY_CARE_PROVIDER_SITE_OTHER): Payer: Medicare Other | Admitting: Nurse Practitioner

## 2020-12-26 ENCOUNTER — Encounter: Payer: Self-pay | Admitting: Nurse Practitioner

## 2020-12-26 ENCOUNTER — Other Ambulatory Visit: Payer: Self-pay

## 2020-12-26 VITALS — BP 132/82 | HR 62 | Temp 97.6°F | Resp 20

## 2020-12-26 DIAGNOSIS — I83813 Varicose veins of bilateral lower extremities with pain: Secondary | ICD-10-CM | POA: Diagnosis not present

## 2020-12-26 DIAGNOSIS — M2550 Pain in unspecified joint: Secondary | ICD-10-CM

## 2020-12-26 MED ORDER — MELOXICAM 15 MG PO TABS: 15.0000 mg | ORAL_TABLET | Freq: Every day | ORAL | 0 refills | Status: DC

## 2020-12-26 NOTE — Patient Instructions (Signed)
Arthritis Arthritis means joint pain. It can also mean joint disease. A joint is a place where bones come together. There are more than 100 types of arthritis. What are the causes? This condition may be caused by: Wear and tear of a joint. This is the most common cause. A lot of acid in the blood, which leads to pain in the joint (gout). Pain and swelling (inflammation) in a joint. Infection of a joint. Injuries in the joint. A reaction to medicines (allergy). In some cases, the cause may not be known. What are the signs or symptoms? Symptoms of this condition include: Redness at a joint. Swelling at a joint. Stiffness at a joint. Warmth coming from the joint. A fever. A feeling of being sick. How is this treated? This condition may be treated with: Treating the cause, if it is known. Rest. Raising (elevating) the joint. Putting cold or hot packs on the joint. Medicines to treat symptoms and reduce pain and swelling. Shots of medicines (cortisone) into the joint. You may also be told to make changes in your life, such as doing exercises and losing weight. Follow these instructions at home: Medicines Take over-the-counter and prescription medicines only as told by your doctor. Do not take aspirin for pain if your doctor says that you may have gout. Activity Rest your joint if your doctor tells you to. Avoid activities that make the pain worse. Exercise your joint regularly as told by your doctor. Try doing exercises like: Swimming. Water aerobics. Biking. Walking. Managing pain, stiffness, and swelling   If told, put ice on the affected area. Put ice in a plastic bag. Place a towel between your skin and the bag. Leave the ice on for 20 minutes, 2-3 times per day. If your joint is swollen, raise (elevate) it above the level of your heart if told by your doctor. If your joint feels stiff in the morning, try taking a warm shower. If told, put heat on the affected area. Do  this as often as told by your doctor. Use the heat source that your doctor recommends, such as a moist heat pack or a heating pad. If you have diabetes, do not apply heat without asking your doctor. To apply heat: Place a towel between your skin and the heat source. Leave the heat on for 20-30 minutes. Remove the heat if your skin turns bright red. This is very important if you are unable to feel pain, heat, or cold. You may have a greater risk of getting burned. General instructions Do not use any products that contain nicotine or tobacco, such as cigarettes, e-cigarettes, and chewing tobacco. If you need help quitting, ask your doctor. Keep all follow-up visits as told by your doctor. This is important. Contact a doctor if: The pain gets worse. You have a fever. Get help right away if: You have very bad pain in your joint. You have swelling in your joint. Your joint is red. Many joints become painful and swollen. You have very bad back pain. Your leg is very weak. You cannot control your pee (urine) or poop (stool). Summary Arthritis means joint pain. It can also mean joint disease. A joint is a place where bones come together. The most common cause of this condition is wear and tear of a joint. Symptoms of this condition include redness, swelling, or stiffness of the joint. This condition is treated with rest, raising the joint, medicines, and putting cold or hot packs on the joint. Follow your   doctor's instructions about medicines, activity, exercises, and other home care treatments. This information is not intended to replace advice given to you by your health care provider. Make sure you discuss any questions you have with your health care provider. Document Revised: 05/16/2018 Document Reviewed: 05/16/2018 Elsevier Patient Education  2022 Elsevier Inc.  

## 2020-12-26 NOTE — Progress Notes (Addendum)
   Subjective:    Patient ID: Priscilla Daniels, female    DOB: 07-31-1940, 80 y.o.   MRN: [id number]   Chief Complaint: Pain all over (Unable to walk/)   HPI Patient is brought in by hr mom today c/o all over body pain. She is having trouble walking due to the pain. She says her legs feel heavy and hard to pick her feet up. She has to walk with a walker to get around but lately she cant  get around at all. Sometimes she feels like her legs are going to give away with her.    Review of Systems  Respiratory: Negative.    Cardiovascular: Negative.   Genitourinary: Negative.   Musculoskeletal:  Positive for arthralgias (all over).  Neurological: Negative.   Psychiatric/Behavioral: Negative.    All other systems reviewed and are negative.     Objective:   Physical Exam Vitals and nursing note reviewed.  Cardiovascular:     Rate and Rhythm: Normal rate and regular rhythm.     Heart sounds: Normal heart sounds.     Comments: Multiple varicose veins  Pulmonary:     Breath sounds: Normal breath sounds.  Musculoskeletal:     Cervical back: Normal range of motion.     Comments: In wheel chair- unable to get on exam table  Skin:    General: Skin is warm.  Neurological:     General: No focal deficit present.  Psychiatric:        Mood and Affect: Mood normal.        Behavior: Behavior normal.    BP 132/82   Pulse 62   Temp 97.6 F (36.4 C) (Temporal)   Resp 20   SpO2 96%         Assessment & Plan:   Priscilla Daniels in today with chief complaint of Pain all over (Unable to walk/)   1. Arthralgia, unspecified joint Started mobic 15mg  daily Moist heat to joints - Arthritis Panel  2. Varicose veins of both lower extremities with pain Wear compression socks    The above assessment and management plan was discussed with the patient. The patient verbalized understanding of and has agreed to the management plan. Patient is aware to call the clinic if symptoms persist  or worsen. Patient is aware when to return to the clinic for a follow-up visit. Patient educated on when it is appropriate to go to the emergency department.   Mary-Margaret , FNP

## 2020-12-27 ENCOUNTER — Other Ambulatory Visit: Payer: Medicare Other

## 2020-12-27 DIAGNOSIS — M2550 Pain in unspecified joint: Secondary | ICD-10-CM | POA: Diagnosis not present

## 2020-12-28 LAB — ARTHRITIS PANEL
Basophils Absolute: 0 10*3/uL (ref 0.0–0.2)
Basos: 0 %
EOS (ABSOLUTE): 0 10*3/uL (ref 0.0–0.4)
Eos: 1 %
Hematocrit: 35.2 % (ref 34.0–46.6)
Hemoglobin: 11.7 g/dL (ref 11.1–15.9)
Immature Grans (Abs): 0 10*3/uL (ref 0.0–0.1)
Immature Granulocytes: 0 %
Lymphocytes Absolute: 2.2 10*3/uL (ref 0.7–3.1)
Lymphs: 37 %
MCH: 28.6 pg (ref 26.6–33.0)
MCHC: 33.2 g/dL (ref 31.5–35.7)
MCV: 86 fL (ref 79–97)
Monocytes Absolute: 0.7 10*3/uL (ref 0.1–0.9)
Monocytes: 12 %
Neutrophils Absolute: 3 10*3/uL (ref 1.4–7.0)
Neutrophils: 50 %
Platelets: 228 10*3/uL (ref 150–450)
RBC: 4.09 x10E6/uL (ref 3.77–5.28)
RDW: 12.4 % (ref 11.7–15.4)
Rheumatoid fact SerPl-aCnc: <10 IU/mL (ref <14.0)
Sed Rate: 14 mm/hr (ref 0–40)
Uric Acid: 4.1 mg/dL (ref 3.1–7.9)
WBC: 6 10*3/uL (ref 3.4–10.8)

## 2021-01-03 ENCOUNTER — Other Ambulatory Visit: Payer: Self-pay | Admitting: Nurse Practitioner

## 2021-01-03 DIAGNOSIS — E785 Hyperlipidemia, unspecified: Secondary | ICD-10-CM

## 2021-01-07 ENCOUNTER — Other Ambulatory Visit: Payer: Self-pay

## 2021-01-07 ENCOUNTER — Telehealth: Payer: Self-pay | Admitting: Nurse Practitioner

## 2021-01-07 DIAGNOSIS — M2550 Pain in unspecified joint: Secondary | ICD-10-CM

## 2021-01-07 NOTE — Telephone Encounter (Signed)
Lease order PT for patient. She should be able to go next door. You can call and ask her to make sure.

## 2021-01-07 NOTE — Telephone Encounter (Signed)
Please review and advise.

## 2021-01-07 NOTE — Telephone Encounter (Signed)
The only thing they can do is strip the veins. Does she want to have surgery on them?

## 2021-01-07 NOTE — Telephone Encounter (Signed)
Order for PT placed for the specialty office next door. Daughter also wants to know if you want to refer to the Vein Specialist on [facility] in [place] due to the veins in her legs?

## 2021-01-07 NOTE — Telephone Encounter (Signed)
Daughter notified and they want to work on the PT first and see the vein specialist at a later time.

## 2021-01-07 NOTE — Telephone Encounter (Signed)
Pts daughter called wanting to know if PT was still going to be scheduled for pt since her arthritis panel came back normal because they have not heard anything from anyone.  Please advise and call daughter [name])

## 2021-01-22 ENCOUNTER — Other Ambulatory Visit: Payer: Self-pay

## 2021-01-22 ENCOUNTER — Encounter: Payer: Self-pay | Admitting: Physical Therapy

## 2021-01-22 ENCOUNTER — Ambulatory Visit: Payer: Medicare Other | Attending: Nurse Practitioner | Admitting: Physical Therapy

## 2021-01-22 DIAGNOSIS — R2681 Unsteadiness on feet: Secondary | ICD-10-CM | POA: Insufficient documentation

## 2021-01-22 DIAGNOSIS — M6281 Muscle weakness (generalized): Secondary | ICD-10-CM | POA: Insufficient documentation

## 2021-01-22 NOTE — Therapy (Signed)
[facility] [address] [place], [place], [address] Phone: [phone]   Fax:  [phone]  Physical Therapy Evaluation  Patient Details  Name: Priscilla Daniels MRN: [id number] Date of Birth: 1941/01/30 Referring Provider (PT): Paulene Floor   Encounter Date: 01/22/2021   PT End of Session - 01/22/21 1505     Visit Number 1    Number of Visits 8    Date for PT Re-Evaluation 02/26/21    PT Start Time 0102    PT Stop Time 0133    PT Time Calculation (min) 31 min    Activity Tolerance Patient tolerated treatment well    Behavior During Therapy [facility] for tasks assessed/performed             Past Medical History:  Diagnosis Date   Abnormal vaginal Pap smear    Candidiasis    cuteneous    Chronic UTI    COPD (chronic obstructive pulmonary disease) (HCC) 2010   Dx by CXR    DM (diabetes mellitus) (HCC)    Fatigue    Glaucoma    Hemorrhage in optic nerve sheath of left eye 01/23/2020   Hyperlipidemia    Diet Controlled    NIDDM (non-insulin dependent diabetes mellitus)    Phlebitis    Strep pharyngitis    Vaginal atrophy    Vertigo     Past Surgical History:  Procedure Laterality Date   CERVICAL CONE BIOPSY     GLAUCOMA SURGERY  12/04 & 07/05/03   left eye cataract surgery  01/29/2009   right eye cataract surgery  01/13/09   VARICOSE VEIN SURGERY     both legs     There were no vitals filed for this visit.    Subjective Assessment - 01/22/21 1508     Subjective COVID-19 screen performed prior to patient entering clinic.  The patient presents to the clinic today with daughter and granddaughter.  The patient reports a feeling of weakness and heaviness in her legs.  The patient had a fall in February of last year in which she hit the back of her head on a door frame.  Family members feels that this was the onset of her declining function.  They are wanting a Neurologist consult and possible CT scan.  Patient would like to walk  and get around better.    Pertinent History COPD, DM, scoliosis.    How long can you walk comfortably? Around home with a FWW.    Diagnostic tests X-ray.    Patient Stated Goals Walk and get around better.  Feel good, cook, stand better and drive.                [facility] PT Assessment - 01/22/21 0001       Assessment   Medical Diagnosis Arthralgia    Referring Provider (PT) Paulene Floor    Onset Date/Surgical Date --   Ongoing.     Precautions   Precaution Comments HIGH FALL RISK.      Restrictions   Weight Bearing Restrictions No      Balance Screen   Has the patient fallen in the past 6 months Yes    How many times? 2.    Has the patient had a decrease in activity level because of a fear of falling?  Yes    Is the patient reluctant to leave their home because of a fear of falling?  Yes      Home Environment   Living  Environment Private residence    Additional Comments Her and her daughter reside together.      Prior Function   Level of Independence Requires assistive device for independence    Vocation Retired      IT consultant   Overall Cognitive Status Within Functional Limits for tasks assessed      Posture/Postural Control   Posture/Postural Control Postural limitations    Postural Limitations Rounded Shoulders;Forward head;Decreased lumbar lordosis    Posture Comments Scoliosis.      Deep Tendon Reflexes   DTR Assessment Site Patella;Achilles    Patella DTR 2+    Achilles DTR 0      ROM / Strength   AROM / PROM / Strength AROM;Strength      AROM   Overall AROM Comments Assessed LE ROM in supine which is WNL.      Strength   Overall Strength Comments Right hip flexion is 3-/5, abduction is 3/5, right knee strength is a solid 4/5.  Left hip flexion is 3+/5, left knee strength is 4 to 4+/5.      Special Tests   Other special tests (+) Romberg test.      Bed Mobility   Bed Mobility Supine to Sit;Sit to Supine    Supine to Sit Minimal Assistance -  Patient > 75%    Sit to Supine Minimal Assistance - Patient > 75%      Transfers   Transfers Sit to Stand    Sit to Stand 4: Min assist      Ambulation/Gait   Ambulation/Gait Yes    Ambulation/Gait Assistance 4: Min guard    Assistive device Rolling walker    Gait Pattern Decreased step length - right;Decreased step length - left;Decreased stride length;Decreased hip/knee flexion - right;Decreased hip/knee flexion - left   Step-to gait pattern.                       Objective measurements completed on examination: See above findings.                    PT Long Term Goals - 01/22/21 1528       PT LONG TERM GOAL #1   Title Independent with a HEP.    Time 4    Period Weeks    Status New      PT LONG TERM GOAL #2   Title Increase bilateral hip strength by a solid muscle strength grade to increase stability for functional tasks.    Time 4    Period Weeks    Status New      PT LONG TERM GOAL #3   Title Perform 4 stairs with one railing with a reciprocating fashion.    Time 4    Period Weeks    Status New      PT LONG TERM GOAL #4   Title Walk 250 feet in clinic in a non step-to fashion.    Time 4    Period Weeks    Status New                    Plan - 01/22/21 1524     Clinical Impression Statement The patient presents to OPPT with two family members present.  She has c/o LE heaviness and weakness.  Bilateral hips are quite weak and she requires assistance with transfers.  She is using a FWW for ambulation and demonstrates multiple deviations.  She demonstrated a positive Romberg  test and is fearful of falling. The family would like a follow-up Neurologic consult and possible CT scan if deemed necessary.Patient will benefit from skilled physical therapy intervention to address deficits.    Personal Factors and Comorbidities Comorbidity 1;Comorbidity 2;Other    Comorbidities COPD, DM, scoliosis.    Examination-Activity Limitations  Other;Transfers;Locomotion Level;Stairs    Examination-Participation Restrictions Other    Stability/Clinical Decision Making Evolving/Moderate complexity    Clinical Decision Making Moderate    Rehab Potential Good    PT Frequency 2x / week    PT Duration 4 weeks    PT Treatment/Interventions ADLs/Self Care Home Management;Gait training;Stair training;Functional mobility training;Therapeutic activities;Therapeutic exercise;Balance training;Neuromuscular re-education;Manual techniques;Patient/family education    PT Next Visit Plan LE ther ex, gait and balance training.    Consulted and Agree with Plan of Care Patient             Patient will benefit from skilled therapeutic intervention in order to improve the following deficits and impairments:  Abnormal gait, Difficulty walking, Decreased activity tolerance, Decreased balance, Decreased mobility, Decreased strength  Visit Diagnosis: Muscle weakness (generalized) - Plan: PT plan of care cert/re-cert  Unsteadiness on feet - Plan: PT plan of care cert/re-cert     Problem List Patient Active Problem List   Diagnosis Date Noted   Dysuria 09/12/2020   Fatigue 09/12/2020   Posterior vitreous detachment, both eyes 07/30/2020   Central retinal vein occlusion of left eye 01/23/2020   Intermediate stage nonexudative age-related macular degeneration of both eyes 01/23/2020   Primary open angle glaucoma of both eyes, moderate stage 01/23/2020   Retinal hemorrhage of left eye 01/23/2020   Metabolic syndrome 02/21/2016   CKD (chronic kidney disease), stage III (HCC) 02/11/2015   Atherosclerosis of aorta (HCC) 02/08/2015   Glaucoma (increased eye pressure) 09/30/2012   Hyperlipidemia with target LDL less than 100 09/30/2012   Essential hypertension 09/30/2012    Mariann Palo, [place] MPT 01/22/2021, 3:32 PM  [facility] [address] [place], [place], [address] Phone: [phone]   Fax:   [phone]  Name: FREDDIE NGHIEM MRN: [id number] Date of Birth: [birth year]

## 2021-01-27 ENCOUNTER — Other Ambulatory Visit: Payer: Self-pay

## 2021-01-27 ENCOUNTER — Ambulatory Visit: Payer: Medicare Other | Admitting: Physical Therapy

## 2021-01-27 ENCOUNTER — Encounter: Payer: Self-pay | Admitting: Physical Therapy

## 2021-01-27 DIAGNOSIS — R2681 Unsteadiness on feet: Secondary | ICD-10-CM | POA: Diagnosis not present

## 2021-01-27 DIAGNOSIS — M6281 Muscle weakness (generalized): Secondary | ICD-10-CM

## 2021-01-27 NOTE — Therapy (Signed)
[facility] [address] [place], [place], [address] Phone: [phone]   Fax:  [phone]  Physical Therapy Treatment  Patient Details  Name: Priscilla Daniels MRN: [id number] Date of Birth: [birth year] Referring Provider (PT): Paulene Floor   Encounter Date: 01/27/2021   PT End of Session - 01/27/21 1556     Visit Number 2    Number of Visits 8    Date for PT Re-Evaluation 02/26/21    PT Start Time 0145    PT Stop Time 0215    PT Time Calculation (min) 30 min    Activity Tolerance Patient tolerated treatment well    Behavior During Therapy [facility] for tasks assessed/performed             Past Medical History:  Diagnosis Date   Abnormal vaginal Pap smear    Candidiasis    cuteneous    Chronic UTI    COPD (chronic obstructive pulmonary disease) (HCC) 2010   Dx by CXR    DM (diabetes mellitus) (HCC)    Fatigue    Glaucoma    Hemorrhage in optic nerve sheath of left eye 01/23/2020   Hyperlipidemia    Diet Controlled    NIDDM (non-insulin dependent diabetes mellitus)    Phlebitis    Strep pharyngitis    Vaginal atrophy    Vertigo     Past Surgical History:  Procedure Laterality Date   CERVICAL CONE BIOPSY     GLAUCOMA SURGERY  12/04 & 07/05/03   left eye cataract surgery  01/29/2009   right eye cataract surgery  01/13/09   VARICOSE VEIN SURGERY     both legs     There were no vitals filed for this visit.   Subjective Assessment - 01/27/21 1557     Subjective COVID-19 screen performed prior to patient entering clinic.  No new complaints.    Pertinent History COPD, DM, scoliosis.    How long can you walk comfortably? Around home with a FWW.    Diagnostic tests X-ray.    Patient Stated Goals Walk and get around better.  Feel good, cook, stand better and drive.                               [facility] Adult PT Treatment/Exercise - 01/27/21 0001       Exercises   Exercises Knee/Hip      Knee/Hip  Exercises: Aerobic   Nustep Level 1 x 15 minutes.      Knee/Hip Exercises: Supine   Short Arc Quad Sets Limitations 3# (bilateral) x 3 minutes.    Other Supine Knee/Hip Exercises Bilateral hip abduction with yellow theraband x 3 minutes.    Other Supine Knee/Hip Exercises Mini-hip bridges x 2 minutes.                         PT Long Term Goals - 01/22/21 1528       PT LONG TERM GOAL #1   Title Independent with a HEP.    Time 4    Period Weeks    Status New      PT LONG TERM GOAL #2   Title Increase bilateral hip strength by a solid muscle strength grade to increase stability for functional tasks.    Time 4    Period Weeks    Status New      PT LONG TERM GOAL #3  Title Perform 4 stairs with one railing with a reciprocating fashion.    Time 4    Period Weeks    Status New      PT LONG TERM GOAL #4   Title Walk 250 feet in clinic in a non step-to fashion.    Time 4    Period Weeks    Status New                   Plan - 01/27/21 1559     Clinical Impression Statement Patient did well with ther ex.  Her garnddaughter was present.  Provided patient with therband to begin the hip abduction exercises we did today which can be done in supine or seated.    Personal Factors and Comorbidities Comorbidity 1;Comorbidity 2;Other    Comorbidities COPD, DM, scoliosis.    Examination-Activity Limitations Other;Transfers;Locomotion Level;Stairs    Examination-Participation Restrictions Other    Stability/Clinical Decision Making Evolving/Moderate complexity    Rehab Potential Good    PT Frequency 2x / week    PT Treatment/Interventions ADLs/Self Care Home Management;Gait training;Stair training;Functional mobility training;Therapeutic activities;Therapeutic exercise;Balance training;Neuromuscular re-education;Manual techniques;Patient/family education    PT Next Visit Plan LE ther ex, gait and balance training.    Consulted and Agree with Plan of Care Patient              Patient will benefit from skilled therapeutic intervention in order to improve the following deficits and impairments:  Abnormal gait, Difficulty walking, Decreased activity tolerance, Decreased balance, Decreased mobility, Decreased strength  Visit Diagnosis: Muscle weakness (generalized)  Unsteadiness on feet     Problem List Patient Active Problem List   Diagnosis Date Noted   Dysuria 09/12/2020   Fatigue 09/12/2020   Posterior vitreous detachment, both eyes 07/30/2020   Central retinal vein occlusion of left eye 01/23/2020   Intermediate stage nonexudative age-related macular degeneration of both eyes 01/23/2020   Primary open angle glaucoma of both eyes, moderate stage 01/23/2020   Retinal hemorrhage of left eye 01/23/2020   Metabolic syndrome 02/21/2016   CKD (chronic kidney disease), stage III (HCC) 02/11/2015   Atherosclerosis of aorta (HCC) 02/08/2015   Glaucoma (increased eye pressure) 09/30/2012   Hyperlipidemia with target LDL less than 100 09/30/2012   Essential hypertension 09/30/2012    Monterrius Cardosa, [place] MPT 01/27/2021, 4:02 PM  [facility] [address] [place], [place], [address] Phone: [phone]   Fax:  [phone]  Name: Priscilla Daniels MRN: [id number] Date of Birth: 03/10/1941

## 2021-01-28 ENCOUNTER — Encounter (INDEPENDENT_AMBULATORY_CARE_PROVIDER_SITE_OTHER): Payer: Self-pay | Admitting: Ophthalmology

## 2021-01-28 ENCOUNTER — Ambulatory Visit (INDEPENDENT_AMBULATORY_CARE_PROVIDER_SITE_OTHER): Payer: Medicare Other | Admitting: Ophthalmology

## 2021-01-28 DIAGNOSIS — H348122 Central retinal vein occlusion, left eye, stable: Secondary | ICD-10-CM

## 2021-01-28 DIAGNOSIS — H353132 Nonexudative age-related macular degeneration, bilateral, intermediate dry stage: Secondary | ICD-10-CM | POA: Diagnosis not present

## 2021-01-28 DIAGNOSIS — H401132 Primary open-angle glaucoma, bilateral, moderate stage: Secondary | ICD-10-CM

## 2021-01-28 NOTE — Assessment & Plan Note (Signed)
Follow with Dr. Smitty Cords as scheduled

## 2021-01-28 NOTE — Progress Notes (Signed)
01/28/2021     CHIEF COMPLAINT Patient presents for Retina Follow Up (6 month fu ou and oct fp/Pt states, "I am not seeing as well out of my OS. I can tell that it is getting more blurred on that side. My eyes also get very tired easily."/Pt reports using Travaprost  QHS OU and Dorzolamide BID OU///)   HISTORY OF PRESENT ILLNESS: Priscilla Daniels is a 80 y.o. female who presents to the clinic today for:   HPI     Retina Follow Up           Diagnosis: CRVO/BRVO   Laterality: left eye   Onset: 6 months ago   Severity: mild   Duration: 6 months   Course: gradually worsening   Comments: 6 month fu ou and oct fp Pt states, "I am not seeing as well out of my OS. I can tell that it is getting more blurred on that side. My eyes also get very tired easily." Pt reports using Travaprost  QHS OU and Dorzolamide BID OU          Last edited by Demetrios Loll, COA on 01/28/2021  9:41 AM.      Referring physician: Smitty Cords, OD [address] [place],  [place] [address]  HISTORICAL INFORMATION:   Selected notes from the MEDICAL RECORD NUMBER    Lab Results  Component Value Date   HGBA1C 6.0 09/12/2020     CURRENT MEDICATIONS: Current Outpatient Medications (Ophthalmic Drugs)  Medication Sig   dorzolamide (TRUSOPT) 2 % ophthalmic solution 2 (two) times daily.   TRAVATAN Z 0.004 % SOLN ophthalmic solution    No current facility-administered medications for this visit. (Ophthalmic Drugs)   Current Outpatient Medications (Other)  Medication Sig   baclofen (LIORESAL) 10 MG tablet Take 10 mg by mouth 3 (three) times daily.   CRANBERRY PO Take by mouth.   diclofenac (VOLTAREN) 75 MG EC tablet TAKE ONE TABLET BY MOUTH TWICE DAILY   lisinopril-hydrochlorothiazide (ZESTORETIC) 20-12.5 MG tablet TAKE ONE (1) TABLET EACH DAY   meloxicam (MOBIC) 15 MG tablet Take 1 tablet (15 mg total) by mouth daily.   rosuvastatin (CRESTOR) 10 MG tablet TAKE ONE (1) TABLET BY MOUTH EVERY DAY    VITAMIN D PO Take by mouth.   No current facility-administered medications for this visit. (Other)      REVIEW OF SYSTEMS:    ALLERGIES Allergies  Allergen Reactions   Lipitor [Atorvastatin Calcium]    Septra [Bactrim]    Sulfa Antibiotics     PAST MEDICAL HISTORY Past Medical History:  Diagnosis Date   Abnormal vaginal Pap smear    Candidiasis    cuteneous    Chronic UTI    COPD (chronic obstructive pulmonary disease) (HCC) 2010   Dx by CXR    DM (diabetes mellitus) (HCC)    Fatigue    Glaucoma    Hemorrhage in optic nerve sheath of left eye 01/23/2020   Hyperlipidemia    Diet Controlled    NIDDM (non-insulin dependent diabetes mellitus)    Phlebitis    Strep pharyngitis    Vaginal atrophy    Vertigo    Past Surgical History:  Procedure Laterality Date   CERVICAL CONE BIOPSY     GLAUCOMA SURGERY  12/04 & 07/05/03   left eye cataract surgery  01/29/2009   right eye cataract surgery  01/13/09   VARICOSE VEIN SURGERY     both legs  FAMILY HISTORY Family History  Problem Relation Age of Onset   Stroke Mother    Cancer Father    Cancer Sister    Diabetes Brother    Hyperlipidemia Brother    Hyperlipidemia Brother     SOCIAL HISTORY Social History   Tobacco Use   Smoking status: Never   Smokeless tobacco: Never  Vaping Use   Vaping Use: Never used  Substance Use Topics   Alcohol use: No   Drug use: No         OPHTHALMIC EXAM:  Base Eye Exam     Visual Acuity (ETDRS)       Right Left   Dist cc 20/20 -2 20/50   Dist ph cc  NI    Correction: Glasses         Tonometry (Tonopen, 9:43 AM)       Right Left   Pressure 17 19         Pupils       Pupils Dark Light Shape React APD   Right PERRL 4 3 Round Brisk None   Left PERRL 4 3 Round Brisk None         Visual Fields       Left Right   Restrictions Partial outer inferior temporal, inferior nasal deficiencies          Extraocular Movement       Right Left    Full  Full         Neuro/Psych     Oriented x3: Yes   Mood/Affect: Normal         Dilation     Both eyes: 1.0% Mydriacyl, 2.5% Phenylephrine @ 9:43 AM           Slit Lamp and Fundus Exam     External Exam       Right Left   External Normal Normal         Slit Lamp Exam       Right Left   Lids/Lashes Normal Normal   Conjunctiva/Sclera White and quiet White and quiet   Cornea Clear Clear   Anterior Chamber Deep and quiet Deep and quiet   Iris Round and reactive Round and reactive   Lens Centered posterior chamber intraocular lens Centered posterior chamber intraocular lens   Anterior Vitreous Normal Normal         Fundus Exam       Right Left   Posterior Vitreous Posterior vitreous detachment Posterior vitreous detachment   Disc Normal Collaterals on the nerve, 1+ pallor   C/D Ratio 0.4 0.8   Macula Drusen, Early age related macular degeneration, no macular thickening Hard drusen, Early age related macular degeneration   Vessels Normal Old compensated CRVO, no active leakages, no hemorrhages   Periphery Normal Normal            IMAGING AND PROCEDURES  Imaging and Procedures for 01/28/21  OCT, Retina - OU - Both Eyes       Right Eye Quality was good. Scan locations included subfoveal. Central Foveal Thickness: 254. Findings include retinal drusen , abnormal foveal contour, no SRF, no IRF.   Left Eye Quality was good. Scan locations included subfoveal. Central Foveal Thickness: 239. Progression has been stable. Findings include abnormal foveal contour, retinal drusen , no SRF, no IRF.   Notes OS with incidental PVD, diffuse regions of atrophy from prior CRVO with secondary ischemia, now inactive     Color Fundus Photography Optos - OU -  Both Eyes       Right Eye Progression has been stable. Disc findings include normal observations. Macula : normal observations. Vessels : normal observations. Periphery : normal observations.   Left  Eye Progression has been stable. Disc findings include increased cup to disc ratio, pallor.   Notes Old collaterals on the nerve and large cup-to-disc, old retinal vein occlusion stable no CME OS             ASSESSMENT/PLAN:  Central retinal vein occlusion of left eye OS, no signs of recurrence CME, stable disorder, collateralized nerve root  Intermediate stage nonexudative age-related macular degeneration of both eyes Minor OU not high risk  Primary open angle glaucoma of both eyes, moderate stage Follow with Dr. Smitty Cords as scheduled     ICD-10-CM   1. Stable central retinal vein occlusion of left eye  H34.8122 OCT, Retina - OU - Both Eyes    Color Fundus Photography Optos - OU - Both Eyes    2. Intermediate stage nonexudative age-related macular degeneration of both eyes  H35.3132     3. Primary open angle glaucoma of both eyes, moderate stage  H40.1132       1.  OU looks great, no signs of recurrence of CME from CRV O OS stable  2.  Minor ARMD OU, no high risk features  3.  Follow-up with Dr. Smitty Cords as scheduled  Ophthalmic Meds Ordered this visit:  No orders of the defined types were placed in this encounter.      Return in about 1 year (around 01/28/2022) for DILATE OU, COLOR FP, OCT.  There are no Patient Instructions on file for this visit.   Explained the diagnoses, plan, and follow up with the patient and they expressed understanding.  Patient expressed understanding of the importance of proper follow up care.   Priscilla Daniels M.D. Diseases & Surgery of the Retina and Vitreous [facility] 01/28/21     Abbreviations: M myopia (nearsighted); A astigmatism; H hyperopia (farsighted); P presbyopia; Mrx spectacle prescription;  CTL contact lenses; OD right eye; OS left eye; OU both eyes  XT exotropia; ET esotropia; PEK punctate epithelial keratitis; PEE punctate epithelial erosions; DES dry eye syndrome; MGD meibomian gland  dysfunction; ATs artificial tears; PFAT's preservative free artificial tears; NSC nuclear sclerotic cataract; PSC posterior subcapsular cataract; ERM epi-retinal membrane; PVD posterior vitreous detachment; RD retinal detachment; DM diabetes mellitus; DR diabetic retinopathy; NPDR non-proliferative diabetic retinopathy; PDR proliferative diabetic retinopathy; CSME clinically significant macular edema; DME diabetic macular edema; dbh dot blot hemorrhages; CWS cotton wool spot; POAG primary open angle glaucoma; C/D cup-to-disc ratio; HVF humphrey visual field; GVF goldmann visual field; OCT optical coherence tomography; IOP intraocular pressure; BRVO Branch retinal vein occlusion; CRVO central retinal vein occlusion; CRAO central retinal artery occlusion; BRAO branch retinal artery occlusion; RT retinal tear; SB scleral buckle; PPV pars plana vitrectomy; VH Vitreous hemorrhage; PRP panretinal laser photocoagulation; IVK intravitreal kenalog; VMT vitreomacular traction; MH Macular hole;  NVD neovascularization of the disc; NVE neovascularization elsewhere; AREDS age related eye disease study; ARMD age related macular degeneration; POAG primary open angle glaucoma; EBMD epithelial/anterior basement membrane dystrophy; ACIOL anterior chamber intraocular lens; IOL intraocular lens; PCIOL posterior chamber intraocular lens; Phaco/IOL phacoemulsification with intraocular lens placement; PRK photorefractive keratectomy; LASIK laser assisted in situ keratomileusis; HTN hypertension; DM diabetes mellitus; COPD chronic obstructive pulmonary disease

## 2021-01-28 NOTE — Assessment & Plan Note (Signed)
OS, no signs of recurrence CME, stable disorder, collateralized nerve root

## 2021-01-28 NOTE — Assessment & Plan Note (Signed)
Minor OU not high risk

## 2021-01-29 ENCOUNTER — Other Ambulatory Visit: Payer: Self-pay

## 2021-01-29 ENCOUNTER — Ambulatory Visit: Payer: Medicare Other | Admitting: Physical Therapy

## 2021-01-29 DIAGNOSIS — M6281 Muscle weakness (generalized): Secondary | ICD-10-CM

## 2021-01-29 DIAGNOSIS — R2681 Unsteadiness on feet: Secondary | ICD-10-CM | POA: Diagnosis not present

## 2021-01-29 NOTE — Therapy (Signed)
[facility] [address] [place], [place], [address] Phone: [phone]   Fax:  [phone]  Physical Therapy Treatment  Patient Details  Name: Priscilla Daniels MRN: [id number] Date of Birth: 1940-10-13 Referring Provider (PT): Paulene Floor   Encounter Date: 01/29/2021   PT End of Session - 01/29/21 1305     Visit Number 3    Number of Visits 8    Date for PT Re-Evaluation 02/26/21    PT Start Time 0100    PT Stop Time 0143    PT Time Calculation (min) 43 min    Activity Tolerance Patient tolerated treatment well    Behavior During Therapy [facility] for tasks assessed/performed             Past Medical History:  Diagnosis Date   Abnormal vaginal Pap smear    Candidiasis    cuteneous    Chronic UTI    COPD (chronic obstructive pulmonary disease) (HCC) 2010   Dx by CXR    DM (diabetes mellitus) (HCC)    Fatigue    Glaucoma    Hemorrhage in optic nerve sheath of left eye 01/23/2020   Hyperlipidemia    Diet Controlled    NIDDM (non-insulin dependent diabetes mellitus)    Phlebitis    Strep pharyngitis    Vaginal atrophy    Vertigo     Past Surgical History:  Procedure Laterality Date   CERVICAL CONE BIOPSY     GLAUCOMA SURGERY  12/04 & 07/05/03   left eye cataract surgery  01/29/2009   right eye cataract surgery  01/13/09   VARICOSE VEIN SURGERY     both legs     There were no vitals filed for this visit.   Subjective Assessment - 01/29/21 1254     Subjective COVID-19 screen performed prior to patient entering clinic. Patient and daughter arrived. Patient reported doing well after last treatment.    Patient is accompained by: Family member    Pertinent History COPD, DM, scoliosis.    How long can you walk comfortably? Around home with a FWW.    Diagnostic tests X-ray.    Patient Stated Goals Walk and get around better.  Feel good, cook, stand better and drive.    Currently in Pain? No/denies                                [facility] Adult PT Treatment/Exercise - 01/29/21 0001       Knee/Hip Exercises: Aerobic   Nustep L2 x36min UE/LE SPM20-30    Other Aerobic .      Knee/Hip Exercises: Standing   Heel Raises Both;1 set;10 reps      Knee/Hip Exercises: Seated   Long Arc Quad Strengthening;Both;2 sets;10 reps;Weights    Long Arc Quad Weight 2 lbs.    Clamshell with TheraBand Yellow   x20   Other Seated Knee/Hip Exercises core sets with red swiss ball x10    Marching Strengthening;Both;2 sets;10 reps    Marching Limitations yellow band    Hamstring Curl Strengthening;Both;15 reps    Hamstring Limitations yellow band    Sit to Sand 10 reps;with UE support                    PT Education - 01/29/21 1330     Education Details inital HEP    Person(s) Educated Patient    Methods Explanation;Demonstration;Handout    Comprehension  Verbalized understanding;Returned demonstration                 PT Long Term Goals - 01/29/21 1255       PT LONG TERM GOAL #1   Title Independent with a HEP.    Time 4    Period Weeks    Status On-going      PT LONG TERM GOAL #2   Title Increase bilateral hip strength by a solid muscle strength grade to increase stability for functional tasks.    Time 4    Period Weeks    Status On-going      PT LONG TERM GOAL #3   Title Perform 4 stairs with one railing with a reciprocating fashion.    Time 4    Period Weeks    Status On-going      PT LONG TERM GOAL #4   Title Walk 250 feet in clinic in a non step-to fashion.    Time 4    Period Weeks    Status On-going                   Plan - 01/29/21 1345     Clinical Impression Statement Patient tolerated treatment well today. Patient and daughter given HEP for home progression. Patient had difficulty with heel lifts today and only able to perform small lift. Patient very motivated to be able to perfrom ADL's with greater ease. Patient has feer of falling  and limited with standing activity. Patient current goals ongoing due to liitations.    Personal Factors and Comorbidities Comorbidity 1;Comorbidity 2;Other    Comorbidities COPD, DM, scoliosis.    Examination-Activity Limitations Other;Transfers;Locomotion Level;Stairs    Examination-Participation Restrictions Other    Stability/Clinical Decision Making Evolving/Moderate complexity    Rehab Potential Good    PT Frequency 2x / week    PT Duration 4 weeks    PT Treatment/Interventions ADLs/Self Care Home Management;Gait training;Stair training;Functional mobility training;Therapeutic activities;Therapeutic exercise;Balance training;Neuromuscular re-education;Manual techniques;Patient/family education    PT Next Visit Plan cont with LE ther ex, gait and balance training. Transition sitting to standing and supine as tolerated    Consulted and Agree with Plan of Care Patient             Patient will benefit from skilled therapeutic intervention in order to improve the following deficits and impairments:  Abnormal gait, Difficulty walking, Decreased activity tolerance, Decreased balance, Decreased mobility, Decreased strength  Visit Diagnosis: Muscle weakness (generalized)  Unsteadiness on feet     Problem List Patient Active Problem List   Diagnosis Date Noted   Dysuria 09/12/2020   Fatigue 09/12/2020   Posterior vitreous detachment, both eyes 07/30/2020   Central retinal vein occlusion of left eye 01/23/2020   Intermediate stage nonexudative age-related macular degeneration of both eyes 01/23/2020   Primary open angle glaucoma of both eyes, moderate stage 01/23/2020   Retinal hemorrhage of left eye 01/23/2020   Metabolic syndrome 02/21/2016   CKD (chronic kidney disease), stage III (HCC) 02/11/2015   Atherosclerosis of aorta (HCC) 02/08/2015   Glaucoma (increased eye pressure) 09/30/2012   Hyperlipidemia with target LDL less than 100 09/30/2012   Essential hypertension  09/30/2012    Priscilla Daniels P, PTA 01/29/2021, 1:52 PM  [facility] [address] [place], [place], [address] Phone: [phone]   Fax:  [phone]  Name: Priscilla Daniels MRN: [id number] Date of Birth: Mar 11, 1941

## 2021-01-29 NOTE — Patient Instructions (Signed)
  Lower Body: Toe Rise   Standing, place feet apart. Hold arms out for balance or use support. Rise up on toes. Hold _3___ seconds, then lower. Repeat immediately. Repeat __10__ times. Do __2__ sessions per day.   Half Squat to Chair   Stand with feet shoulder width apart. Push buttocks backward and lower slowly, sitting in chair lightly and returning to standing position. Complete _2_ sets of 10_ repetitions. Perform __2-3_ sessions per day.     Hip abduction   While sitting with good posture, tie theraband around knees and pull apart. Slowly resume starting position. x30 1-2 x day      SEATED MARCHING - ELASTIC BAND  Start by sitting in a chair with an elastic band wrapped around your lower thighs.  Next, move a knee upward, set it back down and then alternate to the other side. Perform 10-20 1-2x daily    

## 2021-02-03 ENCOUNTER — Ambulatory Visit: Payer: Medicare Other | Admitting: Physical Therapy

## 2021-02-03 ENCOUNTER — Encounter: Payer: Self-pay | Admitting: Physical Therapy

## 2021-02-03 DIAGNOSIS — R2681 Unsteadiness on feet: Secondary | ICD-10-CM | POA: Diagnosis not present

## 2021-02-03 DIAGNOSIS — M6281 Muscle weakness (generalized): Secondary | ICD-10-CM | POA: Diagnosis not present

## 2021-02-03 NOTE — Therapy (Signed)
[facility] [address] [place], [place], [address] Phone: [phone]   Fax:  [phone]  Physical Therapy Treatment  Patient Details  Name: Priscilla Daniels MRN: [id number] Date of Birth: 1940/08/02 Referring Provider (PT): Paulene Floor   Encounter Date: 02/03/2021   PT End of Session - 02/03/21 1437     Visit Number 4    Number of Visits 8    Date for PT Re-Evaluation 02/26/21    PT Start Time 1433    PT Stop Time 1510    PT Time Calculation (min) 37 min    Equipment Utilized During Treatment Other (comment)   FWW   Activity Tolerance Patient limited by fatigue    Behavior During Therapy [facility] for tasks assessed/performed             Past Medical History:  Diagnosis Date   Abnormal vaginal Pap smear    Candidiasis    cuteneous    Chronic UTI    COPD (chronic obstructive pulmonary disease) (HCC) 2010   Dx by CXR    DM (diabetes mellitus) (HCC)    Fatigue    Glaucoma    Hemorrhage in optic nerve sheath of left eye 01/23/2020   Hyperlipidemia    Diet Controlled    NIDDM (non-insulin dependent diabetes mellitus)    Phlebitis    Strep pharyngitis    Vaginal atrophy    Vertigo     Past Surgical History:  Procedure Laterality Date   CERVICAL CONE BIOPSY     GLAUCOMA SURGERY  12/04 & 07/05/03   left eye cataract surgery  01/29/2009   right eye cataract surgery  01/13/09   VARICOSE VEIN SURGERY     both legs     There were no vitals filed for this visit.   Subjective Assessment - 02/03/21 1432     Subjective COVID-19 screen performed prior to patient entering clinic. Patient reports no new falls and some R LBP.    Patient is accompained by: Family member   Granddaughter   Pertinent History COPD, DM, scoliosis.    How long can you walk comfortably? Around home with a FWW.    Diagnostic tests X-ray.    Patient Stated Goals Walk and get around better.  Feel good, cook, stand better and drive.    Currently in Pain?  Yes    Pain Score 6     Pain Location Back    Pain Orientation Right;Lower    Pain Descriptors / Indicators Discomfort    Pain Type Acute pain    Pain Onset Today    Pain Frequency Constant                [facility] PT Assessment - 02/03/21 0001       Assessment   Medical Diagnosis Arthralgia    Referring Provider (PT) Paulene Floor      Precautions   Precaution Comments HIGH FALL RISK.      Restrictions   Weight Bearing Restrictions No                           [facility] Adult PT Treatment/Exercise - 02/03/21 0001       Knee/Hip Exercises: Aerobic   Nustep L2 x42min UE/LE      Knee/Hip Exercises: Seated   Long Arc Quad Strengthening;Both;2 sets;10 reps;Weights    Long Arc Quad Weight 2 lbs.    Clamshell with TheraBand Yellow   x15 reps  Marching Strengthening;Both;2 sets;10 reps    Marching Limitations 2#    Hamstring Curl Strengthening;Both;15 reps    Hamstring Limitations yellow band                         PT Long Term Goals - 01/29/21 1255       PT LONG TERM GOAL #1   Title Independent with a HEP.    Time 4    Period Weeks    Status On-going      PT LONG TERM GOAL #2   Title Increase bilateral hip strength by a solid muscle strength grade to increase stability for functional tasks.    Time 4    Period Weeks    Status On-going      PT LONG TERM GOAL #3   Title Perform 4 stairs with one railing with a reciprocating fashion.    Time 4    Period Weeks    Status On-going      PT LONG TERM GOAL #4   Title Walk 250 feet in clinic in a non step-to fashion.    Time 4    Period Weeks    Status On-going                   Plan - 02/03/21 1520     Clinical Impression Statement Patient presented in clinic with only reports of R LBP. Patient's granddaughter concerned as patient is highly suspectible to UTIs. To be careful, it was recommended to consult with PCP to prevent UTI. Patient able to tolerate light  strengthening of LEs but fatigued very quickly and with minimal resistance. Patient states that she tries to continue all exercises sitting without resistance at home.    Personal Factors and Comorbidities Comorbidity 1;Comorbidity 2;Other    Comorbidities COPD, DM, scoliosis.    Examination-Activity Limitations Other;Transfers;Locomotion Level;Stairs    Examination-Participation Restrictions Other    Stability/Clinical Decision Making Evolving/Moderate complexity    Rehab Potential Good    PT Frequency 2x / week    PT Duration 4 weeks    PT Treatment/Interventions ADLs/Self Care Home Management;Gait training;Stair training;Functional mobility training;Therapeutic activities;Therapeutic exercise;Balance training;Neuromuscular re-education;Manual techniques;Patient/family education    PT Next Visit Plan cont with LE ther ex, gait and balance training. Transition sitting to standing and supine as tolerated    Consulted and Agree with Plan of Care Patient             Patient will benefit from skilled therapeutic intervention in order to improve the following deficits and impairments:  Abnormal gait, Difficulty walking, Decreased activity tolerance, Decreased balance, Decreased mobility, Decreased strength  Visit Diagnosis: Muscle weakness (generalized)  Unsteadiness on feet     Problem List Patient Active Problem List   Diagnosis Date Noted   Dysuria 09/12/2020   Fatigue 09/12/2020   Posterior vitreous detachment, both eyes 07/30/2020   Central retinal vein occlusion of left eye 01/23/2020   Intermediate stage nonexudative age-related macular degeneration of both eyes 01/23/2020   Primary open angle glaucoma of both eyes, moderate stage 01/23/2020   Retinal hemorrhage of left eye 01/23/2020   Metabolic syndrome 02/21/2016   CKD (chronic kidney disease), stage III (HCC) 02/11/2015   Atherosclerosis of aorta (HCC) 02/08/2015   Glaucoma (increased eye pressure) 09/30/2012    Hyperlipidemia with target LDL less than 100 09/30/2012   Essential hypertension 09/30/2012    Marvell Fuller, PTA 02/03/2021, 3:34 PM   [facility]  [address] [place], [place], [address] Phone: [phone]   Fax:  [phone]  Name: DEMIKA LANGENDERFER MRN: [id number] Date of Birth: 03/04/1941

## 2021-02-05 ENCOUNTER — Other Ambulatory Visit: Payer: Self-pay

## 2021-02-05 ENCOUNTER — Ambulatory Visit (INDEPENDENT_AMBULATORY_CARE_PROVIDER_SITE_OTHER): Payer: Medicare Other | Admitting: Family Medicine

## 2021-02-05 ENCOUNTER — Encounter: Payer: Self-pay | Admitting: Physical Therapy

## 2021-02-05 ENCOUNTER — Ambulatory Visit: Payer: Medicare Other | Admitting: Physical Therapy

## 2021-02-05 ENCOUNTER — Encounter: Payer: Self-pay | Admitting: Family Medicine

## 2021-02-05 VITALS — BP 142/82 | HR 82 | Temp 97.7°F | Ht 67.0 in | Wt 142.0 lb

## 2021-02-05 DIAGNOSIS — R2681 Unsteadiness on feet: Secondary | ICD-10-CM | POA: Diagnosis not present

## 2021-02-05 DIAGNOSIS — R300 Dysuria: Secondary | ICD-10-CM

## 2021-02-05 DIAGNOSIS — N3000 Acute cystitis without hematuria: Secondary | ICD-10-CM | POA: Diagnosis not present

## 2021-02-05 DIAGNOSIS — M6281 Muscle weakness (generalized): Secondary | ICD-10-CM

## 2021-02-05 LAB — URINALYSIS, COMPLETE
Bilirubin, UA: NEGATIVE
Glucose, UA: NEGATIVE
Ketones, UA: NEGATIVE
Nitrite, UA: POSITIVE — AB
Protein,UA: NEGATIVE
RBC, UA: NEGATIVE
Specific Gravity, UA: 1.02 (ref 1.005–1.030)
Urobilinogen, Ur: 0.2 mg/dL (ref 0.2–1.0)
pH, UA: 6 (ref 5.0–7.5)

## 2021-02-05 LAB — MICROSCOPIC EXAMINATION
Epithelial Cells (non renal): NONE SEEN /hpf (ref 0–10)
RBC, Urine: NONE SEEN /hpf (ref 0–2)
Renal Epithel, UA: NONE SEEN /hpf

## 2021-02-05 MED ORDER — CIPROFLOXACIN HCL 500 MG PO TABS: 500.0000 mg | ORAL_TABLET | Freq: Two times a day (BID) | ORAL | 0 refills | Status: DC

## 2021-02-05 NOTE — Patient Instructions (Signed)
Urinary Tract Infection, Adult A urinary tract infection (UTI) is an infection of any part of the urinary tract. The urinary tract includes the kidneys, ureters, bladder, and urethra. These organs make, store, and get rid of urine in the body. An upper UTI affects the ureters and kidneys. A lower UTI affects the bladder and urethra. What are the causes? Most urinary tract infections are caused by bacteria in your genital area around your urethra, where urine leaves your body. These bacteria grow and cause inflammation of your urinary tract. What increases the risk? You are more likely to develop this condition if: You have a urinary catheter that stays in place. You are not able to control when you urinate or have a bowel movement (incontinence). You are female and you: Use a spermicide or diaphragm for birth control. Have low estrogen levels. Are pregnant. You have certain genes that increase your risk. You are sexually active. You take antibiotic medicines. You have a condition that causes your flow of urine to slow down, such as: An enlarged prostate, if you are female. Blockage in your urethra. A kidney stone. A nerve condition that affects your bladder control (neurogenic bladder). Not getting enough to drink, or not urinating often. You have certain medical conditions, such as: Diabetes. A weak disease-fighting system (immunesystem). Sickle cell disease. Gout. Spinal cord injury. What are the signs or symptoms? Symptoms of this condition include: Needing to urinate right away (urgency). Frequent urination. This may include small amounts of urine each time you urinate. Pain or burning with urination. Blood in the urine. Urine that smells bad or unusual. Trouble urinating. Cloudy urine. Vaginal discharge, if you are female. Pain in the abdomen or the lower back. You may also have: Vomiting or a decreased appetite. Confusion. Irritability or tiredness. A fever or  chills. Diarrhea. The first symptom in older adults may be confusion. In some cases, they may not have any symptoms until the infection has worsened. How is this diagnosed? This condition is diagnosed based on your medical history and a physical exam. You may also have other tests, including: Urine tests. Blood tests. Tests for STIs (sexually transmitted infections). If you have had more than one UTI, a cystoscopy or imaging studies may be done to determine the cause of the infections. How is this treated? Treatment for this condition includes: Antibiotic medicine. Over-the-counter medicines to treat discomfort. Drinking enough water to stay hydrated. If you have frequent infections or have other conditions such as a kidney stone, you may need to see a health care provider who specializes in the urinary tract (urologist). In rare cases, urinary tract infections can cause sepsis. Sepsis is a life-threatening condition that occurs when the body responds to an infection. Sepsis is treated in the hospital with IV antibiotics, fluids, and other medicines. Follow these instructions at home: Medicines Take over-the-counter and prescription medicines only as told by your health care provider. If you were prescribed an antibiotic medicine, take it as told by your health care provider. Do not stop using the antibiotic even if you start to feel better. General instructions Make sure you: Empty your bladder often and completely. Do not hold urine for long periods of time. Empty your bladder after sex. Wipe from front to back after urinating or having a bowel movement if you are female. Use each tissue only one time when you wipe. Drink enough fluid to keep your urine pale yellow. Keep all follow-up visits. This is important. Contact a health care provider   if: Your symptoms do not get better after 1-2 days. Your symptoms go away and then return. Get help right away if: You have severe pain in your  back or your lower abdomen. You have a fever or chills. You have nausea or vomiting. Summary A urinary tract infection (UTI) is an infection of any part of the urinary tract, which includes the kidneys, ureters, bladder, and urethra. Most urinary tract infections are caused by bacteria in your genital area. Treatment for this condition often includes antibiotic medicines. If you were prescribed an antibiotic medicine, take it as told by your health care provider. Do not stop using the antibiotic even if you start to feel better. Keep all follow-up visits. This is important. This information is not intended to replace advice given to you by your health care provider. Make sure you discuss any questions you have with your health care provider. Document Revised: 01/19/2020 Document Reviewed: 01/19/2020 Elsevier Patient Education  2022 [employer]  

## 2021-02-05 NOTE — Therapy (Signed)
[facility] [address] [place], [place], [address] Phone: [phone]   Fax:  [phone]  Physical Therapy Treatment  Patient Details  Name: Priscilla Daniels MRN: [id number] Date of Birth: 15-Oct-1940 Referring Provider (PT): Paulene Floor   Encounter Date: 02/05/2021   PT End of Session - 02/05/21 1307     Visit Number 5    Number of Visits 8    Date for PT Re-Evaluation 02/26/21    PT Start Time 1304    PT Stop Time 1336   requested to leave early for MD visit   PT Time Calculation (min) 32 min    Equipment Utilized During Treatment Other (comment)   FWW   Activity Tolerance Patient limited by fatigue    Behavior During Therapy [name] for tasks assessed/performed             Past Medical History:  Diagnosis Date   Abnormal vaginal Pap smear    Candidiasis    cuteneous    Chronic UTI    COPD (chronic obstructive pulmonary disease) (HCC) 2010   Dx by CXR    DM (diabetes mellitus) (HCC)    Fatigue    Glaucoma    Hemorrhage in optic nerve sheath of left eye 01/23/2020   Hyperlipidemia    Diet Controlled    NIDDM (non-insulin dependent diabetes mellitus)    Phlebitis    Strep pharyngitis    Vaginal atrophy    Vertigo     Past Surgical History:  Procedure Laterality Date   CERVICAL CONE BIOPSY     GLAUCOMA SURGERY  12/04 & 07/05/03   left eye cataract surgery  01/29/2009   right eye cataract surgery  01/13/09   VARICOSE VEIN SURGERY     both legs     There were no vitals filed for this visit.   Subjective Assessment - 02/05/21 1303     Subjective COVID-19 screen performed prior to patient entering clinic. Patient and daughter reports that she needs to leave a little early to make it to PCP for UTI test. Patient states that R LBP is worsening and thinks that it probably is due to UTI.    Patient is accompained by: Family member   Daughter   Pertinent History COPD, DM, scoliosis.    How long can you walk comfortably?  Around home with a FWW.    Diagnostic tests X-ray.    Patient Stated Goals Walk and get around better.  Feel good, cook, stand better and drive.    Currently in Pain? Yes    Pain Score 8     Pain Location Back    Pain Orientation Right;Lower    Pain Descriptors / Indicators Discomfort    Pain Type Acute pain    Pain Onset In the past 7 days    Pain Frequency Constant                [facility] PT Assessment - 02/05/21 0001       Assessment   Medical Diagnosis Arthralgia    Referring Provider (PT) Paulene Floor      Precautions   Precaution Comments HIGH FALL RISK.                           [facility] Adult PT Treatment/Exercise - 02/05/21 0001       Knee/Hip Exercises: Aerobic   Nustep L1 x59min UE/LE      Knee/Hip Exercises: Seated  Long Arc [employer] Strengthening;Both;2 sets;10 reps;Weights    Long Arc [employer] 2 lbs.    Ball Squeeze x15 reps    Clamshell with TheraBand Yellow   x20 reps   Marching AROM;Both;2 sets;10 reps    Hamstring Curl Strengthening;Both;15 reps    Hamstring Limitations yellow band                         PT Long Term Goals - 01/29/21 1255       PT LONG TERM GOAL #1   Title Independent with a HEP.    Time 4    Period Weeks    Status On-going      PT LONG TERM GOAL #2   Title Increase bilateral hip strength by a solid muscle strength grade to increase stability for functional tasks.    Time 4    Period Weeks    Status On-going      PT LONG TERM GOAL #3   Title Perform 4 stairs with one railing with a reciprocating fashion.    Time 4    Period Weeks    Status On-going      PT LONG TERM GOAL #4   Title Walk 250 feet in clinic in a non step-to fashion.    Time 4    Period Weeks    Status On-going                   Plan - 02/05/21 1417     Clinical Impression Statement Patient presented in clinic with reports of worsening R LBP but to see PCP after PT today. Patient able to tolerate light  therex with reporting of progressing fatigue as therex session progressed. Patient and patient's daughter reports that she remains dedicated to HEP in sitting while her daughter is at work. Patient states that she does not wear shoes within the house but wears socks. Patient and her daughter were asked to make sure they were the non-slip socks to avoid falling.    Personal Factors and Comorbidities Comorbidity 1;Comorbidity 2;Other    Comorbidities COPD, DM, scoliosis.    Examination-Activity Limitations Other;Transfers;Locomotion Level;Stairs    Examination-Participation Restrictions Other    Stability/Clinical Decision Making Evolving/Moderate complexity    Rehab Potential Good    PT Frequency 2x / week    PT Duration 4 weeks    PT Treatment/Interventions ADLs/Self Care Home Management;Gait training;Stair training;Functional mobility training;Therapeutic activities;Therapeutic exercise;Balance training;Neuromuscular re-education;Manual techniques;Patient/family education    PT Next Visit Plan cont with LE ther ex, gait and balance training. Transition sitting to standing and supine as tolerated    Consulted and Agree with Plan of Care Patient             Patient will benefit from skilled therapeutic intervention in order to improve the following deficits and impairments:  Abnormal gait, Difficulty walking, Decreased activity tolerance, Decreased balance, Decreased mobility, Decreased strength  Visit Diagnosis: Muscle weakness (generalized)  Unsteadiness on feet     Problem List Patient Active Problem List   Diagnosis Date Noted   Dysuria 09/12/2020   Fatigue 09/12/2020   Posterior vitreous detachment, both eyes 07/30/2020   Central retinal vein occlusion of left eye 01/23/2020   Intermediate stage nonexudative age-related macular degeneration of both eyes 01/23/2020   Primary open angle glaucoma of both eyes, moderate stage 01/23/2020   Retinal hemorrhage of left eye  01/23/2020   Metabolic syndrome 02/21/2016   CKD (chronic kidney disease), stage  III (HCC) 02/11/2015   Atherosclerosis of aorta (HCC) 02/08/2015   Glaucoma (increased eye pressure) 09/30/2012   Hyperlipidemia with target LDL less than 100 09/30/2012   Essential hypertension 09/30/2012    Marvell Fuller, PTA 02/05/2021, 2:21 PM  [facility] [address] [place], [place], [address] Phone: [phone]   Fax:  [phone]  Name: Priscilla Daniels MRN: [id number] Date of Birth: 1940-10-24

## 2021-02-05 NOTE — Progress Notes (Signed)
WNU:UVOZDG, Mary-Margaret, FNP Chief Complaint  Patient presents with   Urinary Tract Infection    Right side back/flank, urine is sometimes clear, sometimes dark    Current Issues:  Presents with 5 days of dysuria, urinary urgency, and urinary frequency Associated symptoms include:  cloudy urine, dysuria, flank pain on the right, urinary frequency, and urinary urgency  There is no history of of similar symptoms. Sexually active:  No   No concern for STI.  Prior to Admission medications   Medication Sig Start Date End Date Taking? Authorizing Provider  baclofen (LIORESAL) 10 MG tablet Take 10 mg by mouth 3 (three) times daily.   Yes [provider]  CRANBERRY PO Take by mouth.   Yes [provider]  diclofenac (VOLTAREN) 75 MG EC tablet TAKE ONE TABLET BY MOUTH TWICE DAILY 12/05/20  Yes Daphine Deutscher, Mary-Margaret, FNP  dorzolamide (TRUSOPT) 2 % ophthalmic solution 2 (two) times daily. 09/12/20  Yes [provider]  lisinopril-hydrochlorothiazide (ZESTORETIC) 20-12.5 MG tablet TAKE ONE (1) TABLET EACH DAY 09/12/20  Yes Daphine Deutscher, Mary-Margaret, FNP  meloxicam (MOBIC) 15 MG tablet Take 1 tablet (15 mg total) by mouth daily. 12/26/20  Yes Daphine Deutscher, Mary-Margaret, FNP  rosuvastatin (CRESTOR) 10 MG tablet TAKE ONE (1) TABLET BY MOUTH EVERY DAY 01/03/21  Yes Daphine Deutscher, Mary-Margaret, FNP  TRAVATAN Z 0.004 % SOLN ophthalmic solution  09/21/12  Yes [provider]  VITAMIN D PO Take by mouth.   Yes [provider]   Review of Systems  Constitutional:  Negative for chills, diaphoresis, fever, malaise/fatigue and weight loss.  Genitourinary:  Positive for dysuria, flank pain, frequency and urgency. Negative for hematuria.  Neurological:  Negative for weakness.  All other systems reviewed and are negative.   PE:  BP (!) 180/87   Pulse 82   Temp 97.7 F (36.5 C)   Ht 5\' 7"  (1.702 m)   Wt 142 lb (64.4 kg)   SpO2 97%   BMI 22.24 kg/m  Physical Exam Vitals and  nursing note reviewed.  Constitutional:      General: She is not in acute distress.    Appearance: Normal appearance. She is normal weight. She is not ill-appearing, toxic-appearing or diaphoretic.  HENT:     Head: Normocephalic and atraumatic.     Nose: Nose normal.     Mouth/Throat:     Mouth: Mucous membranes are moist.     Pharynx: Oropharynx is clear.  Eyes:     Extraocular Movements: Extraocular movements intact.     Pupils: Pupils are equal, round, and reactive to light.  Cardiovascular:     Rate and Rhythm: Normal rate and regular rhythm.  Abdominal:     General: Abdomen is flat. Bowel sounds are normal. There is no distension.     Palpations: Abdomen is soft. There is no mass.     Tenderness: There is no abdominal tenderness. There is no right CVA tenderness, left CVA tenderness, guarding or rebound.     Hernia: No hernia is present.  Musculoskeletal:     Cervical back: Normal range of motion and neck supple.  Skin:    General: Skin is warm and dry.  Neurological:     General: No focal deficit present.     Mental Status: She is alert and oriented to person, place, and time. Mental status is at baseline.     Gait: Gait abnormal (uses walker).  Psychiatric:        Mood and Affect: Mood normal.  Behavior: Behavior normal.        Thought Content: Thought content normal.        Judgment: Judgment normal.    Results for orders placed or performed in visit on 12/26/20  Arthritis Panel  Result Value Ref Range   Uric Acid 4.1 3.1 - 7.9 mg/dL   Rhuematoid fact SerPl-aCnc <10.0 <14.0 IU/mL   WBC 6.0 3.4 - 10.8 x10E3/uL   RBC 4.09 3.77 - 5.28 x10E6/uL   Hemoglobin 11.7 11.1 - 15.9 g/dL   Hematocrit 82.9 56.2 - 46.6 %   MCV 86 79 - 97 fL   MCH 28.6 26.6 - 33.0 pg   MCHC 33.2 31.5 - 35.7 g/dL   RDW 13.0 86.5 - 78.4 %   Platelets 228 150 - 450 x10E3/uL   Neutrophils 50 Not Estab. %   Lymphs 37 Not Estab. %   Monocytes 12 Not Estab. %   Eos 1 Not Estab. %   Basos 0  Not Estab. %   Neutrophils Absolute 3.0 1.4 - 7.0 x10E3/uL   Lymphocytes Absolute 2.2 0.7 - 3.1 x10E3/uL   Monocytes Absolute 0.7 0.1 - 0.9 x10E3/uL   EOS (ABSOLUTE) 0.0 0.0 - 0.4 x10E3/uL   Basophils Absolute 0.0 0.0 - 0.2 x10E3/uL   Immature Granulocytes 0 Not Estab. %   Immature Grans (Abs) 0.0 0.0 - 0.1 x10E3/uL   Sed Rate 14 0 - 40 mm/hr   Urinalysis in office: positive nitrites, 1+ leukocytes, negative blood  Assessment and Plan:  Ayanni was seen today for urinary tract infection.  Diagnoses and all orders for this visit:  Dysuria Recurrent in nature. Urinalysis as noted. Culture pending. Will change antibiotic therapy if warranted.  -     Urine Culture -     Urinalysis, Complete  Acute cystitis without hematuria Urinalysis as noted. Recurrent UTIs. Prior cultures reviewed. Will treat with Cipro due to recurrent nature. Symptomatic care discussed in detail. Repeat urinalysis in 2 weeks. If pt has another UTI, consider referral to urology or preventative medications.  -     ciprofloxacin (CIPRO) 500 MG tablet; Take 1 tablet (500 mg total) by mouth 2 (two) times daily.   The above assessment and management plan was discussed with the patient. The patient verbalized understanding of and has agreed to the management plan. Patient is aware to call the clinic if they develop any new symptoms or if symptoms fail to improve or worsen. Patient is aware when to return to the clinic for a follow-up visit. Patient educated on when it is appropriate to go to the emergency department.   Return in about 2 weeks (around 02/19/2021), or if symptoms worsen or fail to improve, for UA with culture.  Kari Baars, FNP-C [facility] [address] [place], [place] [address] [phone]

## 2021-02-08 LAB — URINE CULTURE

## 2021-02-12 ENCOUNTER — Ambulatory Visit: Payer: Medicare Other | Admitting: Physical Therapy

## 2021-02-12 ENCOUNTER — Other Ambulatory Visit: Payer: Self-pay | Admitting: Nurse Practitioner

## 2021-02-12 ENCOUNTER — Other Ambulatory Visit: Payer: Self-pay

## 2021-02-12 DIAGNOSIS — M6281 Muscle weakness (generalized): Secondary | ICD-10-CM

## 2021-02-12 DIAGNOSIS — R2681 Unsteadiness on feet: Secondary | ICD-10-CM

## 2021-02-12 DIAGNOSIS — M2550 Pain in unspecified joint: Secondary | ICD-10-CM

## 2021-02-12 NOTE — Therapy (Signed)
[facility] [place], [place], [address] Phone: [phone]   Fax:  [phone]  Physical Therapy Treatment  Patient Details  Name: Priscilla Daniels MRN: [id number] Date of Birth: 06-02-1941 Referring Provider (PT): Ronnald Collum   Encounter Date: 02/12/2021   PT End of Session - 02/12/21 1306     Visit Number 6    Number of Visits 8    Date for PT Re-Evaluation 02/26/21    PT Start Time 0100    PT Stop Time 0142    PT Time Calculation (min) 42 min    Activity Tolerance Patient tolerated treatment well    Behavior During Therapy [facility] for tasks assessed/performed             Past Medical History:  Diagnosis Date   Abnormal vaginal Pap smear    Candidiasis    cuteneous    Chronic UTI    COPD (chronic obstructive pulmonary disease) ([place]) 2010   Dx by CXR    DM (diabetes mellitus) ([place])    Fatigue    Glaucoma    Hemorrhage in optic nerve sheath of left eye 01/23/2020   Hyperlipidemia    Diet Controlled    NIDDM (non-insulin dependent diabetes mellitus)    Phlebitis    Strep pharyngitis    Vaginal atrophy    Vertigo     Past Surgical History:  Procedure Laterality Date   CERVICAL CONE BIOPSY     GLAUCOMA SURGERY  12/04 & 07/05/03   left eye cataract surgery  01/29/2009   right eye cataract surgery  01/13/09   VARICOSE VEIN SURGERY     both legs     There were no vitals filed for this visit.   Subjective Assessment - 02/12/21 1256     Subjective COVID-19 screen performed prior to patient entering clinic. Patient and daughter arrived and reported patients legs felt heavy today and no back pain today.    Patient is accompained by: Family member    Pertinent History COPD, DM, scoliosis.    How long can you walk comfortably? Around home with a FWW.    Diagnostic tests X-ray.    Patient Stated Goals Walk and get around better.  Feel good, cook, stand better and drive.    Currently in Pain? No/denies                                [facility] Adult PT Treatment/Exercise - 02/12/21 0001       Knee/Hip Exercises: Aerobic   Nustep L2 x40mn UE/LE activity 30+ SPM      Knee/Hip Exercises: Standing   Hip Flexion Stengthening;AROM;Both;2 sets;10 reps;Knee bent    Forward Step Up Both;Step Height: 4";Hand Hold: 2   x10 RT, x5 LT   Other Standing Knee Exercises lateral stepping in paralel bars    Other Standing Knee Exercises in parallel bars for step through gait pattern using tape on floor      Knee/Hip Exercises: Supine   Bridges Strengthening;Both;15 reps    Straight Leg Raises Strengthening;Both;10 reps;2 sets    Other Supine Knee/Hip Exercises clamshell with yellow band x20                         PT Long Term Goals - 02/12/21 1307       PT LONG TERM GOAL #1   Title Independent with a HEP.  Baseline Doing well with HEP 02/12/21    Time 4    Period Weeks    Status Achieved      PT LONG TERM GOAL #2   Title Increase bilateral hip strength by a solid muscle strength grade to increase stability for functional tasks.    Time 4    Period Weeks    Status On-going      PT LONG TERM GOAL #3   Title Perform 4 stairs with one railing with a reciprocating fashion.    Time 4    Period Weeks    Status On-going      PT LONG TERM GOAL #4   Title Walk 250 feet in clinic in a non step-to fashion.    Baseline step to gait is ongoing 02/12/21    Time 4    Period Weeks    Status On-going                   Plan - 02/12/21 1338     Clinical Impression Statement Patient tolerated treatment well today. today focused on standing progresion with step through gait pattern. Patient doing well with HEP per patient and daughter. Patient met LTG #1 with remaining ongoing.    Personal Factors and Comorbidities Comorbidity 1;Comorbidity 2;Other    Comorbidities COPD, DM, scoliosis.    Examination-Activity Limitations Other;Transfers;Locomotion Level;Stairs     Examination-Participation Restrictions Other    Stability/Clinical Decision Making Evolving/Moderate complexity    Rehab Potential Good    PT Frequency 2x / week    PT Duration 4 weeks    PT Treatment/Interventions ADLs/Self Care Home Management;Gait training;Stair training;Functional mobility training;Therapeutic activities;Therapeutic exercise;Balance training;Neuromuscular re-education;Manual techniques;Patient/family education    PT Next Visit Plan cont with LE ther ex, gait and balance training. Transition sitting to standing and supine as tolerated    Consulted and Agree with Plan of Care Patient;Family member/caregiver             Patient will benefit from skilled therapeutic intervention in order to improve the following deficits and impairments:  Abnormal gait, Difficulty walking, Decreased activity tolerance, Decreased balance, Decreased mobility, Decreased strength  Visit Diagnosis: Muscle weakness (generalized)  Unsteadiness on feet     Problem List Patient Active Problem List   Diagnosis Date Noted   Dysuria 09/12/2020   Fatigue 09/12/2020   Posterior vitreous detachment, both eyes 07/30/2020   Central retinal vein occlusion of left eye 01/23/2020   Intermediate stage nonexudative age-related macular degeneration of both eyes 01/23/2020   Primary open angle glaucoma of both eyes, moderate stage 01/23/2020   Retinal hemorrhage of left eye 70/26/3785   Metabolic syndrome 88/50/2774   CKD (chronic kidney disease), stage III ([place]) 02/11/2015   Atherosclerosis of aorta ([place]) 02/08/2015   Glaucoma (increased eye pressure) 09/30/2012   Hyperlipidemia with target LDL less than 100 09/30/2012   Essential hypertension 09/30/2012    [name] P, PTA 02/12/2021, 1:47 PM  [facility] [place], [place], [address] Phone: [phone]   Fax:  [phone]  Name: Priscilla Daniels MRN: [id number] Date of  Birth: 28-Oct-1940

## 2021-02-17 ENCOUNTER — Ambulatory Visit: Payer: Medicare Other | Admitting: Physical Therapy

## 2021-02-19 ENCOUNTER — Ambulatory Visit: Payer: Medicare Other | Admitting: Physical Therapy

## 2021-02-19 ENCOUNTER — Encounter: Payer: Self-pay | Admitting: Family Medicine

## 2021-02-19 ENCOUNTER — Telehealth: Payer: Self-pay | Admitting: Nurse Practitioner

## 2021-02-19 ENCOUNTER — Ambulatory Visit (INDEPENDENT_AMBULATORY_CARE_PROVIDER_SITE_OTHER): Payer: Medicare Other | Admitting: Family Medicine

## 2021-02-19 ENCOUNTER — Other Ambulatory Visit: Payer: Self-pay

## 2021-02-19 ENCOUNTER — Ambulatory Visit (INDEPENDENT_AMBULATORY_CARE_PROVIDER_SITE_OTHER): Payer: Medicare Other

## 2021-02-19 ENCOUNTER — Other Ambulatory Visit: Payer: Medicare Other

## 2021-02-19 VITALS — BP 110/64 | HR 64 | Temp 98.4°F

## 2021-02-19 DIAGNOSIS — R531 Weakness: Secondary | ICD-10-CM | POA: Diagnosis not present

## 2021-02-19 DIAGNOSIS — R300 Dysuria: Secondary | ICD-10-CM

## 2021-02-19 DIAGNOSIS — M5441 Lumbago with sciatica, right side: Secondary | ICD-10-CM

## 2021-02-19 DIAGNOSIS — R2681 Unsteadiness on feet: Secondary | ICD-10-CM | POA: Diagnosis not present

## 2021-02-19 DIAGNOSIS — M545 Low back pain, unspecified: Secondary | ICD-10-CM | POA: Diagnosis not present

## 2021-02-19 DIAGNOSIS — N3941 Urge incontinence: Secondary | ICD-10-CM

## 2021-02-19 DIAGNOSIS — G8929 Other chronic pain: Secondary | ICD-10-CM | POA: Diagnosis not present

## 2021-02-19 DIAGNOSIS — N3000 Acute cystitis without hematuria: Secondary | ICD-10-CM | POA: Diagnosis not present

## 2021-02-19 DIAGNOSIS — M6281 Muscle weakness (generalized): Secondary | ICD-10-CM | POA: Diagnosis not present

## 2021-02-19 LAB — URINALYSIS, ROUTINE W REFLEX MICROSCOPIC
Bilirubin, UA: NEGATIVE
Glucose, UA: NEGATIVE
Nitrite, UA: NEGATIVE
Protein,UA: NEGATIVE
RBC, UA: NEGATIVE
Specific Gravity, UA: 1.02 (ref 1.005–1.030)
Urobilinogen, Ur: 0.2 mg/dL (ref 0.2–1.0)
pH, UA: 6 (ref 5.0–7.5)

## 2021-02-19 LAB — MICROSCOPIC EXAMINATION: Renal Epithel, UA: NONE SEEN /hpf

## 2021-02-19 IMAGING — DX DG LUMBAR SPINE 2-3V
3 series · 3 of 3 positions shown · non-contrast
Comparison: None.

CLINICAL DATA: Low back pain after fall

EXAM:
LUMBAR SPINE - 2-3 VIEW

[l-spine ap]
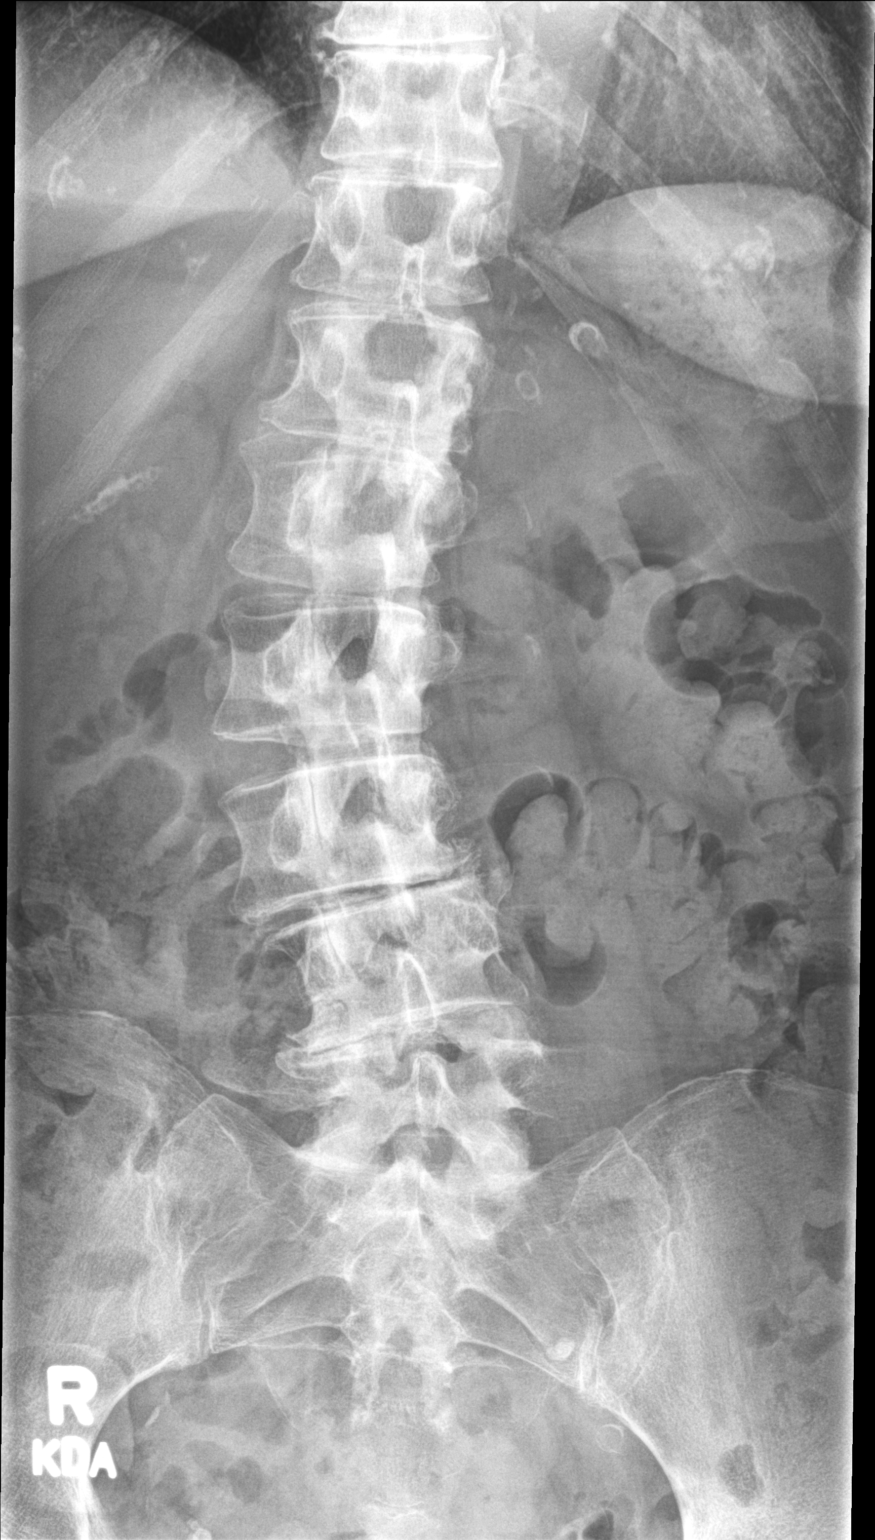

[l-spine lat (1 of 2)]
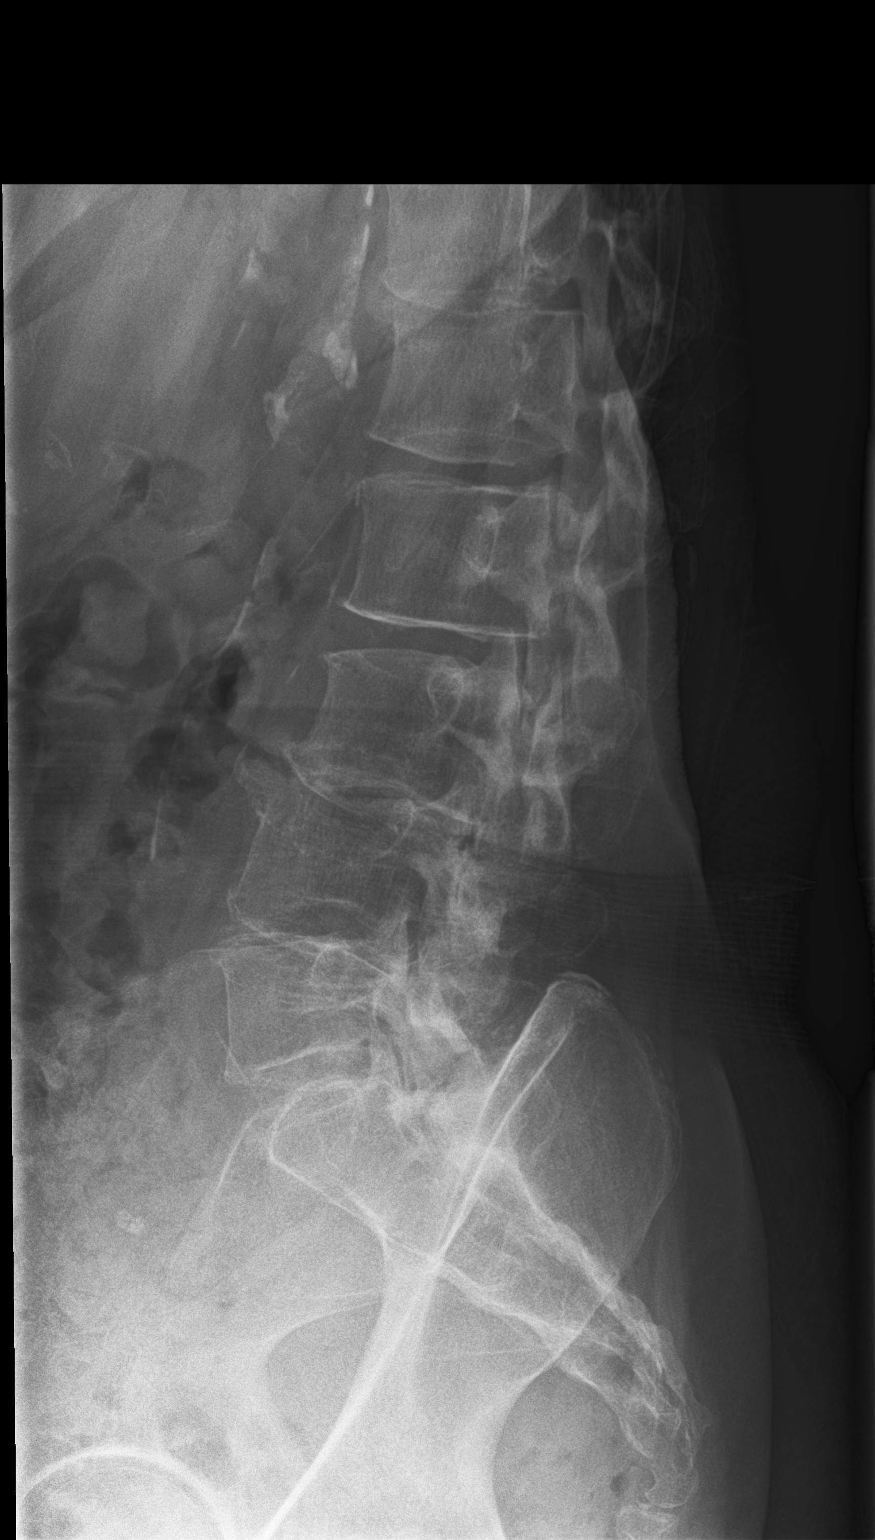

[l-spine lat (2 of 2)]
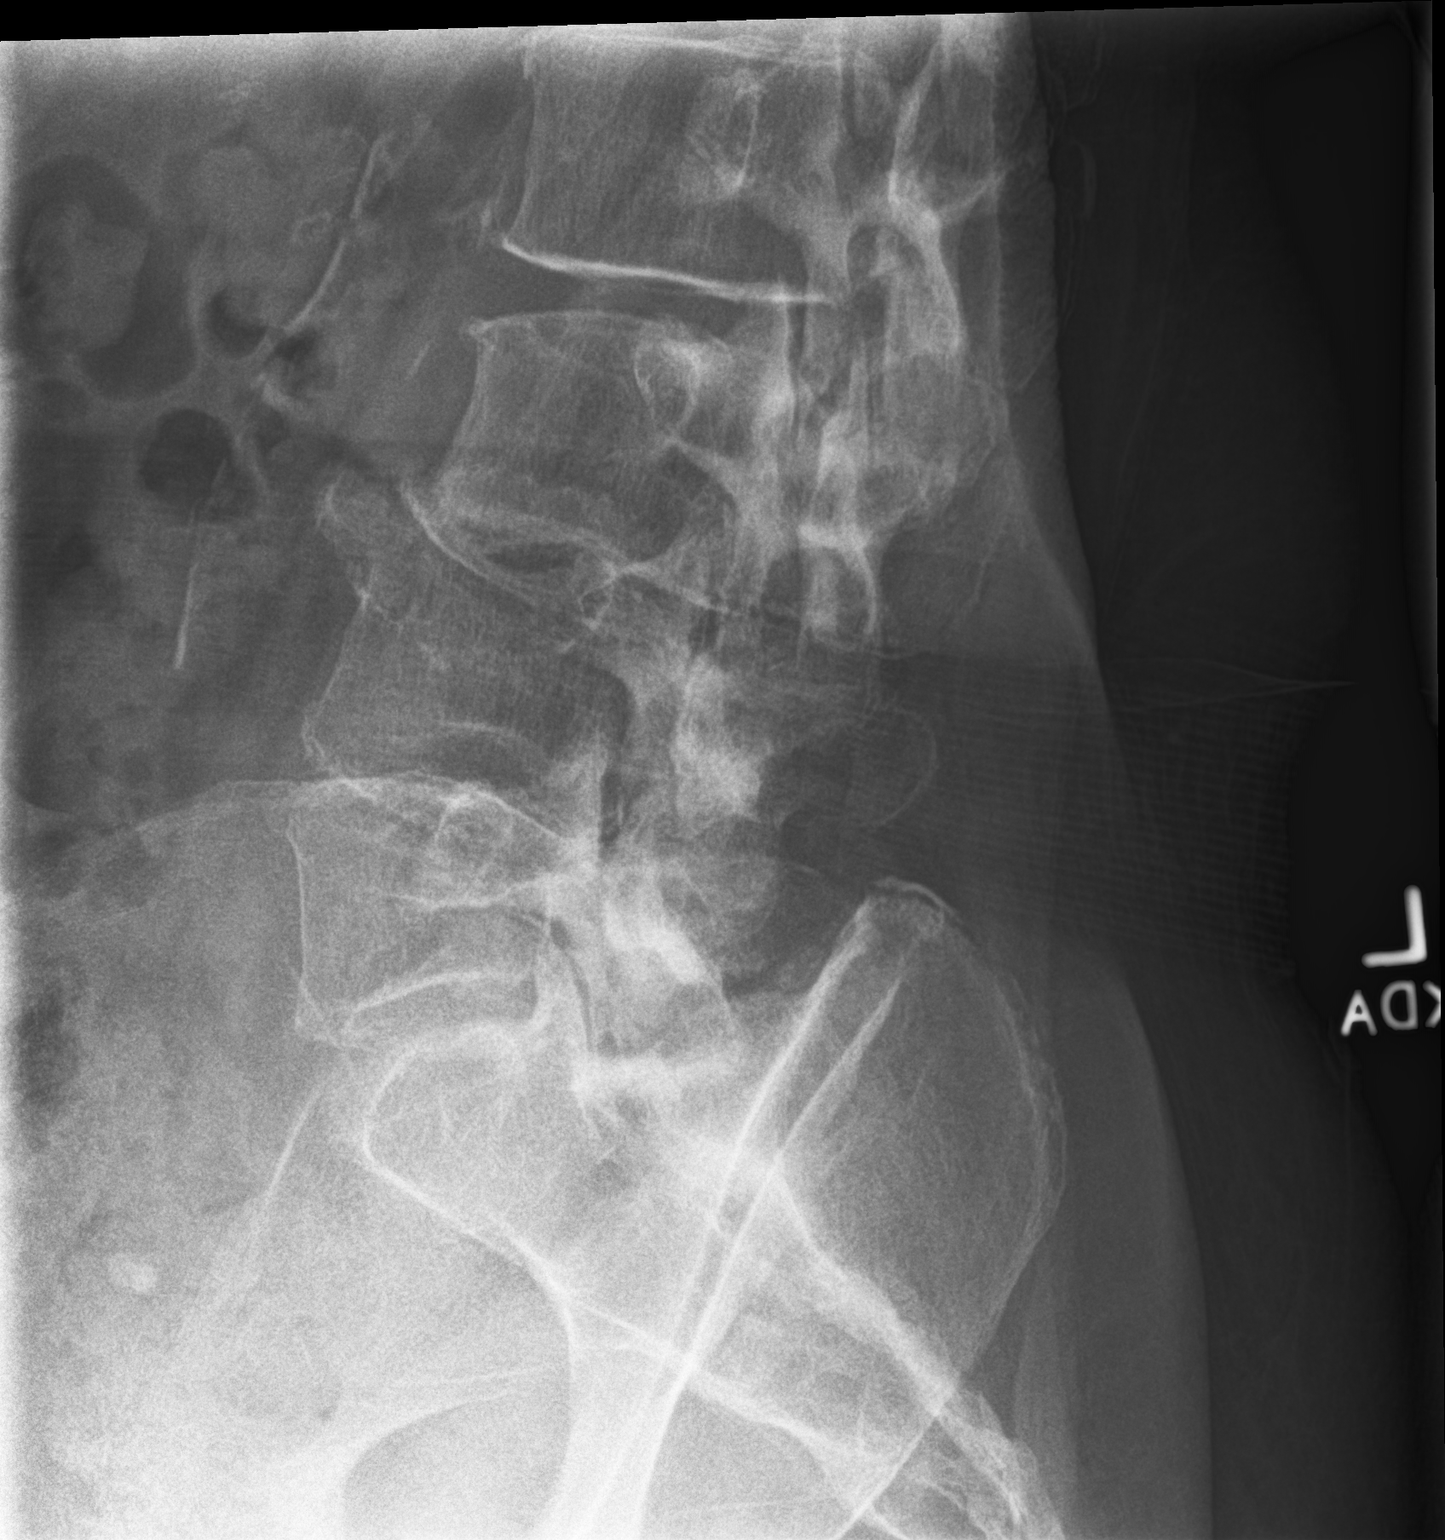

[3 of 3 positions shown; findings below may reference images not displayed]

FINDINGS: There are 6 non-rib-bearing lumbar type vertebral bodies. Vertebral
body heights are preserved. There is no evidence of acute fracture.

There is dextroscoliosis centered at L3. There is 5 mm right lateral
listhesis of L4 on L5. There is approximately 6 mm of retrolisthesis
of L3 on L4 and L4 on L5. There is intervertebral disc space
narrowing most advanced at L4-L5 with vacuum disc phenomenon. There
is mild multilevel degenerative endplate change and facet
arthropathy.

There is calcified atherosclerotic plaque of the aortic arch. The SI
joints are intact.
IMPRESSION: 1. Dextroscoliosis centered at L3, 5 mm right lateral listhesis of
L4 on L5, and 6 mm retrolisthesis of L3 on L4 and L4 on L5, all
likely degenerative in nature.
2. Advanced intervertebral disc space narrowing with vacuum disc
phenomenon at L4-L5.

## 2021-02-19 MED ORDER — PREDNISONE 10 MG (21) PO TBPK: ORAL_TABLET | ORAL | 0 refills | Status: DC

## 2021-02-19 MED ORDER — CEFDINIR 300 MG PO CAPS: 300.0000 mg | ORAL_CAPSULE | Freq: Two times a day (BID) | ORAL | 0 refills | Status: AC

## 2021-02-19 NOTE — Telephone Encounter (Signed)
I am fine with this. Her daughter explained to me that she is only off on Wednesdays to bring her mother to appointments, which conflicts with MMM being off on Wednesdays.

## 2021-02-19 NOTE — Therapy (Signed)
[facility] [address] [place], [place], [address] Phone: [phone]   Fax:  [phone]  Physical Therapy Treatment  Patient Details  Name: Priscilla Daniels MRN: [id number] Date of Birth: 06-17-1941 Referring Provider (PT): Paulene Floor   Encounter Date: 02/19/2021   PT End of Session - 02/19/21 1310     Visit Number 7    Number of Visits 8    Date for PT Re-Evaluation 02/26/21    PT Start Time 0100    PT Stop Time 0141    PT Time Calculation (min) 41 min    Activity Tolerance Patient tolerated treatment well;Patient limited by pain    Behavior During Therapy [facility] for tasks assessed/performed             Past Medical History:  Diagnosis Date   Abnormal vaginal Pap smear    Candidiasis    cuteneous    Chronic UTI    COPD (chronic obstructive pulmonary disease) (HCC) 2010   Dx by CXR    DM (diabetes mellitus) (HCC)    Fatigue    Glaucoma    Hemorrhage in optic nerve sheath of left eye 01/23/2020   Hyperlipidemia    Diet Controlled    NIDDM (non-insulin dependent diabetes mellitus)    Phlebitis    Strep pharyngitis    Vaginal atrophy    Vertigo     Past Surgical History:  Procedure Laterality Date   CERVICAL CONE BIOPSY     GLAUCOMA SURGERY  12/04 & 07/05/03   left eye cataract surgery  01/29/2009   right eye cataract surgery  01/13/09   VARICOSE VEIN SURGERY     both legs     There were no vitals filed for this visit.   Subjective Assessment - 02/19/21 1303     Subjective COVID-19 screen performed prior to patient entering clinic. Patient and daughter arrived and reported some sciatic pain and going to MD today.    Patient is accompained by: Family member    Pertinent History COPD, DM, scoliosis.    How long can you walk comfortably? Around home with a FWW.    Diagnostic tests X-ray.    Patient Stated Goals Walk and get around better.  Feel good, cook, stand better and drive.    Currently in Pain? Yes     Pain Score --   no number given   Pain Location Back    Pain Orientation Right;Lower    Pain Descriptors / Indicators Discomfort    Pain Type Acute pain    Pain Onset More than a month ago    Aggravating Factors  prolong standing    Pain Relieving Factors at rest                               [facility] Adult PT Treatment/Exercise - 02/19/21 0001       Knee/Hip Exercises: Aerobic   Nustep L3-2 x77min UE/LE activity      Knee/Hip Exercises: Seated   Long Arc Quad Strengthening;Both;3 sets;10 reps    Long Arc Quad Weight 2 lbs.    Clamshell with TheraBand Yellow   x15 each LE   Other Seated Knee/Hip Exercises setaed DF with yellow band for calf exercse x15 each LE    Hamstring Curl Strengthening;Both;20 reps    Hamstring Limitations yellow band    Sit to Sand 5 reps;with UE support   focus on glut activation  PT Long Term Goals - 02/19/21 1316       PT LONG TERM GOAL #1   Title Independent with a HEP.    Baseline Doing well with HEP 02/12/21    Time 4    Period Weeks    Status Achieved      PT LONG TERM GOAL #2   Title Increase bilateral hip strength by a solid muscle strength grade to increase stability for functional tasks.    Time 4    Period Weeks    Status On-going      PT LONG TERM GOAL #3   Title Perform 4 stairs with one railing with a reciprocating fashion.    Baseline limited with stairs at this time 02/19/21    Time 4    Period Weeks    Status On-going      PT LONG TERM GOAL #4   Title Walk 250 feet in clinic in a non step-to fashion.    Baseline step to gait is ongoing 02/19/21    Time 4    Period Weeks    Status On-going                   Plan - 02/19/21 1343     Clinical Impression Statement Patient tolerated treatment fair today due to right SIJ pain. Patient limited with standing due to discomfort. Today focused on seated exercises with increased reps and added exercise to improve LE  strength. Patient current goals ongoing due to limiations with weakness and pain.    Personal Factors and Comorbidities Comorbidity 1;Comorbidity 2;Other    Comorbidities COPD, DM, scoliosis.    Examination-Activity Limitations Other;Transfers;Locomotion Level;Stairs    Examination-Participation Restrictions Other    Stability/Clinical Decision Making Evolving/Moderate complexity    Rehab Potential Good    PT Frequency 2x / week    PT Duration 4 weeks    PT Treatment/Interventions ADLs/Self Care Home Management;Gait training;Stair training;Functional mobility training;Therapeutic activities;Therapeutic exercise;Balance training;Neuromuscular re-education;Manual techniques;Patient/family education    PT Next Visit Plan cont with LE ther ex, gait and balance training. Transition sitting to standing and supine as tolerated    Consulted and Agree with Plan of Care Patient;Family member/caregiver             Patient will benefit from skilled therapeutic intervention in order to improve the following deficits and impairments:  Abnormal gait, Difficulty walking, Decreased activity tolerance, Decreased balance, Decreased mobility, Decreased strength  Visit Diagnosis: Unsteadiness on feet  Muscle weakness (generalized)     Problem List Patient Active Problem List   Diagnosis Date Noted   Dysuria 09/12/2020   Fatigue 09/12/2020   Posterior vitreous detachment, both eyes 07/30/2020   Central retinal vein occlusion of left eye 01/23/2020   Intermediate stage nonexudative age-related macular degeneration of both eyes 01/23/2020   Primary open angle glaucoma of both eyes, moderate stage 01/23/2020   Retinal hemorrhage of left eye 01/23/2020   Metabolic syndrome 02/21/2016   CKD (chronic kidney disease), stage III (HCC) 02/11/2015   Atherosclerosis of aorta (HCC) 02/08/2015   Glaucoma (increased eye pressure) 09/30/2012   Hyperlipidemia with target LDL less than 100 09/30/2012    Essential hypertension 09/30/2012    Azavier Creson P, PTA 02/19/2021, 1:47 PM  [facility] [address] [place], [place], [address] Phone: [phone]   Fax:  [phone]  Name: Priscilla Daniels MRN: [id number] Date of Birth: Mar 05, 1941

## 2021-02-19 NOTE — Telephone Encounter (Signed)
Pt is wanting to switch from MMM to Alona Bene because of convenience. Please call back to schedule with Alona Bene if this is approved.

## 2021-02-19 NOTE — Progress Notes (Signed)
Assessment & Plan:  1. Chronic right-sided low back pain with right-sided sciatica - continue working with PT  - bedside commode for nocturnal use so she can sleep in her bed again  - plan to order MRI after x-ray due to progressive weakness and current dependence on a wheelchair - predniSONE (STERAPRED UNI-PAK 21 TAB) 10 MG (21) TBPK tablet; As directed x 6 days  Dispense: 21 tablet; Refill: 0 - DG Lumbar Spine 2-3 Views  2. Dysuria - ambulatory referral to urology - encouraged proper perineal hygiene - encouraged cranberry supplement to support urinary tract health - Urinalysis, Routine w reflex microscopic, Urine dipstick shows positive for WBC's, positive for leukocytes, and positive for ketones.  Micro exam: 6-10 WBC's per HPF and many bacteria.   3. Acute cystitis without hematuria - ambulatory referral to urology - encourages proper perineal hygiene - Urine Culture - cefdinir (OMNICEF) 300 MG capsule; Take 1 capsule (300 mg total) by mouth 2 (two) times daily for 10 days.  Dispense: 20 capsule; Refill: 0   Follow up plan: Return if symptoms worsen or fail to improve.  Floy Sabina, NP Student  Subjective:   Patient ID: Priscilla Daniels, female    DOB: 1940/07/23, 80 y.o.   MRN: [id number]  HPI: Priscilla Daniels is a 80 y.o. female presenting on 02/19/2021 for Fall (X 1 year ago and patient c/o of trouble walking since then. ) and Dysuria (Re checking recent UTI. States she always has dysuria. )  She is accompanied by her daughter who she is agreeable to be present.   She has had 6 episodes of dysuria and UTI this year. She states she is cleaning herself well but is having issues getting to the bathroom due to progressive immobility. She uses adult incontinence briefs to assist with hygiene. She has not been taking a cranberry supplement. She is interested in a referral to a urologist.   She states since she fell over a year ago she has had ongoing lower back and right hip  pain. She states that her legs intermittently give out from under her and can't support her legs. She has been working with PT for 4 weeks and states it is helping some but her legs still give out. She has been using a wheelchair for 4-5 months. She stated that her PCP stopped her baclofen due to potential contribution to her weakness, but it is the same without it. She said her PT thought her issues with mobility were related to sciatic nerve impingement and they have special therapy techniques if confirmed by her PCP. She has been sleeping on her couch because it is closer to her bathroom, which might not be helping her back pain. She is interested in a bedside commode so that she can start sleeping in her own bed again.    ROS: Negative unless specifically indicated above in HPI.   Relevant past medical history reviewed and updated as indicated.   Allergies and medications reviewed and updated.   Current Outpatient Medications:    baclofen (LIORESAL) 10 MG tablet, Take 10 mg by mouth 3 (three) times daily., Disp: , Rfl:    cefdinir (OMNICEF) 300 MG capsule, Take 1 capsule (300 mg total) by mouth 2 (two) times daily for 10 days., Disp: 20 capsule, Rfl: 0   CRANBERRY PO, Take by mouth., Disp: , Rfl:    diclofenac (VOLTAREN) 75 MG EC tablet, TAKE ONE TABLET BY MOUTH TWICE DAILY, Disp: 60 tablet, Rfl: 2  dorzolamide (TRUSOPT) 2 % ophthalmic solution, 2 (two) times daily., Disp: , Rfl:    lisinopril-hydrochlorothiazide (ZESTORETIC) 20-12.5 MG tablet, TAKE ONE (1) TABLET EACH DAY, Disp: 90 tablet, Rfl: 1   meloxicam (MOBIC) 15 MG tablet, TAKE ONE (1) TABLET BY MOUTH EVERY DAY, Disp: 30 tablet, Rfl: 1   predniSONE (STERAPRED UNI-PAK 21 TAB) 10 MG (21) TBPK tablet, As directed x 6 days, Disp: 21 tablet, Rfl: 0   rosuvastatin (CRESTOR) 10 MG tablet, TAKE ONE (1) TABLET BY MOUTH EVERY DAY, Disp: 90 tablet, Rfl: 0   TRAVATAN Z 0.004 % SOLN ophthalmic solution, , Disp: , Rfl:    VITAMIN D PO, Take by  mouth., Disp: , Rfl:   Allergies  Allergen Reactions   Lipitor [Atorvastatin Calcium]    Septra [Bactrim]    Sulfa Antibiotics     Objective:   BP 110/64   Pulse 64   Temp 98.4 F (36.9 C) (Temporal)    Physical Exam Vitals reviewed.  Constitutional:      General: She is not in acute distress.    Appearance: Normal appearance. She is not ill-appearing, toxic-appearing or diaphoretic.  HENT:     Head: Normocephalic and atraumatic.  Eyes:     General: No scleral icterus.       Right eye: No discharge.        Left eye: No discharge.     Conjunctiva/sclera: Conjunctivae normal.  Cardiovascular:     Rate and Rhythm: Normal rate.  Pulmonary:     Effort: Pulmonary effort is normal. No respiratory distress.  Musculoskeletal:     Cervical back: Normal range of motion.     Lumbar back: Bony tenderness present. Decreased range of motion. Positive right straight leg raise test and positive left straight leg raise test.     Right hip: Normal.     Left hip: Normal.  Skin:    General: Skin is warm and dry.     Capillary Refill: Capillary refill takes less than 2 seconds.  Neurological:     General: No focal deficit present.     Mental Status: She is alert and oriented to person, place, and time. Mental status is at baseline.  Psychiatric:        Mood and Affect: Mood normal.        Behavior: Behavior normal.        Thought Content: Thought content normal.        Judgment: Judgment normal.

## 2021-02-20 ENCOUNTER — Other Ambulatory Visit: Payer: Self-pay | Admitting: Family Medicine

## 2021-02-20 DIAGNOSIS — M5441 Lumbago with sciatica, right side: Secondary | ICD-10-CM

## 2021-02-20 DIAGNOSIS — Z9181 History of falling: Secondary | ICD-10-CM

## 2021-02-20 DIAGNOSIS — R531 Weakness: Secondary | ICD-10-CM

## 2021-02-20 DIAGNOSIS — M5136 Other intervertebral disc degeneration, lumbar region: Secondary | ICD-10-CM

## 2021-02-20 DIAGNOSIS — G8929 Other chronic pain: Secondary | ICD-10-CM

## 2021-02-20 DIAGNOSIS — M4316 Spondylolisthesis, lumbar region: Secondary | ICD-10-CM

## 2021-02-20 DIAGNOSIS — R2681 Unsteadiness on feet: Secondary | ICD-10-CM

## 2021-02-20 NOTE — Telephone Encounter (Signed)
Will contact patient once we hear back from PCP

## 2021-02-21 ENCOUNTER — Ambulatory Visit (HOSPITAL_COMMUNITY)
Admission: RE | Admit: 2021-02-21 | Discharge: 2021-02-21 | Disposition: A | Discharge status: Home / Self Care | Payer: Medicare Other | Source: Ambulatory Visit | Attending: Family Medicine | Admitting: Family Medicine

## 2021-02-21 ENCOUNTER — Other Ambulatory Visit: Payer: Self-pay

## 2021-02-21 DIAGNOSIS — M5136 Other intervertebral disc degeneration, lumbar region: Secondary | ICD-10-CM | POA: Diagnosis not present

## 2021-02-21 DIAGNOSIS — M4316 Spondylolisthesis, lumbar region: Secondary | ICD-10-CM | POA: Insufficient documentation

## 2021-02-21 DIAGNOSIS — M5441 Lumbago with sciatica, right side: Secondary | ICD-10-CM | POA: Diagnosis not present

## 2021-02-21 DIAGNOSIS — Z9181 History of falling: Secondary | ICD-10-CM | POA: Diagnosis not present

## 2021-02-21 DIAGNOSIS — R531 Weakness: Secondary | ICD-10-CM | POA: Diagnosis not present

## 2021-02-21 DIAGNOSIS — G8929 Other chronic pain: Secondary | ICD-10-CM | POA: Insufficient documentation

## 2021-02-21 DIAGNOSIS — R2681 Unsteadiness on feet: Secondary | ICD-10-CM | POA: Insufficient documentation

## 2021-02-21 DIAGNOSIS — M545 Low back pain, unspecified: Secondary | ICD-10-CM | POA: Diagnosis not present

## 2021-02-21 IMAGING — MR MR LUMBAR SPINE W/O CM
4 of 5 series · 31 of 48 positions shown · non-contrast
Comparison: Lumbar radiographs [DATE].

CLINICAL DATA: Spondylolisthesis of lumbar region [RI]
([RI]-CM)

Narrowing of lumbar intervertebral disc space [RI] ([RI]-CM)
Gait instability [RI] ([RI]-CM)
Weakness [RI] ([RI]-CM)
Chronic right-sided low back pain with right-sided sciatica M54.41,
[RI] ([RI]-CM)
History of fall [RI] ([RI]-CM)
EXAM:
MRI LUMBAR SPINE WITHOUT CONTRAST
TECHNIQUE: Multiplanar, multisequence MR imaging of the lumbar spine was
performed. No intravenous contrast was administered.

[Series 5: T2 · sagittal · 4.0mm · 0.68mm/px · 8 of 19 slices shown (1 of 2)]
[im 1/19]
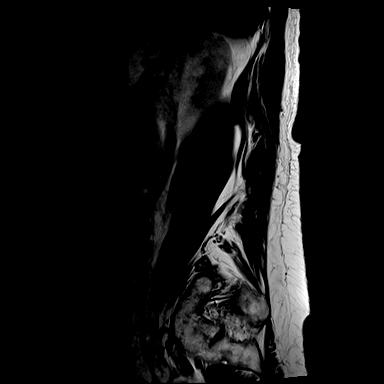
[im 3/19]
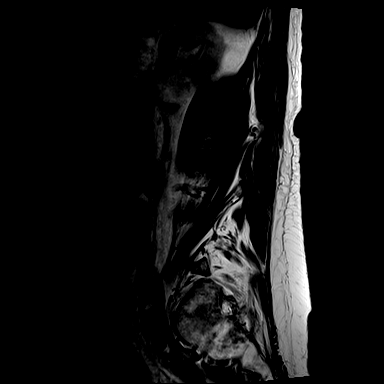
[im 6/19]
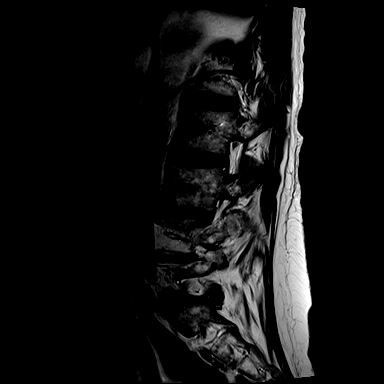
[im 8/19]
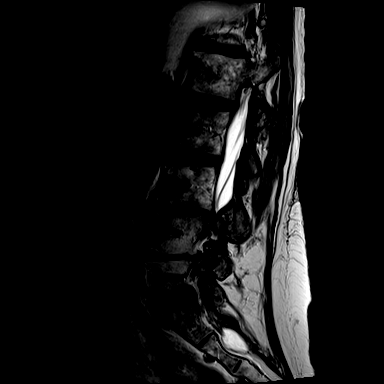
[im 11/19]
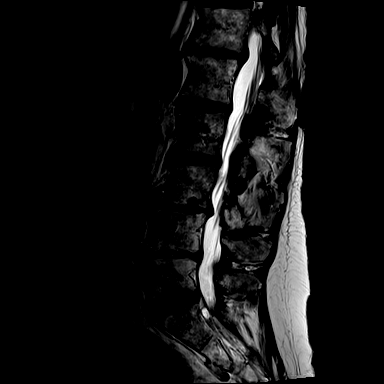
[im 13/19]
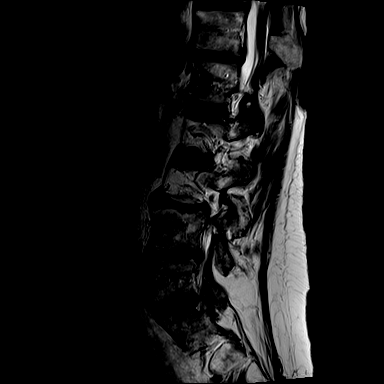
[im 16/19]
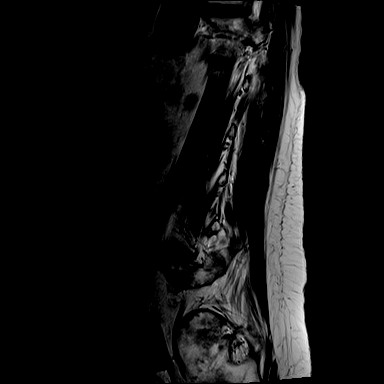
[im 19/19]
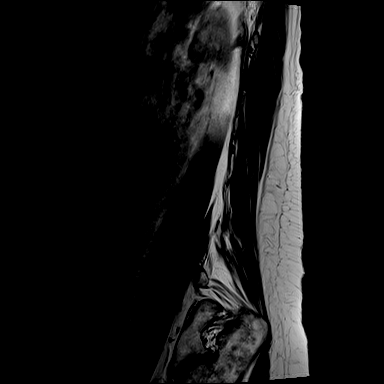

[Series 6: T1 · sagittal · 4.0mm · 0.81mm/px · 7 of 19 slices shown (1 of 2)]
[im 1/19]
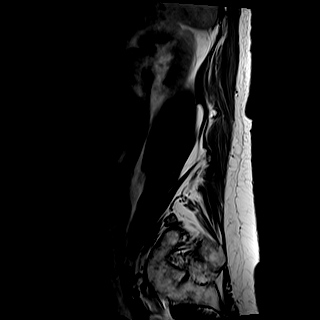
[im 4/19]
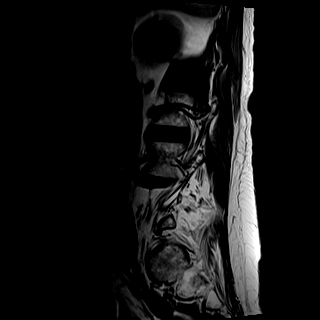
[im 7/19]
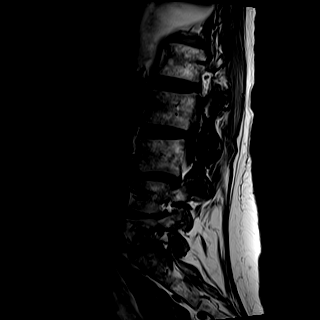
[im 10/19]
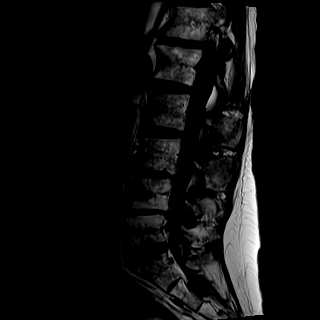
[im 13/19]
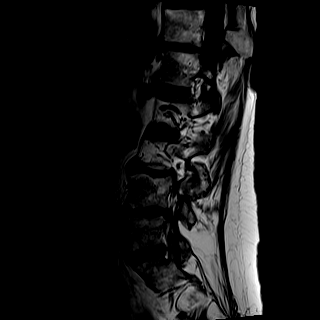
[im 16/19]
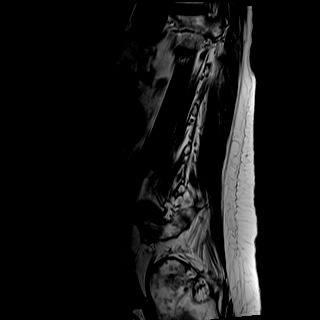
[im 19/19]
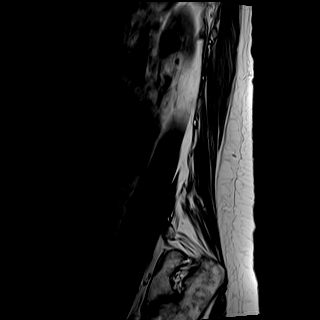

[Series 8: T2 · axial · 4.0mm · 0.70mm/px · z∈[-166,+25]mm · 9 of 33 slices shown (2 of 2)]
[im 1/33]
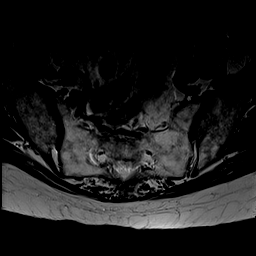
[im 6/33]
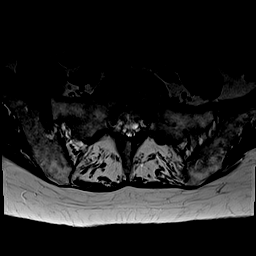
[im 11/33]
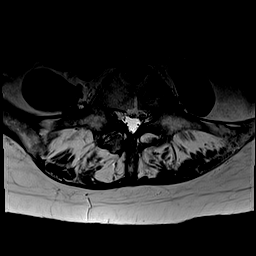
[im 14/33]
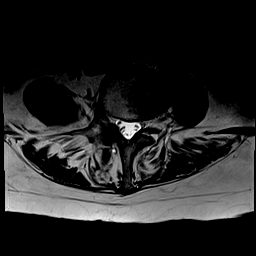
[im 17/33]
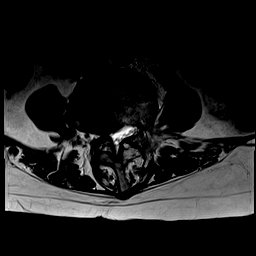
[im 19/33]
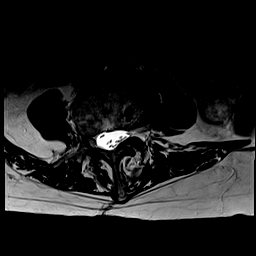
[im 22/33]
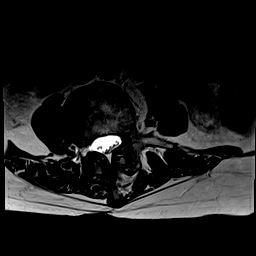
[im 27/33]
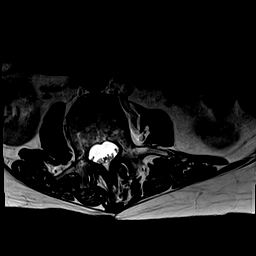
[im 33/33]
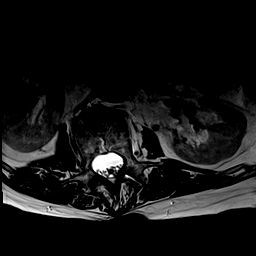

[Series 9: T1 · axial · 4.0mm · 0.35mm/px · z∈[-166,-11]mm · 7 of 33 slices shown (2 of 2)]
[im 1/33]
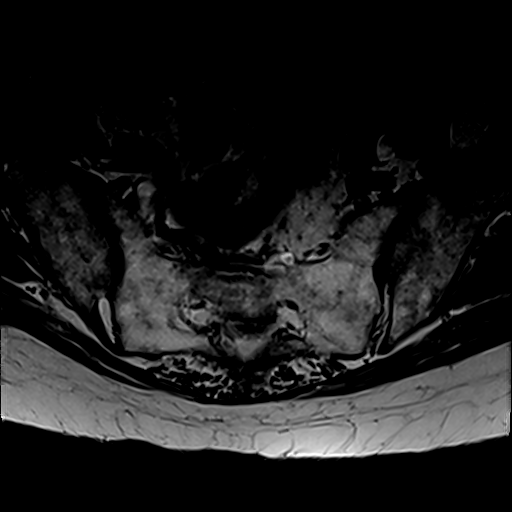
[im 6/33]
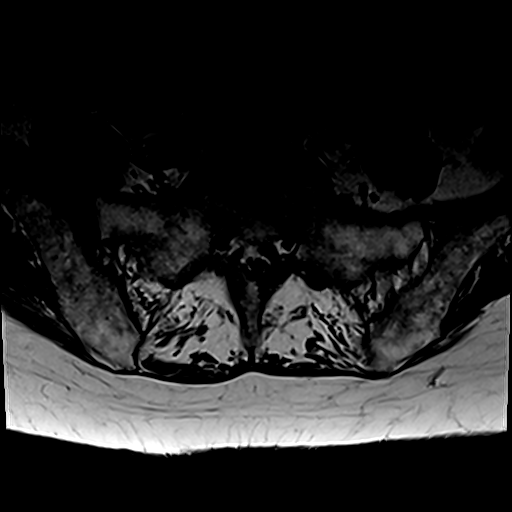
[im 11/33]
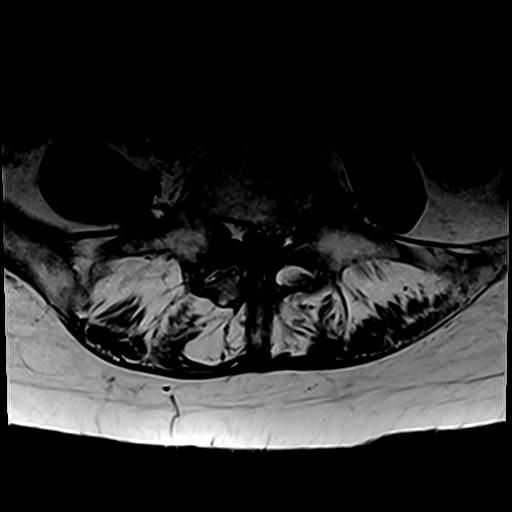
[im 14/33]
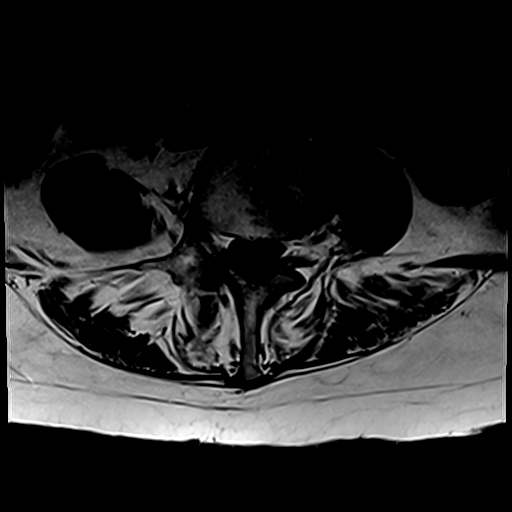
[im 17/33]
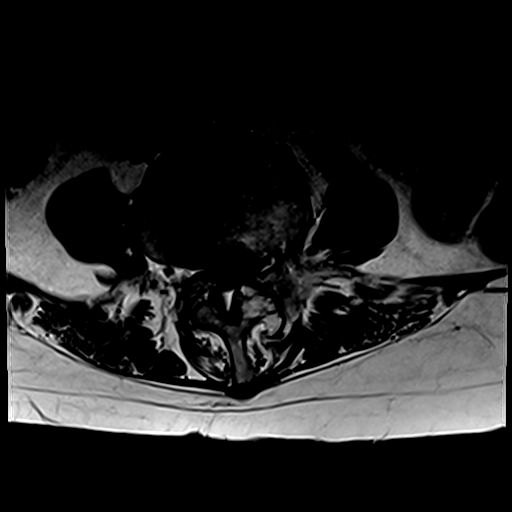
[im 19/33]
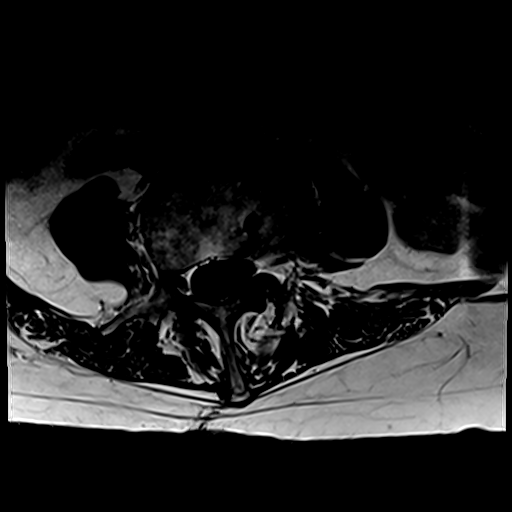
[im 27/33]
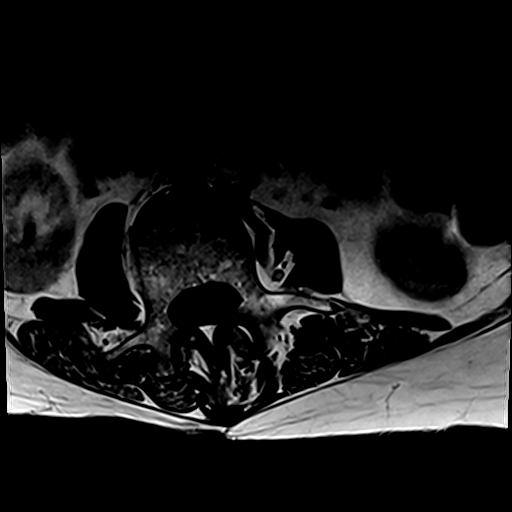

[31 of 48 positions shown; findings below may reference images not displayed]

FINDINGS: Segmentation: 6 non rib-bearing lumbar vertebral bodies on prior
radiographs. The inferior-most fully formed intervertebral disc is
labeled L5-S1.

Alignment: Rotatory dextrocurvature, centered at L3. Slight
retrolisthesis of L3 on L4.

Vertebrae: No focal marrow edema to suggest acute fracture or
discitis/osteomyelitis. Heterogeneous bone marrow without focal
suspicious lesion chronic appearing right eccentric
degenerative/discogenic endplate signal changes at L4-L5.

Conus medullaris and cauda equina: Conus extends to the L1 level.
Conus appears normal.

Paraspinal and other soft tissues: No appreciable paraspinal edema.

Disc levels:

T12-L1: Only imaged sagittally without evidence of significant canal
or foraminal stenosis. Right eccentric disc bulge.

L1-L2: Mild disc bulging and mild right greater than left facet
hypertrophy. No significant canal or foraminal stenosis.

L2-L3: Mild disc bulging and mild right greater than left facet
hypertrophy. No significant canal or foraminal stenosis.

L3-L4: Left eccentric degenerative disc height loss with L3 inferior
endplate Schmorl's node. Mild disc bulging and mild bilateral facet
hypertrophy with mild central canal stenosis. Moderate to severe
right subarticular recess stenosis with potential impingement of
descending right L4 nerve roots (series 8, image 17). Mild right
foraminal stenosis.

L4-L5: Right eccentric degenerative disc height loss with
surrounding chronic appearing endplate signal changes. Right
eccentric disc bulge and endplate spurring with moderate right facet
hypertrophy. Resulting moderate right subarticular recess stenosis
with potential impingement of descending right L5 nerve roots
(series 8, image 21). Mild right foraminal stenosis.

L5-S1: Broad disc bulge with superimposed small left far lateral
foraminal disc protrusion. Mild bilateral facet hypertrophy. No
significant canal or foraminal stenosis.
IMPRESSION: 1. At L3-L4, moderate to severe right subarticular recess stenosis
with potential impingement of the descending right L4 nerve roots.
Mild central canal and right foraminal stenosis.
2. At L4-L5, moderate right subarticular recess stenosis with
potential impingement of the descending right L5 nerve roots. Mild
right foraminal stenosis.
3. Dextrocurvature centered at L3.

## 2021-02-22 DIAGNOSIS — R269 Unspecified abnormalities of gait and mobility: Secondary | ICD-10-CM | POA: Diagnosis not present

## 2021-02-22 DIAGNOSIS — R531 Weakness: Secondary | ICD-10-CM | POA: Diagnosis not present

## 2021-02-23 LAB — URINE CULTURE

## 2021-02-25 NOTE — Telephone Encounter (Signed)
Spoke with patient and advised ok to switch to Priscilla Daniels as PCP. Patient states that she has a lot of appts right now with her back and will contact the office when she can to make an appt for follow up with [name].

## 2021-02-25 NOTE — Telephone Encounter (Signed)
That is fine 

## 2021-02-28 ENCOUNTER — Other Ambulatory Visit (HOSPITAL_COMMUNITY): Payer: Self-pay | Admitting: Neurosurgery

## 2021-02-28 ENCOUNTER — Other Ambulatory Visit: Payer: Self-pay | Admitting: Neurosurgery

## 2021-02-28 DIAGNOSIS — M542 Cervicalgia: Secondary | ICD-10-CM | POA: Diagnosis not present

## 2021-02-28 DIAGNOSIS — G959 Disease of spinal cord, unspecified: Secondary | ICD-10-CM

## 2021-03-03 ENCOUNTER — Ambulatory Visit: Payer: Medicare Other | Admitting: Physical Therapy

## 2021-03-05 ENCOUNTER — Other Ambulatory Visit: Payer: Self-pay | Admitting: Nurse Practitioner

## 2021-03-05 ENCOUNTER — Other Ambulatory Visit: Payer: Self-pay

## 2021-03-05 ENCOUNTER — Ambulatory Visit: Payer: Medicare Other | Attending: Nurse Practitioner | Admitting: Physical Therapy

## 2021-03-05 ENCOUNTER — Ambulatory Visit (INDEPENDENT_AMBULATORY_CARE_PROVIDER_SITE_OTHER): Payer: Medicare Other | Admitting: Family Medicine

## 2021-03-05 ENCOUNTER — Encounter: Payer: Self-pay | Admitting: Family Medicine

## 2021-03-05 ENCOUNTER — Encounter: Payer: Self-pay | Admitting: Physical Therapy

## 2021-03-05 DIAGNOSIS — M4316 Spondylolisthesis, lumbar region: Secondary | ICD-10-CM

## 2021-03-05 DIAGNOSIS — M1990 Unspecified osteoarthritis, unspecified site: Secondary | ICD-10-CM | POA: Diagnosis not present

## 2021-03-05 DIAGNOSIS — R2681 Unsteadiness on feet: Secondary | ICD-10-CM | POA: Diagnosis not present

## 2021-03-05 DIAGNOSIS — M542 Cervicalgia: Secondary | ICD-10-CM

## 2021-03-05 DIAGNOSIS — M6281 Muscle weakness (generalized): Secondary | ICD-10-CM | POA: Insufficient documentation

## 2021-03-05 NOTE — Progress Notes (Signed)
Virtual Visit via Telephone Note  I connected with [name] on 03/05/21 at 2:43 PM by telephone and verified that I am speaking with the correct person using two identifiers. Tarini NIESHIA LARMON is currently located at the store and her daughter is currently with her during this visit. The provider, Gwenlyn Fudge, FNP is located in their office at time of visit.  I discussed the limitations, risks, security and privacy concerns of performing an evaluation and management service by telephone and the availability of in person appointments. I also discussed with the patient that there may be a patient responsible charge related to this service. The patient expressed understanding and agreed to proceed.  Subjective: PCP: Gwenlyn Fudge, FNP  Chief Complaint  Patient presents with   Pain   Patient is confused about her medications. She has prescriptions for both meloxicam and diclofenac and is not sure which one she is suppose to take. She has been taking both every day.   ROS: Per HPI  Current Outpatient Medications:    baclofen (LIORESAL) 10 MG tablet, Take 10 mg by mouth 3 (three) times daily., Disp: , Rfl:    CRANBERRY PO, Take by mouth., Disp: , Rfl:    diclofenac (VOLTAREN) 75 MG EC tablet, TAKE ONE TABLET BY MOUTH TWICE DAILY, Disp: 60 tablet, Rfl: 2   dorzolamide (TRUSOPT) 2 % ophthalmic solution, 2 (two) times daily., Disp: , Rfl:    lisinopril-hydrochlorothiazide (ZESTORETIC) 20-12.5 MG tablet, TAKE ONE (1) TABLET EACH DAY, Disp: 90 tablet, Rfl: 1   meloxicam (MOBIC) 15 MG tablet, TAKE ONE (1) TABLET BY MOUTH EVERY DAY, Disp: 30 tablet, Rfl: 1   predniSONE (STERAPRED UNI-PAK 21 TAB) 10 MG (21) TBPK tablet, As directed x 6 days, Disp: 21 tablet, Rfl: 0   rosuvastatin (CRESTOR) 10 MG tablet, TAKE ONE (1) TABLET BY MOUTH EVERY DAY, Disp: 90 tablet, Rfl: 0   TRAVATAN Z 0.004 % SOLN ophthalmic solution, , Disp: , Rfl:    VITAMIN D PO, Take by mouth., Disp: , Rfl:    Allergies  Allergen Reactions   Lipitor [Atorvastatin Calcium]    Septra [Bactrim]    Sulfa Antibiotics    Past Medical History:  Diagnosis Date   Abnormal vaginal Pap smear    Candidiasis    cuteneous    Chronic UTI    COPD (chronic obstructive pulmonary disease) (HCC) 2010   Dx by CXR    DM (diabetes mellitus) (HCC)    Fatigue    Glaucoma    Hemorrhage in optic nerve sheath of left eye 01/23/2020   Hyperlipidemia    Diet Controlled    NIDDM (non-insulin dependent diabetes mellitus)    Phlebitis    Strep pharyngitis    Vaginal atrophy    Vertigo     Observations/Objective: A&O  No respiratory distress or wheezing audible over the phone Mood, judgement, and thought processes all WNL   Assessment and Plan: 1-2. Spondylolisthesis of lumbar region/Arthritis Advised to only take meloxicam and that if they needed something else for pain to take Tylenol. Plan to assess kidney function labs next week at her scheduled in-person appointment.    Follow Up Instructions: Return as scheduled.  I discussed the assessment and treatment plan with the patient. The patient was provided an opportunity to ask questions and all were answered. The patient agreed with the plan and demonstrated an understanding of the instructions.   The patient was advised to call back or seek an in-person  evaluation if the symptoms worsen or if the condition fails to improve as anticipated.  The above assessment and management plan was discussed with the patient. The patient verbalized understanding of and has agreed to the management plan. Patient is aware to call the clinic if symptoms persist or worsen. Patient is aware when to return to the clinic for a follow-up visit. Patient educated on when it is appropriate to go to the emergency department.   Time call ended: 2:54 PM  I provided 11 minutes of non-face-to-face time during this encounter.  Deliah Boston, MSN, APRN, FNP-C [facility] 03/05/21

## 2021-03-05 NOTE — Telephone Encounter (Signed)
Has an appointment this afternoon to discuss.

## 2021-03-05 NOTE — Therapy (Addendum)
[facility] [place], [place], [address] Phone: [phone]   Fax:  [phone]  Physical Therapy Treatment  Patient Details  Name: Priscilla Daniels MRN: [id number] Date of Birth: [birth year] Referring Provider (PT): Ronnald Collum   Encounter Date: 03/05/2021   PT End of Session - 03/05/21 1310     Visit Number 8    Number of Visits 8    Date for PT Re-Evaluation 02/26/21    PT Start Time 9485    PT Stop Time 1344    PT Time Calculation (min) 42 min    Equipment Utilized During Treatment Other (comment)   FWW   Activity Tolerance Patient tolerated treatment well;Patient limited by fatigue    Behavior During Therapy [facility] for tasks assessed/performed             Past Medical History:  Diagnosis Date   Abnormal vaginal Pap smear    Candidiasis    cuteneous    Chronic UTI    COPD (chronic obstructive pulmonary disease) ([place]) 2010   Dx by CXR    DM (diabetes mellitus) ([place])    Fatigue    Glaucoma    Hemorrhage in optic nerve sheath of left eye 01/23/2020   Hyperlipidemia    Diet Controlled    NIDDM (non-insulin dependent diabetes mellitus)    Phlebitis    Strep pharyngitis    Vaginal atrophy    Vertigo     Past Surgical History:  Procedure Laterality Date   CERVICAL CONE BIOPSY     GLAUCOMA SURGERY  12/04 & 07/05/03   left eye cataract surgery  01/29/2009   right eye cataract surgery  01/13/09   VARICOSE VEIN SURGERY     both legs     There were no vitals filed for this visit.   Subjective Assessment - 03/05/21 1306     Subjective COVID-19 screen performed prior to patient entering clinic. Patient and daughter arrived and reports that she was taking two medications that were interfering per new PCP. Has stopped one med and daughter states that she is already walking better. States that she is to have new MRIs on her head and neck next week.    Patient is accompained by: Family member   Daughter, Priscilla Daniels    Pertinent History COPD, DM, scoliosis.    How long can you walk comfortably? Around home with a FWW.    Diagnostic tests X-ray.    Patient Stated Goals Walk and get around better.  Feel good, cook, stand better and drive.    Currently in Pain? No/denies                [facility] PT Assessment - 03/05/21 0001       Assessment   Medical Diagnosis Arthralgia    Referring Provider (PT) Ronnald Collum      Precautions   Precaution Comments HIGH FALL RISK.      Restrictions   Weight Bearing Restrictions No                           [facility] Adult PT Treatment/Exercise - 03/05/21 0001       Knee/Hip Exercises: Aerobic   Nustep L2 x16 min      Knee/Hip Exercises: Seated   Long Arc Quad Strengthening;Both;3 sets;10 reps    Long Arc Quad Weight 3 lbs.    Ball Squeeze x15 reps    Clamshell with TheraBand Yellow  x20 reps   Other Seated Knee/Hip Exercises Seated core hug with ball x15 reps    Marching Strengthening;Both;2 sets;10 reps    Marching Limitations yellow theraband                          PT Long Term Goals - 03/05/21 1409       PT LONG TERM GOAL #1   Title Independent with a HEP.    Baseline Doing well with HEP 02/12/21    Time 4    Period Weeks    Status Achieved      PT LONG TERM GOAL #2   Title Increase bilateral hip strength by a solid muscle strength grade to increase stability for functional tasks.    Time 4    Period Weeks    Status Unable to assess      PT LONG TERM GOAL #3   Title Perform 4 stairs with one railing with a reciprocating fashion.    Baseline limited with stairs at this time 02/19/21    Time 4    Period Weeks    Status Unable to assess      PT LONG TERM GOAL #4   Title Walk 250 feet in clinic in a non step-to fashion.    Baseline step to gait is ongoing 02/19/21    Time 4    Period Weeks    Status Not Met                   Plan - 03/05/21 1359     Clinical Impression Statement Patient  presented in clinic with reports of feeling and walking better after stopping a medication that she was just recently told interfered with another medication. Patient able to tolerate therex fairly well but continues to fatigue quickly with exercise. Patient and daughter report they are diligent with safety and does HEP consistently and only the standing exercises when her daughter is home. Ultimate goal is to be able to go up four stairs to get into her granddaughter's home and see her great grandson. Walker height was also increased to avoid spinal flexion during gait. Patient observed with continued step to pattern with FWW.    Personal Factors and Comorbidities Comorbidity 1;Comorbidity 2;Other    Comorbidities COPD, DM, scoliosis.    Examination-Activity Limitations Other;Transfers;Locomotion Level;Stairs    Examination-Participation Restrictions Other    Stability/Clinical Decision Making Evolving/Moderate complexity    Rehab Potential Good    PT Frequency 2x / week    PT Duration 4 weeks    PT Treatment/Interventions ADLs/Self Care Home Management;Gait training;Stair training;Functional mobility training;Therapeutic activities;Therapeutic exercise;Balance training;Neuromuscular re-education;Manual techniques;Patient/family education    PT Next Visit Plan D/C. New order to be sent after MRIs.    Consulted and Agree with Plan of Care Patient;Family member/caregiver    Family Member Consulted Daughter, Priscilla Daniels             Patient will benefit from skilled therapeutic intervention in order to improve the following deficits and impairments:  Abnormal gait, Difficulty walking, Decreased activity tolerance, Decreased balance, Decreased mobility, Decreased strength  Visit Diagnosis: Unsteadiness on feet  Muscle weakness (generalized)     Problem List Patient Active Problem List   Diagnosis Date Noted   Dysuria 09/12/2020   Fatigue 09/12/2020   Posterior vitreous detachment, both eyes  07/30/2020   Central retinal vein occlusion of left eye 01/23/2020   Intermediate stage nonexudative age-related macular degeneration of both  eyes 01/23/2020   Primary open angle glaucoma of both eyes, moderate stage 01/23/2020   Retinal hemorrhage of left eye 73/71/0626   Metabolic syndrome 94/85/4627   CKD (chronic kidney disease), stage III ([place]) 02/11/2015   Atherosclerosis of aorta ([place]) 02/08/2015   Glaucoma (increased eye pressure) 09/30/2012   Hyperlipidemia with target LDL less than 100 09/30/2012   Essential hypertension 09/30/2012    Standley Brooking, PTA 03/05/21 2:11 PM   [facility] [place], [place], [address] Phone: [phone]   Fax:  [phone]  Name: FRANCESCA STROME MRN: [id number] Date of Birth: [birth year]   PHYSICAL THERAPY DISCHARGE SUMMARY  Visits from Start of Care: 8.  Current functional level related to goals / functional outcomes: See above.   Remaining deficits: See goal section.   Education / Equipment: HEP.   Patient agrees to discharge. Patient goals were partially met. Patient is being discharged due to being pleased with the current functional level.    Mali Applegate MPT

## 2021-03-11 ENCOUNTER — Ambulatory Visit (HOSPITAL_COMMUNITY)
Admission: RE | Admit: 2021-03-11 | Discharge: 2021-03-11 | Disposition: A | Discharge status: Home / Self Care | Payer: Medicare Other | Source: Ambulatory Visit | Attending: Neurosurgery | Admitting: Neurosurgery

## 2021-03-11 ENCOUNTER — Other Ambulatory Visit: Payer: Self-pay

## 2021-03-11 DIAGNOSIS — G959 Disease of spinal cord, unspecified: Secondary | ICD-10-CM | POA: Diagnosis not present

## 2021-03-11 DIAGNOSIS — M50223 Other cervical disc displacement at C6-C7 level: Secondary | ICD-10-CM | POA: Diagnosis not present

## 2021-03-11 DIAGNOSIS — M5127 Other intervertebral disc displacement, lumbosacral region: Secondary | ICD-10-CM | POA: Diagnosis not present

## 2021-03-11 IMAGING — MR MR CERVICAL SPINE W/O CM
5 series · 34 of 48 positions shown · non-contrast
Comparison: No prior MRI of the cervical or thoracic spine,
correlation is made with MRI lumbar spine [DATE]

CLINICAL DATA: Neck pain and back pain, radiating down both legs

EXAM:
MRI CERVICAL AND THORACIC SPINE WITHOUT CONTRAST
TECHNIQUE: Multiplanar and multiecho pulse sequences of the cervical spine, to
include the craniocervical junction and cervicothoracic junction,
and the thoracic spine, were obtained without intravenous contrast.

[Series 16: T2 · sagittal · 3.0mm · 0.69mm/px · 7 of 15 slices shown (1 of 2)]
[im 1/15]
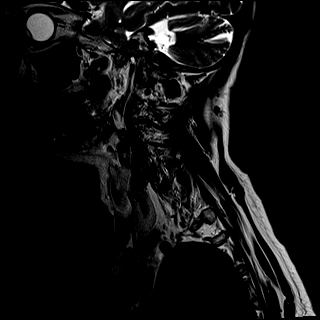
[im 3/15]
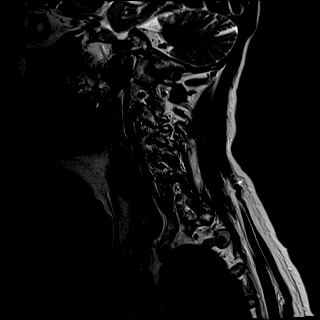
[im 5/15]
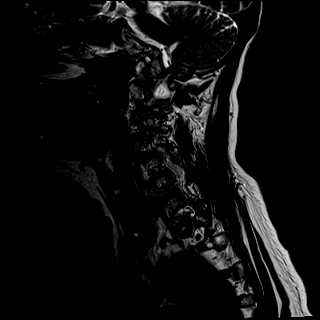
[im 8/15]
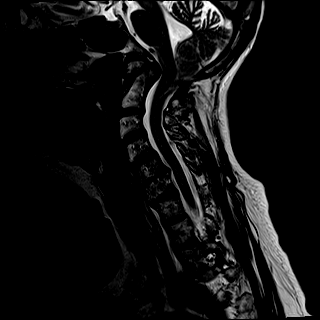
[im 10/15]
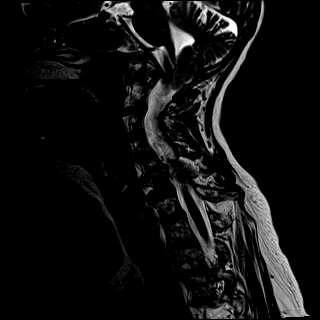
[im 12/15]
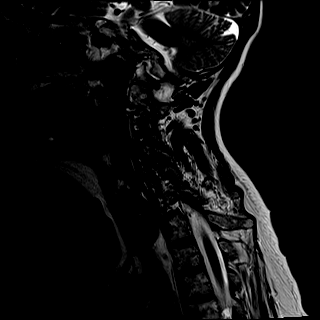
[im 15/15]
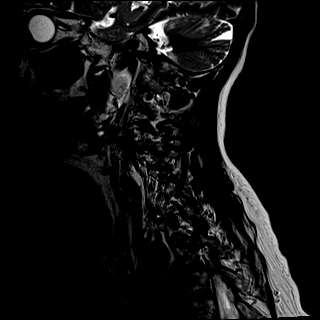

[Series 17: T1 · sagittal · 3.0mm · 0.69mm/px · 6 of 15 slices shown]
[im 1/15]
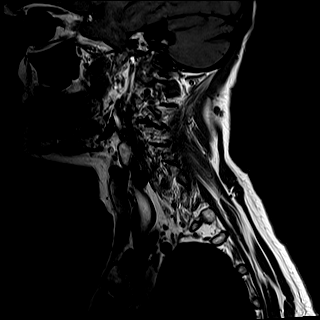
[im 3/15]
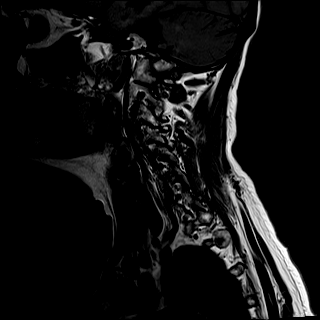
[im 6/15]
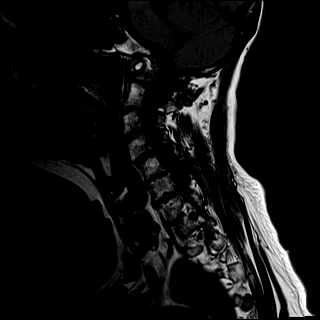
[im 9/15]
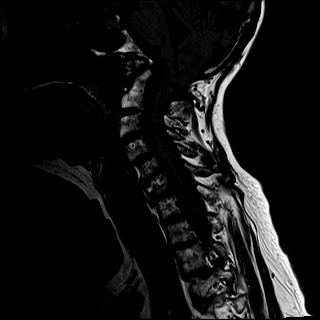
[im 12/15]
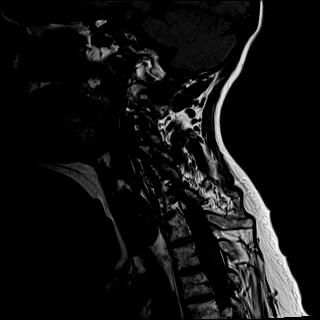
[im 15/15]
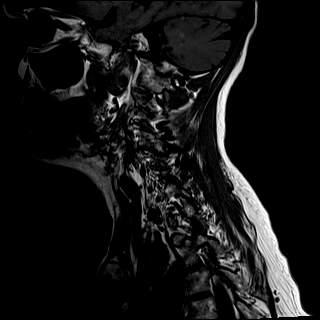

[Series 18: STIR · sagittal · 3.0mm · 0.86mm/px · 6 of 15 slices shown]
[im 1/15]
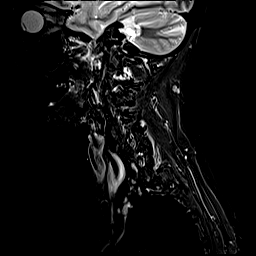
[im 3/15]
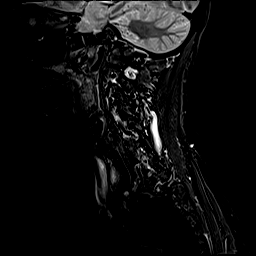
[im 6/15]
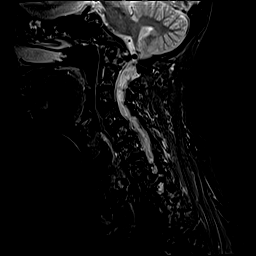
[im 9/15]
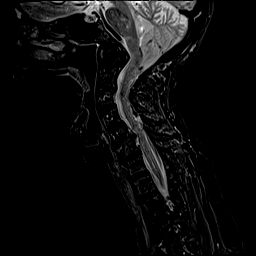
[im 12/15]
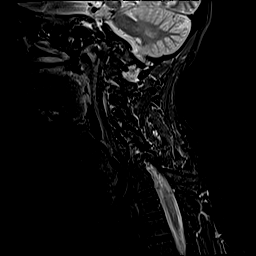
[im 15/15]
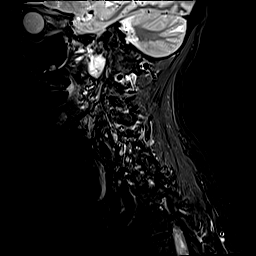

[Series 19: T2 · axial · 3.0mm · 0.66mm/px · z∈[-67,+29]mm · 8 of 35 slices shown (2 of 2)]
[im 1/35]
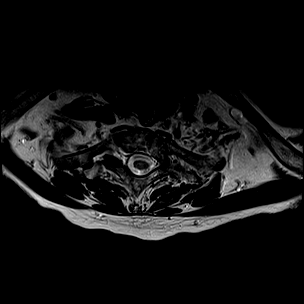
[im 6/35]
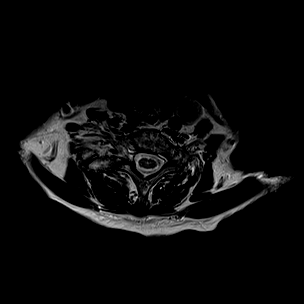
[im 11/35]
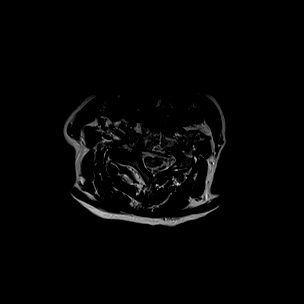
[im 16/35]
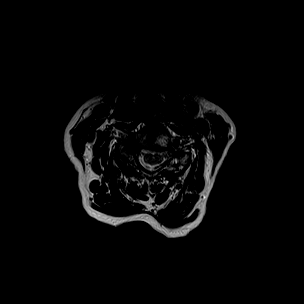
[im 19/35]
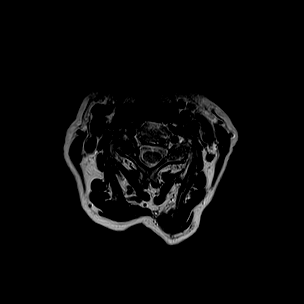
[im 24/35]
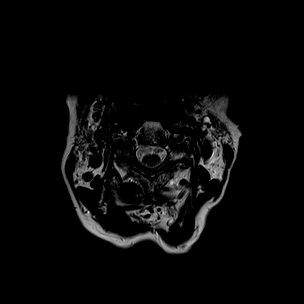
[im 29/35]
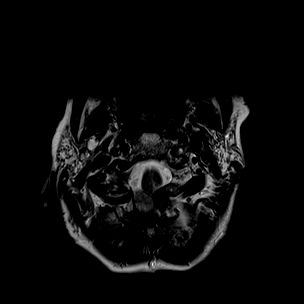
[im 35/35]
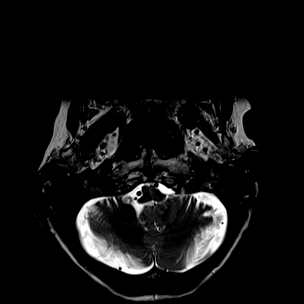

[Series 20: GRE · axial · 3.0mm · 0.35mm/px · z∈[-62,+21]mm · 7 of 36 slices shown]
[im 1/36]
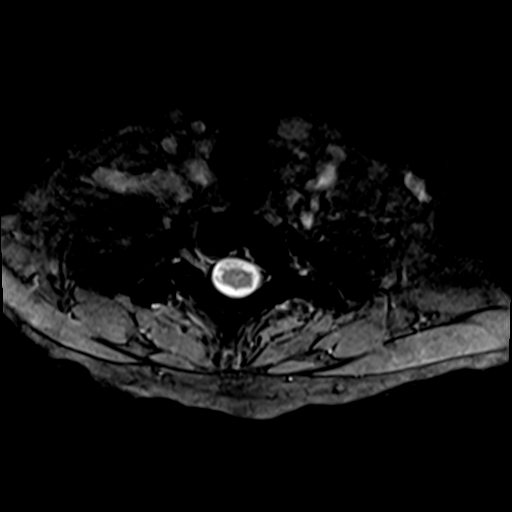
[im 6/36]
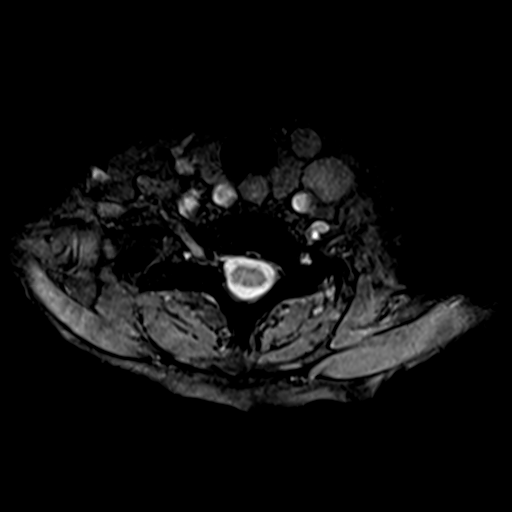
[im 11/36]
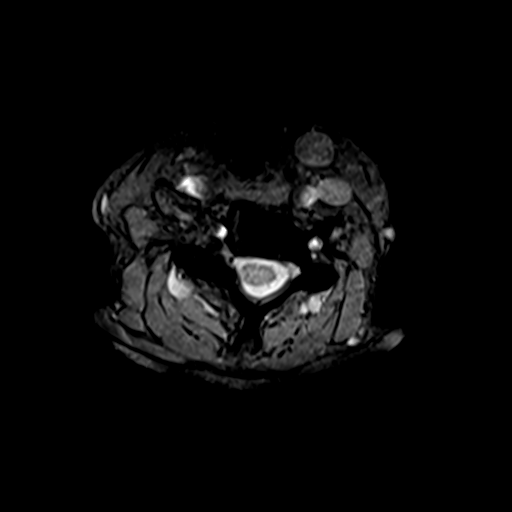
[im 16/36]
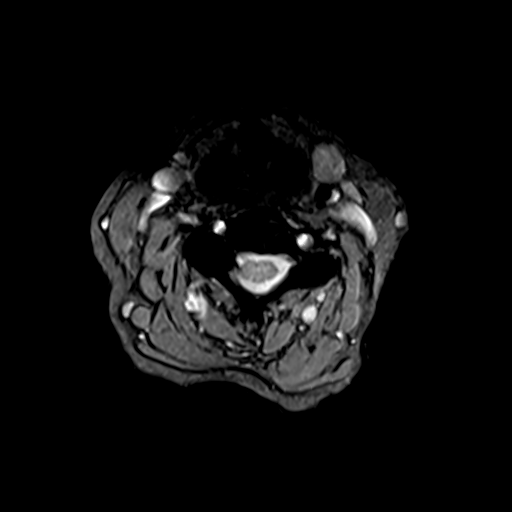
[im 21/36]
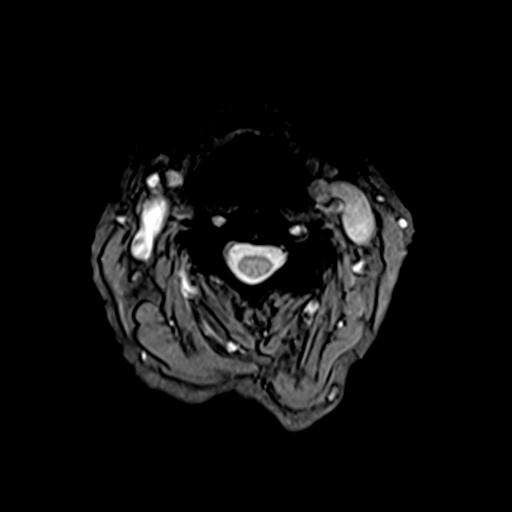
[im 26/36]
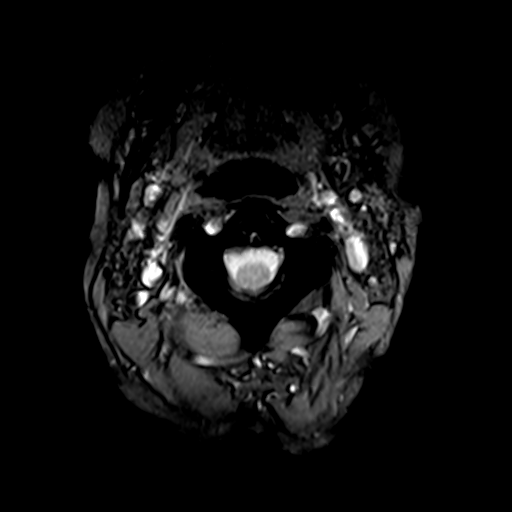
[im 31/36]
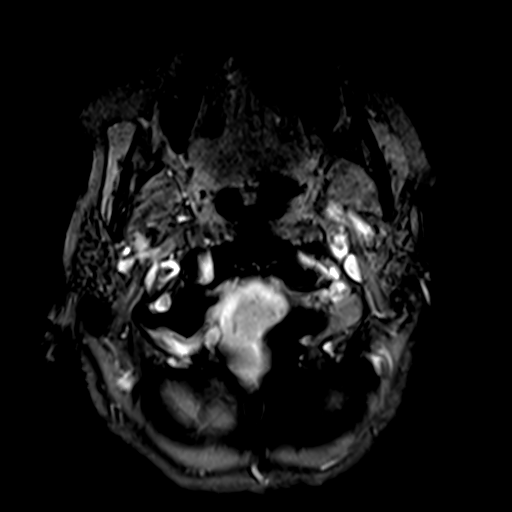

[34 of 48 positions shown; findings below may reference images not displayed]

FINDINGS: MRI CERVICAL SPINE FINDINGS

Alignment: S shaped curvature. 2 mm anterolisthesis C4 on C5. Mild
exaggeration of the normal cervical kyphosis C2-C4 and mild
straightening C5-T1.

Vertebrae: Heterogeneous marrow signal. No focal edema to suggest
acute fracture. No suspicious osseous lesion. Endplate degenerative
changes at C5-C6. Incomplete fusion of the anterior and posterior
arch of C1.

Cord: Normal signal and morphology.

Posterior Fossa, vertebral arteries, paraspinal tissues:
Approximately 12 mm extension of the cerebellar tonsils through the
foramen magnum.

Disc levels:

C2-C3: Mild disc bulge with right greater than left facet and
uncovertebral hypertrophy. No spinal canal stenosis. Severe
bilateral neural foraminal narrowing.

C3-C4: Mild disc bulge. Left-greater-than-right facet arthropathy
and uncovertebral hypertrophy. No spinal canal stenosis. No neural
foraminal narrowing.

C4-C5: 2 mm anterolisthesis and disc unroofing. Facet and
uncovertebral hypertrophy. No spinal canal stenosis. Mild right
neural foraminal narrowing.

C5-C6: Disc height loss with disc osteophyte complex. Right greater
than left facet and uncovertebral hypertrophy. No spinal canal
stenosis. Mild bilateral neural foraminal narrowing.

C6-C7: Mild disc bulge. Facet and uncovertebral hypertrophy. No
spinal canal stenosis. Mild left neural foraminal narrowing.

C7-T1: No significant disc bulge. Facet arthropathy. No spinal canal
stenosis or neural foraminal narrowing.

MRI THORACIC SPINE FINDINGS

Alignment: S shaped curvature of the thoracolumbar spine. No
listhesis.

Vertebrae: Heterogeneous marrow signal. No focal edema to suggest
acute fracture. No suspicious osseous lesion.

Cord:  Normal signal and morphology.

Paraspinal and other soft tissues: Negative.

Disc levels:

T6-T7: Central disc bulge, which indents the thecal sac. No spinal
canal stenosis or neural foraminal narrowing.

T8-T9: Right paracentral disc protrusion, which contacts the ventral
cord. No spinal canal stenosis or neural foraminal narrowing.

T11-T12: Mild disc bulge. No spinal canal stenosis or neural
foraminal narrowing.
IMPRESSION: 1. C2-C3 severe bilateral neural foraminal narrowing.
2. Mild neural foraminal narrowing on the right at C4-C5,
bilaterally at C5-C6, and on the left at C6-C7. No spinal canal
stenosis in the cervical spine.
3. No spinal canal stenosis or significant neural foraminal
narrowing in the thoracic spine.
4. Scoliosis.
5. 11 mm extension of the cerebellar tonsils through the foramen
magnum, concerning for Chiari malformation. No evidence of syrinx.
Recommend MRI of the brain for further evaluation.

These results will be called to the ordering clinician or
representative by the Radiologist Assistant, and communication
documented in the PACS or [REDACTED].

## 2021-03-11 IMAGING — MR MR THORACIC SPINE W/O CM
4 of 6 series · 19 of 48 positions shown · non-contrast
Comparison: No prior MRI of the cervical or thoracic spine,
correlation is made with MRI lumbar spine [DATE]

CLINICAL DATA: Neck pain and back pain, radiating down both legs

EXAM:
MRI CERVICAL AND THORACIC SPINE WITHOUT CONTRAST
TECHNIQUE: Multiplanar and multiecho pulse sequences of the cervical spine, to
include the craniocervical junction and cervicothoracic junction,
and the thoracic spine, were obtained without intravenous contrast.

[Series 100: T1 · 3 of 19 slices shown (1 of 2)]
[im 4/19]
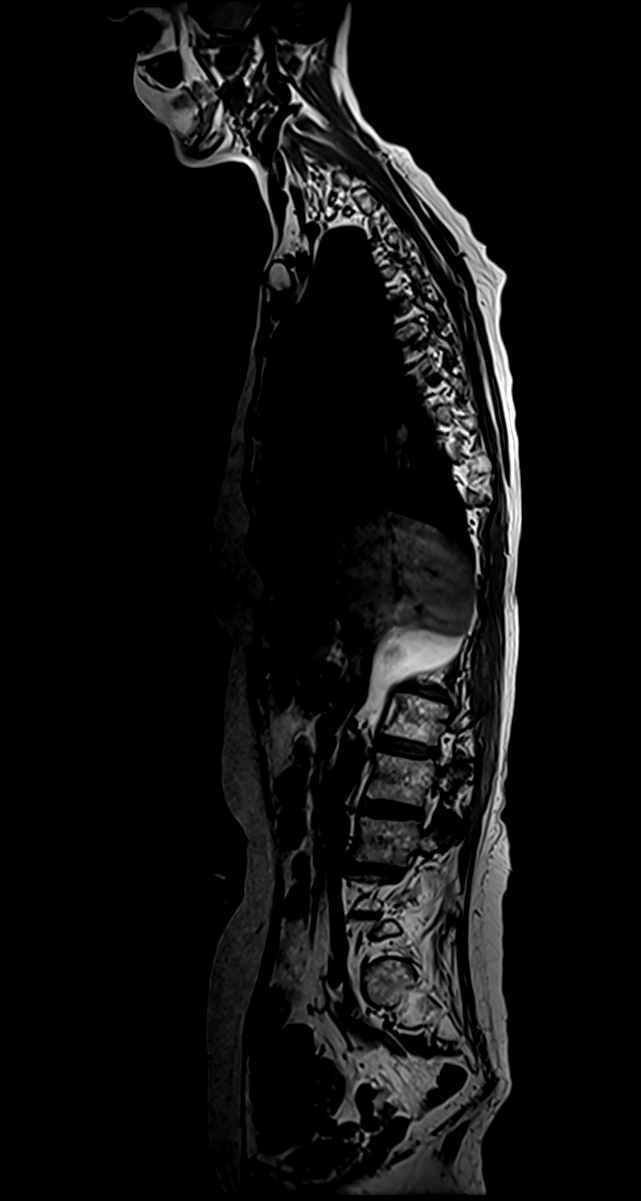
[im 11/19]
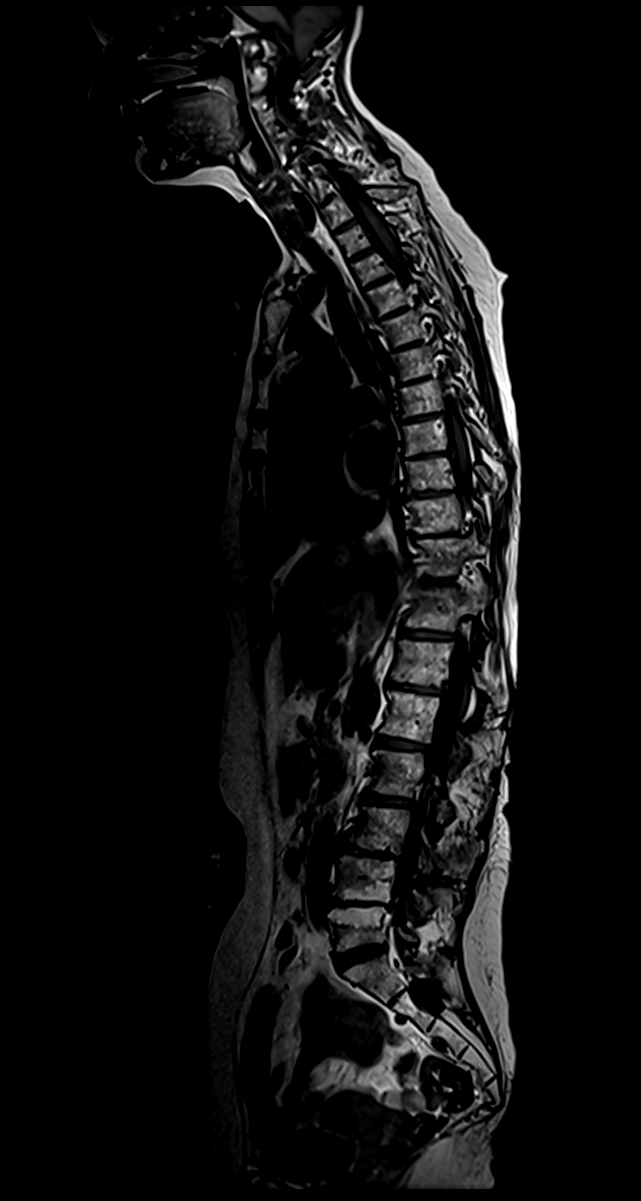
[im 19/19]
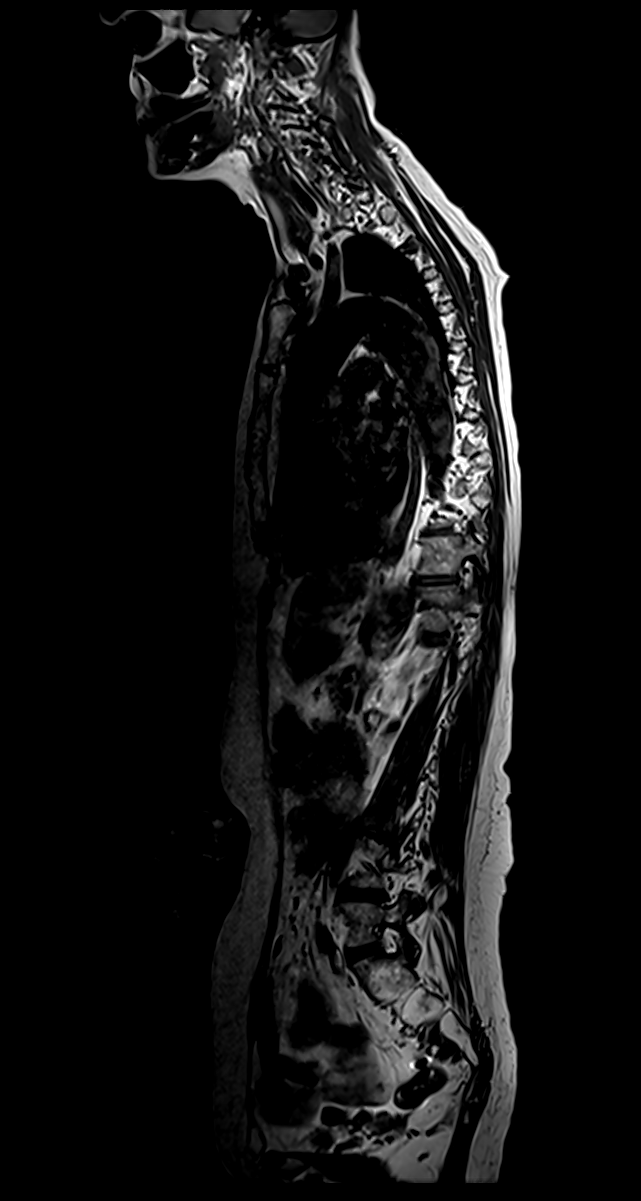

[Series 101: T2 · sagittal · 3.0mm · 0.76mm/px · 6 of 20 slices shown (1 of 2)]
[im 1/20]
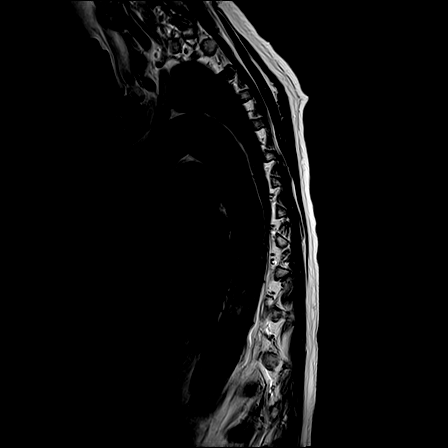
[im 4/20]
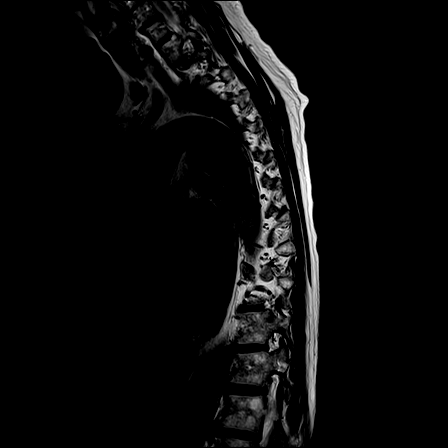
[im 8/20]
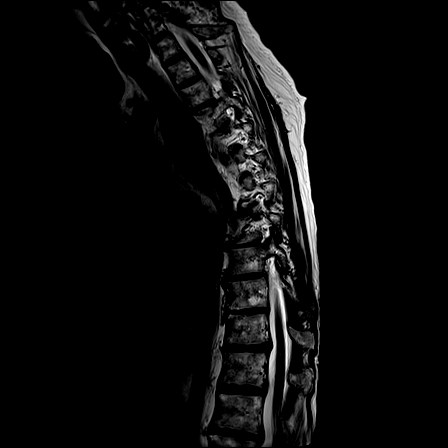
[im 12/20]
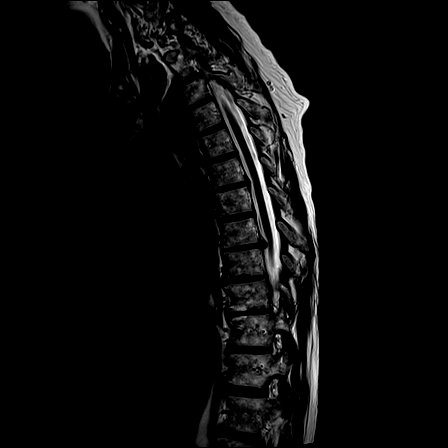
[im 16/20]
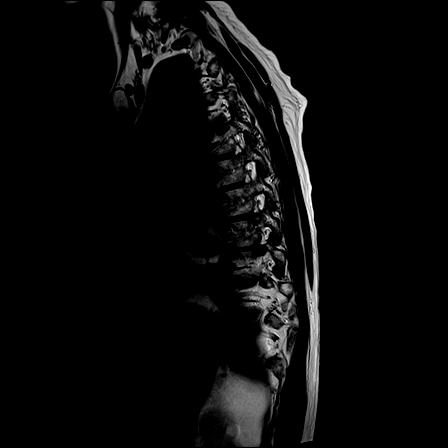
[im 20/20]
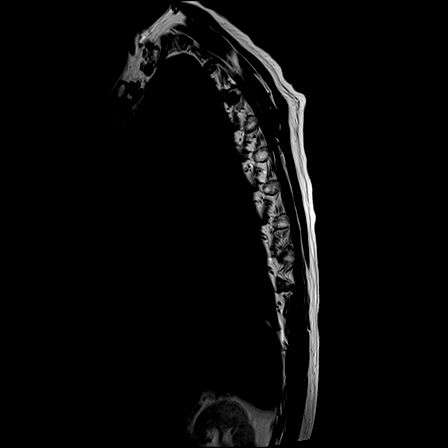

[Series 102: T1 · sagittal · 3.0mm · 0.79mm/px · 3 of 20 slices shown (2 of 2)]
[im 4/20]
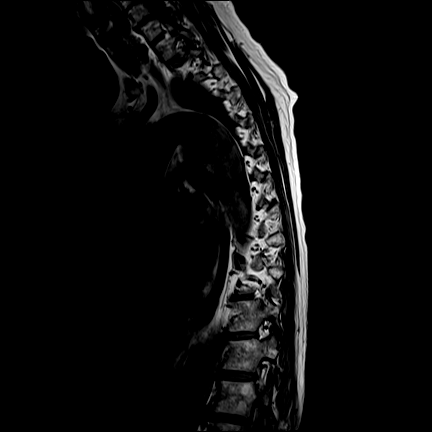
[im 12/20]
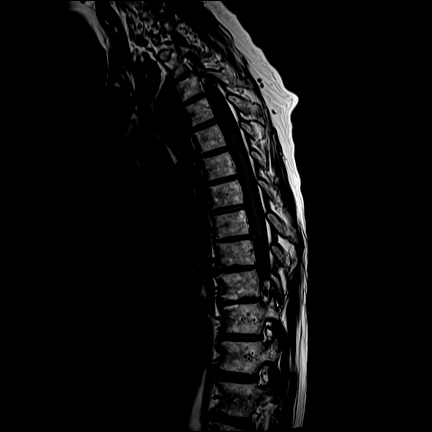
[im 20/20]
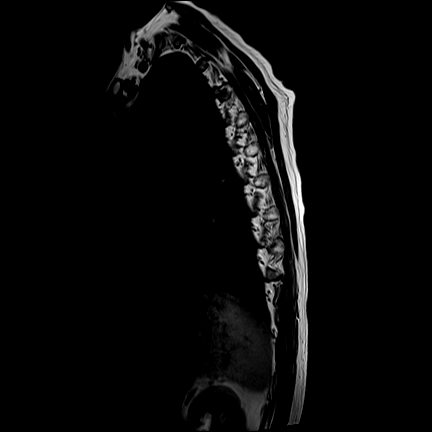

[Series 104: T2 · axial · 5.0mm · 0.59mm/px · z∈[-307,-58]mm · 7 of 39 slices shown (2 of 2)]
[im 1/39]
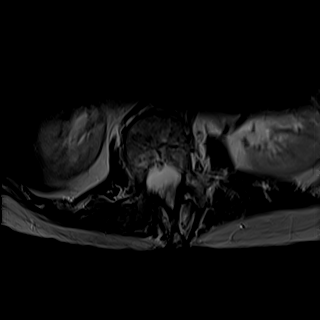
[im 7/39]
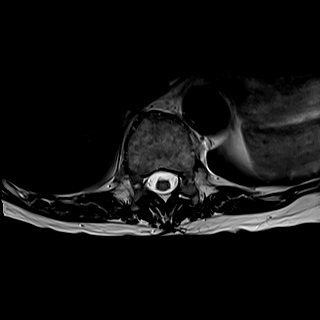
[im 11/39]
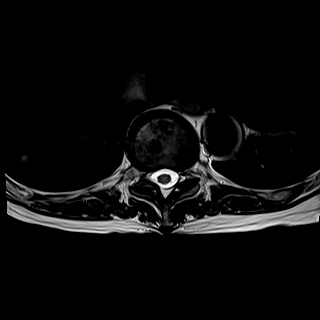
[im 18/39]
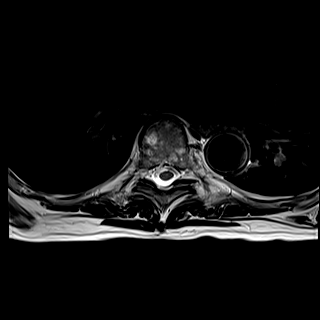
[im 21/39]
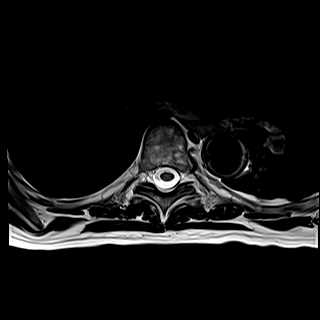
[im 28/39]
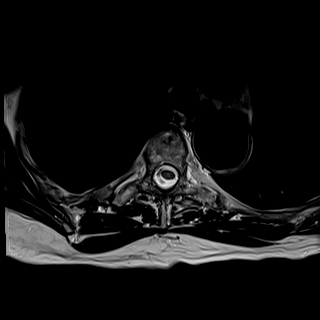
[im 35/39]
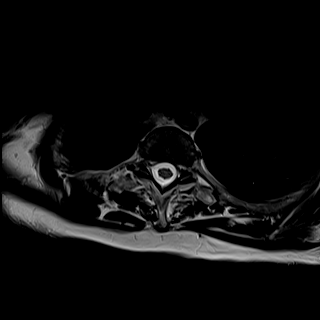

[19 of 48 positions shown; findings below may reference images not displayed]

FINDINGS: MRI CERVICAL SPINE FINDINGS

Alignment: S shaped curvature. 2 mm anterolisthesis C4 on C5. Mild
exaggeration of the normal cervical kyphosis C2-C4 and mild
straightening C5-T1.

Vertebrae: Heterogeneous marrow signal. No focal edema to suggest
acute fracture. No suspicious osseous lesion. Endplate degenerative
changes at C5-C6. Incomplete fusion of the anterior and posterior
arch of C1.

Cord: Normal signal and morphology.

Posterior Fossa, vertebral arteries, paraspinal tissues:
Approximately 12 mm extension of the cerebellar tonsils through the
foramen magnum.

Disc levels:

C2-C3: Mild disc bulge with right greater than left facet and
uncovertebral hypertrophy. No spinal canal stenosis. Severe
bilateral neural foraminal narrowing.

C3-C4: Mild disc bulge. Left-greater-than-right facet arthropathy
and uncovertebral hypertrophy. No spinal canal stenosis. No neural
foraminal narrowing.

C4-C5: 2 mm anterolisthesis and disc unroofing. Facet and
uncovertebral hypertrophy. No spinal canal stenosis. Mild right
neural foraminal narrowing.

C5-C6: Disc height loss with disc osteophyte complex. Right greater
than left facet and uncovertebral hypertrophy. No spinal canal
stenosis. Mild bilateral neural foraminal narrowing.

C6-C7: Mild disc bulge. Facet and uncovertebral hypertrophy. No
spinal canal stenosis. Mild left neural foraminal narrowing.

C7-T1: No significant disc bulge. Facet arthropathy. No spinal canal
stenosis or neural foraminal narrowing.

MRI THORACIC SPINE FINDINGS

Alignment: S shaped curvature of the thoracolumbar spine. No
listhesis.

Vertebrae: Heterogeneous marrow signal. No focal edema to suggest
acute fracture. No suspicious osseous lesion.

Cord:  Normal signal and morphology.

Paraspinal and other soft tissues: Negative.

Disc levels:

T6-T7: Central disc bulge, which indents the thecal sac. No spinal
canal stenosis or neural foraminal narrowing.

T8-T9: Right paracentral disc protrusion, which contacts the ventral
cord. No spinal canal stenosis or neural foraminal narrowing.

T11-T12: Mild disc bulge. No spinal canal stenosis or neural
foraminal narrowing.
IMPRESSION: 1. C2-C3 severe bilateral neural foraminal narrowing.
2. Mild neural foraminal narrowing on the right at C4-C5,
bilaterally at C5-C6, and on the left at C6-C7. No spinal canal
stenosis in the cervical spine.
3. No spinal canal stenosis or significant neural foraminal
narrowing in the thoracic spine.
4. Scoliosis.
5. 11 mm extension of the cerebellar tonsils through the foramen
magnum, concerning for Chiari malformation. No evidence of syrinx.
Recommend MRI of the brain for further evaluation.

These results will be called to the ordering clinician or
representative by the Radiologist Assistant, and communication
documented in the PACS or [REDACTED].

## 2021-03-12 ENCOUNTER — Encounter: Payer: Self-pay | Admitting: Family Medicine

## 2021-03-12 ENCOUNTER — Ambulatory Visit (INDEPENDENT_AMBULATORY_CARE_PROVIDER_SITE_OTHER): Payer: Medicare Other | Admitting: Family Medicine

## 2021-03-12 ENCOUNTER — Telehealth: Payer: Self-pay | Admitting: Family Medicine

## 2021-03-12 ENCOUNTER — Ambulatory Visit (INDEPENDENT_AMBULATORY_CARE_PROVIDER_SITE_OTHER): Payer: Medicare Other

## 2021-03-12 VITALS — BP 128/69 | HR 59 | Temp 97.9°F

## 2021-03-12 DIAGNOSIS — M5441 Lumbago with sciatica, right side: Secondary | ICD-10-CM

## 2021-03-12 DIAGNOSIS — I700 Atherosclerosis of aorta: Secondary | ICD-10-CM

## 2021-03-12 DIAGNOSIS — E559 Vitamin D deficiency, unspecified: Secondary | ICD-10-CM | POA: Insufficient documentation

## 2021-03-12 DIAGNOSIS — E785 Hyperlipidemia, unspecified: Secondary | ICD-10-CM

## 2021-03-12 DIAGNOSIS — Z780 Asymptomatic menopausal state: Secondary | ICD-10-CM | POA: Diagnosis not present

## 2021-03-12 DIAGNOSIS — N1831 Chronic kidney disease, stage 3a: Secondary | ICD-10-CM | POA: Diagnosis not present

## 2021-03-12 DIAGNOSIS — M1990 Unspecified osteoarthritis, unspecified site: Secondary | ICD-10-CM | POA: Diagnosis not present

## 2021-03-12 DIAGNOSIS — R7303 Prediabetes: Secondary | ICD-10-CM | POA: Diagnosis not present

## 2021-03-12 DIAGNOSIS — F321 Major depressive disorder, single episode, moderate: Secondary | ICD-10-CM

## 2021-03-12 DIAGNOSIS — Z23 Encounter for immunization: Secondary | ICD-10-CM

## 2021-03-12 DIAGNOSIS — I10 Essential (primary) hypertension: Secondary | ICD-10-CM | POA: Diagnosis not present

## 2021-03-12 DIAGNOSIS — G8929 Other chronic pain: Secondary | ICD-10-CM | POA: Diagnosis not present

## 2021-03-12 DIAGNOSIS — M85852 Other specified disorders of bone density and structure, left thigh: Secondary | ICD-10-CM | POA: Diagnosis not present

## 2021-03-12 DIAGNOSIS — M85832 Other specified disorders of bone density and structure, left forearm: Secondary | ICD-10-CM | POA: Diagnosis not present

## 2021-03-12 DIAGNOSIS — M8580 Other specified disorders of bone density and structure, unspecified site: Secondary | ICD-10-CM

## 2021-03-12 HISTORY — DX: Other specified disorders of bone density and structure, unspecified site: M85.80

## 2021-03-12 LAB — BAYER DCA HB A1C WAIVED: HB A1C (BAYER DCA - WAIVED): 5.7 % — ABNORMAL HIGH (ref 4.8–5.6)

## 2021-03-12 MED ORDER — ROSUVASTATIN CALCIUM 10 MG PO TABS: ORAL_TABLET | ORAL | 1 refills | Status: DC

## 2021-03-12 MED ORDER — MELOXICAM 15 MG PO TABS: ORAL_TABLET | ORAL | 1 refills | Status: DC

## 2021-03-12 MED ORDER — LISINOPRIL-HYDROCHLOROTHIAZIDE 20-12.5 MG PO TABS: ORAL_TABLET | ORAL | 1 refills | Status: DC

## 2021-03-12 MED ORDER — TRAMADOL HCL 50 MG PO TABS: 50.0000 mg | ORAL_TABLET | Freq: Two times a day (BID) | ORAL | 2 refills | Status: DC | PRN

## 2021-03-12 NOTE — Progress Notes (Signed)
Assessment & Plan:  1. Essential hypertension - Well controlled on current regimen - lisinopril-hydrochlorothiazide (ZESTORETIC) 20-12.5 MG tablet; TAKE ONE (1) TABLET EACH DAY  Dispense: 90 tablet; Refill: 1 - CBC with Differential/Platelet - CMP14+EGFR - Lipid panel  2. Arthritis - meloxicam (MOBIC) 15 MG tablet; TAKE ONE (1) TABLET BY MOUTH EVERY DAY  Dispense: 90 tablet; Refill: 1 - CMP14+EGFR - traMADol (ULTRAM) 50 MG tablet; Take 1 tablet (50 mg total) by mouth 2 (two) times daily as needed.  Dispense: 60 tablet; Refill: 2  3. Atherosclerosis of aorta (HCC) - continue Rosuvastatin as prescribed - encouraged healthy diet and exercise as tolerated - rosuvastatin (CRESTOR) 10 MG tablet; TAKE ONE (1) TABLET BY MOUTH EVERY DAY  Dispense: 90 tablet; Refill: 1 - CMP14+EGFR - Lipid panel  4. Hyperlipidemia with target LDL less than 100 - labs to assess for improvement after starting rosuvastatin - continue Rosuvastatin as prescribed - encouraged healthy diet and exercise as tolerated - rosuvastatin (CRESTOR) 10 MG tablet; TAKE ONE (1) TABLET BY MOUTH EVERY DAY  Dispense: 90 tablet; Refill: 1 - CMP14+EGFR - Lipid panel  5. Stage 3a chronic kidney disease (HCC) - CMP14+EGFR  6. Prediabetes - encouraged healthy diet and exercise as tolerated - Bayer DCA Hb A1c Waived  7. Vitamin D insufficiency - recheck labs today - patient is not sure what dosage of vitamin D she is taking - VITAMIN D 25 Hydroxy (Vit-D Deficiency, Fractures)  8. Postmenopausal estrogen deficiency - DG [facility] DEXA  9. Chronic right-sided low back pain with right-sided sciatica - poorly controlled on Melocixam and Tylenol alone, will add Tramadol as needed - plan to complete controlled substance agreement and urine drug screen at her next visit if she wishes to continue Tramadol - plan to follow-up with Dr. Saintclair Halsted (neurosurgeon) for further treatment options - traMADol (ULTRAM) 50 MG tablet; Take 1 tablet  (50 mg total) by mouth 2 (two) times daily as needed.  Dispense: 60 tablet; Refill: 2  10. Moderate major depression, single episode ([employer]) - patient declined medication and/or counseling  11. Need for immunization against influenza - Flu Vaccine QUAD High Dose(Fluad) - given in office today   Return in about 6 weeks (around 04/23/2021) for Pain contract.  Lucile Crater, NP Student  I personally was present during the history, physical exam, and medical decision-making activities of this service and have verified that the service and findings are accurately documented in the nurse practitioner student's note.  Hendricks Limes, MSN, APRN, FNP-C [facility]   Subjective:    Patient ID: Priscilla Daniels, female    DOB: 11/10/1940, 80 y.o.   MRN: [id number]  Patient Care Team: Loman Brooklyn, FNP as PCP - General (Family Medicine) Okey Regal, [place] as Referring Physician (Optometry)   Chief Complaint:  Chief Complaint  Patient presents with   Hypertension   Hyperlipidemia    Check up of chronic medical conditions    Back Pain    Since yesterday after doing MRI    HPI: Priscilla Daniels is a 80 y.o. female presenting on 03/12/2021 for Hypertension, Hyperlipidemia (Check up of chronic medical conditions ), and Back Pain (Since yesterday after doing MRI)  Patient is accompanied by her daughter who she is okay with being present.  Hypertension: She is well controlled on lisinopril 62m-HCTZ 12.5 mg. She does not check her BP at home.  Atherosclerosis/Hyperlipidemia: She was started on Rosuvastatin 10 mg at her last visit with her previous PCP.  She is tolerating it well and takes it daily.  CKD: She was previously taking both meloxicam and voltaren for her arthritis and pain, and was recently advised to discontinue voltaren and use tylenol as needed for pain.   Back pain: She has had ongoing back pain for over a year that escalated into inability to walk. She  was referred to a spinal specialist, Dr. Saintclair Halsted, who is still working her up. She states she has worse back pain today since being in an MRI yesterday.   Mobility: PT was discontinued while being worked up for her back pain. She states she uses a walker with a seat to mobilize around her home. She has been using a bedside commode that was ordered at her last visit.   Depression/Anxiety: She has not previously been diagnosed with depression. Her daughter reports that she is anxious over inability to tend to her normal housework lately. She is not interested in taking another medication for this to help with mood.   Depression screen [facility] 2/9 03/12/2021 02/19/2021 12/26/2020  Decreased Interest 3 1 2   Down, Depressed, Hopeless 3 1 1   PHQ - 2 Score 6 2 3   Altered sleeping 3 3 3   Tired, decreased energy 3 3 3   Change in appetite 0 0 0  Feeling bad or failure about yourself  3 1 0  Trouble concentrating 3 3 3   Moving slowly or fidgety/restless 0 0 3  Suicidal thoughts 0 0 0  PHQ-9 Score 18 12 15   Difficult doing work/chores Very difficult Extremely dIfficult Extremely dIfficult  Some recent data might be hidden   GAD 7 : Generalized Anxiety Score 03/12/2021 02/19/2021 12/26/2020  Nervous, Anxious, on Edge 1 1 1   Control/stop worrying 3 3 2   Worry too much - different things 3 3 3   Trouble relaxing 1 3 0  Restless 1 2 0  Easily annoyed or irritable 2 3 0  Afraid - awful might happen 3 2 1   Total GAD 7 Score 14 17 7   Anxiety Difficulty Very difficult Extremely difficult Extremely difficult    Social history:  Relevant past medical, surgical, family and social history reviewed and updated as indicated. Interim medical history since our last visit reviewed.  Allergies and medications reviewed and updated.  DATA REVIEWED: CHART IN EPIC  ROS: Negative unless specifically indicated above in HPI.    Current Outpatient Medications:    aspirin 81 MG EC tablet, Take 81 mg by mouth daily. Swallow  whole., Disp: , Rfl:    baclofen (LIORESAL) 10 MG tablet, Take 10 mg by mouth 3 (three) times daily., Disp: , Rfl:    CRANBERRY PO, Take by mouth., Disp: , Rfl:    dorzolamide (TRUSOPT) 2 % ophthalmic solution, 2 (two) times daily., Disp: , Rfl:    traMADol (ULTRAM) 50 MG tablet, Take 1 tablet (50 mg total) by mouth 2 (two) times daily as needed., Disp: 60 tablet, Rfl: 2   TRAVATAN Z 0.004 % SOLN ophthalmic solution, , Disp: , Rfl:    VITAMIN D PO, Take by mouth., Disp: , Rfl:    lisinopril-hydrochlorothiazide (ZESTORETIC) 20-12.5 MG tablet, TAKE ONE (1) TABLET EACH DAY, Disp: 90 tablet, Rfl: 1   meloxicam (MOBIC) 15 MG tablet, TAKE ONE (1) TABLET BY MOUTH EVERY DAY, Disp: 90 tablet, Rfl: 1   rosuvastatin (CRESTOR) 10 MG tablet, TAKE ONE (1) TABLET BY MOUTH EVERY DAY, Disp: 90 tablet, Rfl: 1   Allergies  Allergen Reactions   Lipitor [Atorvastatin Calcium]  Septra [Bactrim]    Sulfa Antibiotics    Past Medical History:  Diagnosis Date   Abnormal vaginal Pap smear    Candidiasis    cuteneous    Chronic UTI    COPD (chronic obstructive pulmonary disease) ([place]) 2010   Dx by CXR    DM (diabetes mellitus) ([place])    Fatigue    Glaucoma    Hemorrhage in optic nerve sheath of left eye 01/23/2020   Hyperlipidemia    Diet Controlled    NIDDM (non-insulin dependent diabetes mellitus)    Phlebitis    Strep pharyngitis    Vaginal atrophy    Vertigo     Past Surgical History:  Procedure Laterality Date   CERVICAL CONE BIOPSY     GLAUCOMA SURGERY  12/04 & 07/05/03   left eye cataract surgery  01/29/2009   right eye cataract surgery  01/13/09   VARICOSE VEIN SURGERY     both legs     Social History   Socioeconomic History   Marital status: Widowed    Spouse name: Not on file   Number of children: 2   Years of education: 7   Highest education level: 7th grade  Occupational History   Occupation: Retired  Tobacco Use   Smoking status: Never   Smokeless tobacco: Never  Vaping Use    Vaping Use: Never used  Substance and Sexual Activity   Alcohol use: No   Drug use: No   Sexual activity: Not Currently  Other Topics Concern   Not on file  Social History Narrative   Her daughter lives with her; She has 2 dogs she takes care of.   Social Determinants of Health   Financial Resource Strain: Low Risk    Difficulty of Paying Living Expenses: Not hard at all  Food Insecurity: No Food Insecurity   Worried About Charity fundraiser in the Last Year: Never true   [place] in the Last Year: Never true  Transportation Needs: No Transportation Needs   Lack of Transportation (Medical): No   Lack of Transportation (Non-Medical): No  Physical Activity: Inactive   Days of Exercise per Week: 0 days   Minutes of Exercise per Session: 0 min  Stress: No Stress Concern Present   Feeling of Stress : Not at all  Social Connections: Socially Isolated   Frequency of Communication with Friends and Family: More than three times a week   Frequency of Social Gatherings with Friends and Family: Once a week   Attends Religious Services: Never   Marine scientist or Organizations: No   Attends Archivist Meetings: Never   Marital Status: Widowed  Human resources officer Violence: Not At Risk   Fear of Current or Ex-Partner: No   Emotionally Abused: No   Physically Abused: No   Sexually Abused: No        Objective:    BP 128/69   Pulse (!) 59   Temp 97.9 F (36.6 C) (Temporal)   SpO2 99%   Wt Readings from Last 3 Encounters:  02/05/21 142 lb (64.4 kg)  12/04/20 146 lb 6 oz (66.4 kg)  10/02/20 151 lb (68.5 kg)    Physical Exam Vitals reviewed.  Constitutional:      General: She is not in acute distress.    Appearance: Normal appearance. She is normal weight. She is not ill-appearing, toxic-appearing or diaphoretic.  HENT:     Head: Normocephalic and atraumatic.  Right Ear: Tympanic membrane, ear canal and external ear normal.     Left Ear: Tympanic  membrane, ear canal and external ear normal.     Nose: Nose normal. No congestion.     Mouth/Throat:     Mouth: Mucous membranes are moist.     Pharynx: Oropharynx is clear. No oropharyngeal exudate or posterior oropharyngeal erythema.  Eyes:     Extraocular Movements: Extraocular movements intact.     Conjunctiva/sclera: Conjunctivae normal.     Pupils: Pupils are equal, round, and reactive to light.  Cardiovascular:     Rate and Rhythm: Normal rate and regular rhythm.     Pulses: Normal pulses.     Heart sounds: Normal heart sounds.  Pulmonary:     Effort: Pulmonary effort is normal. No respiratory distress.     Breath sounds: Normal breath sounds. No wheezing or rhonchi.  Abdominal:     General: Bowel sounds are normal. There is no distension.     Palpations: Abdomen is soft. There is no mass.  Musculoskeletal:        General: Tenderness (low back) present. Normal range of motion.     Cervical back: Normal range of motion.  Skin:    General: Skin is warm and dry.     Capillary Refill: Capillary refill takes less than 2 seconds.  Neurological:     General: No focal deficit present.     Mental Status: She is alert and oriented to person, place, and time. Mental status is at baseline.     Motor: Weakness present.     Gait: Gait abnormal (riding in [employer]).  Psychiatric:        Mood and Affect: Mood normal.        Behavior: Behavior normal.        Thought Content: Thought content normal.        Judgment: Judgment normal.    Lab Results  Component Value Date   TSH 2.290 09/12/2020   Lab Results  Component Value Date   WBC 6.0 12/27/2020   HGB 11.7 12/27/2020   HCT 35.2 12/27/2020   MCV 86 12/27/2020   PLT 228 12/27/2020   Lab Results  Component Value Date   NA 135 09/12/2020   K 4.9 09/12/2020   CO2 24 09/12/2020   GLUCOSE 103 (H) 09/12/2020   BUN 20 09/12/2020   CREATININE 0.94 09/12/2020   BILITOT 0.3 09/12/2020   ALKPHOS 66 09/12/2020   AST 21 09/12/2020   ALT  24 09/12/2020   PROT 7.0 09/12/2020   ALBUMIN 4.1 09/12/2020   CALCIUM 8.9 09/12/2020   EGFR 62 09/12/2020   Lab Results  Component Value Date   CHOL 223 (H) 09/12/2020   Lab Results  Component Value Date   HDL 89 09/12/2020   Lab Results  Component Value Date   LDLCALC 115 (H) 09/12/2020   Lab Results  Component Value Date   TRIG 109 09/12/2020   Lab Results  Component Value Date   CHOLHDL 2.5 09/12/2020   Lab Results  Component Value Date   HGBA1C 6.0 09/12/2020

## 2021-03-12 NOTE — Telephone Encounter (Signed)
Medication list updated.

## 2021-03-13 ENCOUNTER — Encounter: Payer: Self-pay | Admitting: Family Medicine

## 2021-03-13 LAB — CBC WITH DIFFERENTIAL/PLATELET
Basophils Absolute: 0.1 10*3/uL (ref 0.0–0.2)
Basos: 1 %
EOS (ABSOLUTE): 0.1 10*3/uL (ref 0.0–0.4)
Eos: 2 %
Hematocrit: 32.5 % — ABNORMAL LOW (ref 34.0–46.6)
Hemoglobin: 11 g/dL — ABNORMAL LOW (ref 11.1–15.9)
Immature Grans (Abs): 0 10*3/uL (ref 0.0–0.1)
Immature Granulocytes: 0 %
Lymphocytes Absolute: 1.4 10*3/uL (ref 0.7–3.1)
Lymphs: 26 %
MCH: 28.8 pg (ref 26.6–33.0)
MCHC: 33.8 g/dL (ref 31.5–35.7)
MCV: 85 fL (ref 79–97)
Monocytes Absolute: 0.5 10*3/uL (ref 0.1–0.9)
Monocytes: 9 %
Neutrophils Absolute: 3.3 10*3/uL (ref 1.4–7.0)
Neutrophils: 62 %
Platelets: 247 10*3/uL (ref 150–450)
RBC: 3.82 x10E6/uL (ref 3.77–5.28)
RDW: 12.1 % (ref 11.7–15.4)
WBC: 5.3 10*3/uL (ref 3.4–10.8)

## 2021-03-13 LAB — CMP14+EGFR
ALT: 25 IU/L (ref 0–32)
AST: 19 IU/L (ref 0–40)
Albumin/Globulin Ratio: 1.7 (ref 1.2–2.2)
Albumin: 4.3 g/dL (ref 3.7–4.7)
Alkaline Phosphatase: 66 IU/L (ref 44–121)
BUN/Creatinine Ratio: 19 (ref 12–28)
BUN: 19 mg/dL (ref 8–27)
Bilirubin Total: 0.4 mg/dL (ref 0.0–1.2)
CO2: 24 mmol/L (ref 20–29)
Calcium: 9.9 mg/dL (ref 8.7–10.3)
Chloride: 98 mmol/L (ref 96–106)
Creatinine, Ser: 0.99 mg/dL (ref 0.57–1.00)
Globulin, Total: 2.5 g/dL (ref 1.5–4.5)
Glucose: 106 mg/dL — ABNORMAL HIGH (ref 65–99)
Potassium: 4.2 mmol/L (ref 3.5–5.2)
Sodium: 138 mmol/L (ref 134–144)
Total Protein: 6.8 g/dL (ref 6.0–8.5)
eGFR: 58 mL/min/{1.73_m2} — ABNORMAL LOW (ref >59)

## 2021-03-13 LAB — LIPID PANEL
Chol/HDL Ratio: 2 ratio (ref 0.0–4.4)
Cholesterol, Total: 141 mg/dL (ref 100–199)
HDL: 69 mg/dL (ref >39)
LDL Chol Calc (NIH): 57 mg/dL (ref 0–99)
Triglycerides: 74 mg/dL (ref 0–149)
VLDL Cholesterol Cal: 15 mg/dL (ref 5–40)

## 2021-03-13 LAB — VITAMIN D 25 HYDROXY (VIT D DEFICIENCY, FRACTURES): Vit D, 25-Hydroxy: 36.4 ng/mL (ref 30.0–100.0)

## 2021-03-18 ENCOUNTER — Ambulatory Visit: Payer: Self-pay | Admitting: Nurse Practitioner

## 2021-03-25 DIAGNOSIS — M5416 Radiculopathy, lumbar region: Secondary | ICD-10-CM | POA: Diagnosis not present

## 2021-03-25 DIAGNOSIS — M542 Cervicalgia: Secondary | ICD-10-CM | POA: Diagnosis not present

## 2021-04-02 ENCOUNTER — Ambulatory Visit: Payer: Medicare Other | Admitting: Family Medicine

## 2021-04-02 DIAGNOSIS — I10 Essential (primary) hypertension: Secondary | ICD-10-CM | POA: Diagnosis not present

## 2021-04-02 DIAGNOSIS — M542 Cervicalgia: Secondary | ICD-10-CM | POA: Diagnosis not present

## 2021-04-09 ENCOUNTER — Ambulatory Visit: Payer: Medicare Other | Attending: Student

## 2021-04-09 ENCOUNTER — Other Ambulatory Visit: Payer: Self-pay

## 2021-04-09 DIAGNOSIS — R262 Difficulty in walking, not elsewhere classified: Secondary | ICD-10-CM | POA: Diagnosis not present

## 2021-04-09 DIAGNOSIS — M6281 Muscle weakness (generalized): Secondary | ICD-10-CM | POA: Diagnosis not present

## 2021-04-09 NOTE — Therapy (Signed)
[facility] [address] [place], [place], [address] Phone: [phone]   Fax:  [phone]  Physical Therapy Evaluation  Patient Details  Name: Priscilla Daniels MRN: [id number] Date of Birth: Apr 09, 1941 Referring Provider (PT): Meyran   Encounter Date: 04/09/2021   PT End of Session - 04/09/21 1254     Visit Number 1    Number of Visits 6    Date for PT Re-Evaluation 05/21/21    PT Start Time 1300    PT Stop Time 1342    PT Time Calculation (min) 42 min    Activity Tolerance Patient tolerated treatment well    Behavior During Therapy [facility] for tasks assessed/performed             Past Medical History:  Diagnosis Date   Abnormal vaginal Pap smear    Candidiasis    cuteneous    Chronic UTI    COPD (chronic obstructive pulmonary disease) (HCC) 2010   Dx by CXR    DM (diabetes mellitus) (HCC)    Fatigue    Glaucoma    Hemorrhage in optic nerve sheath of left eye 01/23/2020   Hyperlipidemia    Diet Controlled    NIDDM (non-insulin dependent diabetes mellitus)    Osteopenia 03/12/2021   Phlebitis    Strep pharyngitis    Vaginal atrophy    Vertigo     Past Surgical History:  Procedure Laterality Date   CERVICAL CONE BIOPSY     GLAUCOMA SURGERY  12/04 & 07/05/03   left eye cataract surgery  01/29/2009   right eye cataract surgery  01/13/09   VARICOSE VEIN SURGERY     both legs     There were no vitals filed for this visit.    Subjective Assessment - 04/09/21 1256     Subjective COVID-19 screen performed prior to patient entering clinic. She notes that she had physical therapy for her back leg weakness. She notes that she has been doing her HEP. She notes that she is mainly using a Rollator or a cane to get around her house. However, she has to use a wheelchair when she goes out in public. She notes that therapy helped a little last time as she was able to walk up 4 steps to see her grandson. Her daughter said she is  not getting out due to the "trouble of it." She notes that her vision has been changing since her head and neck has been bothering her. She was referred to see her eye doctor and will see them in December. She notes that this all began getting worse when she fell in February 2021.    Patient is accompained by: Family member   Daughter, [name]   Pertinent History COPD, DM, scoliosis.    Limitations Walking;Other (comment)   bending, reaching   How long can you stand comfortably? requires upper extremity support    How long can you walk comfortably? Around home with a FWW.    Diagnostic tests X-ray.    Patient Stated Goals Walk and get around better, stand up to cook, reach to turn off the stove, walk with quad cane    Currently in Pain? Yes    Pain Score 5     Pain Location Head    Pain Orientation Posterior    Pain Descriptors / Indicators Sore    Pain Type Chronic pain    Pain Onset More than a month ago    Pain Frequency Constant  Aggravating Factors  puzzles    Pain Relieving Factors "hot or cold gel"    Effect of Pain on Daily Activities unable to perform activities such as word searches                [facility] PT Assessment - 04/09/21 0001       Assessment   Medical Diagnosis Lower extremity weakness    Referring Provider (PT) Meyran    Onset Date/Surgical Date --   February 2021   Next MD Visit --   week of 10/24 for EMG of lower extremities   Prior Therapy Yes      Precautions   Precautions Fall    Precaution Comments HIGH FALL RISK.      Balance Screen   Has the patient fallen in the past 6 months Yes    How many times? 1    Has the patient had a decrease in activity level because of a fear of falling?  Yes    Is the patient reluctant to leave their home because of a fear of falling?  Yes      Home Environment   Living Environment Private residence    Living Arrangements Children    Type of Home House    Home Access Stairs to enter    Entrance Stairs-Number  of Steps 1    Home Layout One level      Prior Function   Level of Independence Independent    Vocation Retired      IT consultant   Overall Cognitive Status Within Functional Limits for tasks assessed    Attention Focused    Focused Attention Appears intact    Memory Appears intact    Awareness Appears intact    Problem Solving Appears intact      ROM / Strength   AROM / PROM / Strength Strength      AROM   Overall AROM Comments WFL for tasks assessed      Strength   Strength Assessment Site Hip;Knee;Ankle    Right/Left Hip Right;Left    Right Hip Flexion 4-/5    Left Hip Flexion 3+/5    Right/Left Knee Right;Left    Right Knee Flexion 4/5    Right Knee Extension 4/5    Left Knee Flexion 4-/5    Left Knee Extension 3+/5    Right/Left Ankle Right;Left    Right Ankle Dorsiflexion 3+/5    Left Ankle Dorsiflexion 3+/5      Transfers   Transfers Sit to Stand;Stand to Sit    Sit to Stand 4: Min guard;With upper extremity assist    Stand to Sit 4: Min guard;With upper extremity assist;Uncontrolled descent      Ambulation/Gait   Ambulation/Gait Yes    Ambulation/Gait Assistance 6: Modified independent (Device/Increase time)    Ambulation Distance (Feet) 66 Feet    Assistive device Rolling walker    Gait Pattern Step-through pattern    Ambulation Surface Level;Indoor    Gait velocity significantly decreased    Gait Comments Presented to treatment in a wheelchair                        Objective measurements completed on examination: See above findings.                     PT Long Term Goals - 04/09/21 1404       PT LONG TERM GOAL #1   Title Patient will  be independent with her HEP    Time 6    Period Weeks    Status New    Target Date 05/21/21      PT LONG TERM GOAL #2   Title Patient will be able to walk at least 60 feet with a quad cane for improved independence navigate her houselold environment.    Baseline walker    Time 6     Period Weeks    Status New    Target Date 05/21/21      PT LONG TERM GOAL #3   Title Patient will be able to stand for at least 5 minutes with intermittent upper extremity support for improved function cooking    Baseline unable without guarding    Time 6    Period Weeks    Status New    Target Date 05/21/21                    Plan - 04/09/21 1254     Clinical Impression Statement Patient is a 80 year old female presenting to physical therapy with lower extremity weakness and a high risk of falls. She presented with bilateral lower extremity weakness. Recommend that she continued with her recommended plan of care to address her impairments to return to her prior level of function.    Personal Factors and Comorbidities Age;Other;Comorbidity 1;Comorbidity 2;Comorbidity 3+;Transportation;Time since onset of injury/illness/exacerbation    Comorbidities COPD, DM, scoliosis.    Examination-Activity Limitations Other;Transfers;Locomotion Level;Stairs;Stand    Examination-Participation Restrictions Meal Prep;Other;Community Activity    Stability/Clinical Decision Making Evolving/Moderate complexity    Clinical Decision Making Moderate    Rehab Potential Fair    PT Frequency 1x / week   Recommended 2x per week, but patient declined due to fear of bad weather and transportation   PT Duration 6 weeks    PT Treatment/Interventions Electrical Stimulation;Neuromuscular re-education;Therapeutic exercise;Balance training;Therapeutic activities;Functional mobility training;Stair training;Gait training;Patient/family education;Manual techniques;Energy conservation    PT Next Visit Plan Lower extremity strengthening and balance    PT Home Exercise Plan LAQ (2x10) in addition to HEP from previous episode of physical therapy (seated marching, heel raises)    Consulted and Agree with Plan of Care Patient;Family member/caregiver    Family Member Consulted Daughter [name])              Patient will benefit from skilled therapeutic intervention in order to improve the following deficits and impairments:  Abnormal gait, Difficulty walking, Decreased activity tolerance, Decreased balance, Decreased mobility, Decreased strength, Postural dysfunction  Visit Diagnosis: Difficulty in walking, not elsewhere classified  Muscle weakness (generalized)     Problem List Patient Active Problem List   Diagnosis Date Noted   Prediabetes 03/12/2021   Vitamin D insufficiency 03/12/2021   Arthritis 03/12/2021   Osteopenia 03/12/2021   Posterior vitreous detachment, both eyes 07/30/2020   Central retinal vein occlusion of left eye 01/23/2020   Intermediate stage nonexudative age-related macular degeneration of both eyes 01/23/2020   Primary open angle glaucoma of both eyes, moderate stage 01/23/2020   Retinal hemorrhage of left eye 01/23/2020   Metabolic syndrome 02/21/2016   CKD (chronic kidney disease), stage III (HCC) 02/11/2015   Atherosclerosis of aorta (HCC) 02/08/2015   Glaucoma (increased eye pressure) 09/30/2012   Hyperlipidemia with target LDL less than 100 09/30/2012   Essential hypertension 09/30/2012    Granville Lewis, PT 04/09/2021, 3:40 PM  [facility] [address] [place], [place], [address] Phone: [phone]  Fax:  [phone]  Name: BRITLYN MARTINE MRN: [id number] Date of Birth: [birth year]

## 2021-04-15 DIAGNOSIS — R29898 Other symptoms and signs involving the musculoskeletal system: Secondary | ICD-10-CM | POA: Diagnosis not present

## 2021-04-15 DIAGNOSIS — R200 Anesthesia of skin: Secondary | ICD-10-CM | POA: Diagnosis not present

## 2021-04-16 ENCOUNTER — Ambulatory Visit: Payer: Medicare Other

## 2021-04-16 ENCOUNTER — Other Ambulatory Visit: Payer: Self-pay

## 2021-04-16 DIAGNOSIS — M6281 Muscle weakness (generalized): Secondary | ICD-10-CM

## 2021-04-16 DIAGNOSIS — R262 Difficulty in walking, not elsewhere classified: Secondary | ICD-10-CM

## 2021-04-16 NOTE — Therapy (Signed)
[facility] [address] [place], [place], [address] Phone: [phone]   Fax:  [phone]  Physical Therapy Treatment  Patient Details  Name: Priscilla Daniels MRN: [id number] Date of Birth: Oct 17, 1940 Referring Provider (PT): Meyran   Encounter Date: 04/16/2021   PT End of Session - 04/16/21 1305     Visit Number 2    Number of Visits 6    Date for PT Re-Evaluation 05/21/21    PT Start Time 1301    PT Stop Time 1346    PT Time Calculation (min) 45 min    Activity Tolerance Patient tolerated treatment well    Behavior During Therapy [facility] for tasks assessed/performed             Past Medical History:  Diagnosis Date   Abnormal vaginal Pap smear    Candidiasis    cuteneous    Chronic UTI    COPD (chronic obstructive pulmonary disease) (HCC) 2010   Dx by CXR    DM (diabetes mellitus) (HCC)    Fatigue    Glaucoma    Hemorrhage in optic nerve sheath of left eye 01/23/2020   Hyperlipidemia    Diet Controlled    NIDDM (non-insulin dependent diabetes mellitus)    Osteopenia 03/12/2021   Phlebitis    Strep pharyngitis    Vaginal atrophy    Vertigo     Past Surgical History:  Procedure Laterality Date   CERVICAL CONE BIOPSY     GLAUCOMA SURGERY  12/04 & 07/05/03   left eye cataract surgery  01/29/2009   right eye cataract surgery  01/13/09   VARICOSE VEIN SURGERY     both legs     There were no vitals filed for this visit.   Subjective Assessment - 04/16/21 1303     Subjective COVID-19 screen performed prior to patient entering clinic. She reports that she feels good today, but just a little weak.    Patient is accompained by: Family member   Daughter, [name]   Pertinent History COPD, DM, scoliosis.    Limitations Walking;Other (comment)   bending, reaching   How long can you stand comfortably? requires upper extremity support    How long can you walk comfortably? Around home with a FWW.    Diagnostic tests X-ray.     Patient Stated Goals Walk and get around better, stand up to cook, reach to turn off the stove, walk with quad cane    Currently in Pain? No/denies    Pain Onset More than a month ago                               [facility] Adult PT Treatment/Exercise - 04/16/21 0001       Exercises   Exercises Knee/Hip      Knee/Hip Exercises: Aerobic   Nustep L2 x 11 minutes      Knee/Hip Exercises: Standing   Heel Raises Both   seated; 2 minutes   Hip Abduction Both   BUE support; 2 minutes   Hip Extension Both;Knee straight   BUE support,     Knee/Hip Exercises: Seated   Long Cytogeneticist;Both   2 minutes   Clamshell with TheraBand Red   2 minutes   Sit to Sand 10 reps;1 set;with UE support   with therapist guarding  PT Long Term Goals - 04/09/21 1404       PT LONG TERM GOAL #1   Title Patient will be independent with her HEP    Time 6    Period Weeks    Status New    Target Date 05/21/21      PT LONG TERM GOAL #2   Title Patient will be able to walk at least 60 feet with a quad cane for improved independence navigate her houselold environment.    Baseline walker    Time 6    Period Weeks    Status New    Target Date 05/21/21      PT LONG TERM GOAL #3   Title Patient will be able to stand for at least 5 minutes with intermittent upper extremity support for improved function cooking    Baseline unable without guarding    Time 6    Period Weeks    Status New    Target Date 05/21/21                   Plan - 04/16/21 1306     Clinical Impression Statement Patient presented to her first follow up with good recall of her home exercise program. She required minimal cuing with long arc quads for slow and contnuous motion. She was introduced to multiple new interventions for improved lower extremity strength with moderate difficulty and fatigue. She required guarding with sit to stand transfers  for safety as she exhibited delayed hip extension needed for upright stance. She reported feeling alright upon the conclusion of treatment. She continues to require skilled physical therapy to address her remaining impairments to maximize her functional mobility.    Personal Factors and Comorbidities Age;Other;Comorbidity 1;Comorbidity 2;Comorbidity 3+;Transportation;Time since onset of injury/illness/exacerbation    Comorbidities COPD, DM, scoliosis.    Examination-Activity Limitations Other;Transfers;Locomotion Level;Stairs;Stand    Examination-Participation Restrictions Meal Prep;Other;Community Activity    Stability/Clinical Decision Making Evolving/Moderate complexity    Rehab Potential Fair    PT Frequency 1x / week   Recommended 2x per week, but patient declined due to fear of bad weather and transportation   PT Duration 6 weeks    PT Treatment/Interventions Electrical Stimulation;Neuromuscular re-education;Therapeutic exercise;Balance training;Therapeutic activities;Functional mobility training;Stair training;Gait training;Patient/family education;Manual techniques;Energy conservation    PT Next Visit Plan Lower extremity strengthening and balance    PT Home Exercise Plan LAQ (2x10) in addition to HEP from previous episode of physical therapy (seated marching, heel raises)    Consulted and Agree with Plan of Care Patient;Family member/caregiver    Family Member Consulted Daughter [name])             Patient will benefit from skilled therapeutic intervention in order to improve the following deficits and impairments:  Abnormal gait, Difficulty walking, Decreased activity tolerance, Decreased balance, Decreased mobility, Decreased strength, Postural dysfunction  Visit Diagnosis: Difficulty in walking, not elsewhere classified  Muscle weakness (generalized)     Problem List Patient Active Problem List   Diagnosis Date Noted   Prediabetes 03/12/2021   Vitamin D insufficiency  03/12/2021   Arthritis 03/12/2021   Osteopenia 03/12/2021   Posterior vitreous detachment, both eyes 07/30/2020   Central retinal vein occlusion of left eye 01/23/2020   Intermediate stage nonexudative age-related macular degeneration of both eyes 01/23/2020   Primary open angle glaucoma of both eyes, moderate stage 01/23/2020   Retinal hemorrhage of left eye 01/23/2020   Metabolic syndrome 02/21/2016   CKD (chronic kidney disease), stage III (  HCC) 02/11/2015   Atherosclerosis of aorta (HCC) 02/08/2015   Glaucoma (increased eye pressure) 09/30/2012   Hyperlipidemia with target LDL less than 100 09/30/2012   Essential hypertension 09/30/2012    Granville Lewis, PT 04/16/2021, 3:16 PM  [facility] [address] [place], [place], [address] Phone: [phone]   Fax:  [phone]  Name: Priscilla Daniels MRN: [id number] Date of Birth: 1940/08/08

## 2021-04-22 DIAGNOSIS — M47812 Spondylosis without myelopathy or radiculopathy, cervical region: Secondary | ICD-10-CM | POA: Diagnosis not present

## 2021-04-22 DIAGNOSIS — I10 Essential (primary) hypertension: Secondary | ICD-10-CM | POA: Diagnosis not present

## 2021-04-23 ENCOUNTER — Ambulatory Visit: Payer: Medicare Other | Attending: Student

## 2021-04-23 ENCOUNTER — Other Ambulatory Visit: Payer: Self-pay

## 2021-04-23 DIAGNOSIS — R2681 Unsteadiness on feet: Secondary | ICD-10-CM | POA: Diagnosis not present

## 2021-04-23 DIAGNOSIS — M6281 Muscle weakness (generalized): Secondary | ICD-10-CM | POA: Diagnosis not present

## 2021-04-23 DIAGNOSIS — R262 Difficulty in walking, not elsewhere classified: Secondary | ICD-10-CM | POA: Diagnosis not present

## 2021-04-23 NOTE — Therapy (Signed)
[facility] [address] [place], [place], [address] Phone: [phone]   Fax:  [phone]  Physical Therapy Treatment  Patient Details  Name: Priscilla Daniels MRN: [id number] Date of Birth: 06-16-1941 Referring Provider (PT): Meyran   Encounter Date: 04/23/2021   PT End of Session - 04/23/21 1250     Visit Number 3    Number of Visits 6    Date for PT Re-Evaluation 05/21/21    PT Start Time 1300    PT Stop Time 1355    PT Time Calculation (min) 55 min    Activity Tolerance Patient tolerated treatment well    Behavior During Therapy [facility] for tasks assessed/performed             Past Medical History:  Diagnosis Date   Abnormal vaginal Pap smear    Candidiasis    cuteneous    Chronic UTI    COPD (chronic obstructive pulmonary disease) (HCC) 2010   Dx by CXR    DM (diabetes mellitus) (HCC)    Fatigue    Glaucoma    Hemorrhage in optic nerve sheath of left eye 01/23/2020   Hyperlipidemia    Diet Controlled    NIDDM (non-insulin dependent diabetes mellitus)    Osteopenia 03/12/2021   Phlebitis    Strep pharyngitis    Vaginal atrophy    Vertigo     Past Surgical History:  Procedure Laterality Date   CERVICAL CONE BIOPSY     GLAUCOMA SURGERY  12/04 & 07/05/03   left eye cataract surgery  01/29/2009   right eye cataract surgery  01/13/09   VARICOSE VEIN SURGERY     both legs     There were no vitals filed for this visit.   Subjective Assessment - 04/23/21 1250     Subjective COVID-19 screen performed prior to patient entering clinic.  Pt denied any pain upon arriving for today's treatment session.  Pt stated that she got 6 shots in her neck yesterday and that she feels much better.  Pt ambulated down the hall into the gym without SPC or using wall for balance.    Patient is accompained by: Family member   Daughter, Priscilla Daniels   Pertinent History COPD, DM, scoliosis.    Limitations Walking;Other (comment)   bending,  reaching   How long can you stand comfortably? requires upper extremity support    How long can you walk comfortably? Around home with a FWW.    Diagnostic tests X-ray.    Patient Stated Goals Walk and get around better, stand up to cook, reach to turn off the stove, walk with quad cane    Currently in Pain? No/denies    Pain Onset More than a month ago                               [facility] Adult PT Treatment/Exercise - 04/23/21 0001       Exercises   Exercises Knee/Hip      Knee/Hip Exercises: Aerobic   Nustep Lvl 2 x 15 mins      Knee/Hip Exercises: Standing   Heel Raises Both   2 mins   Hip Abduction Both;Stengthening   BUE support, alternating x 2 mins   Hip Extension Both;Knee straight   BUE support, 2 mins     Knee/Hip Exercises: Seated   Long Arc Quad Strengthening;Both;Weights   2 mins   Long Arc Quad [employer]  3 lbs.    Ball Squeeze x20 with 3 sec hold    Clamshell with TheraBand Red   x20 reps   Sit to Sand 10 reps;with UE support   cues for eccentric control                         PT Long Term Goals - 04/09/21 1404       PT LONG TERM GOAL #1   Title Patient will be independent with her HEP    Time 6    Period Weeks    Status New    Target Date 05/21/21      PT LONG TERM GOAL #2   Title Patient will be able to walk at least 60 feet with a quad cane for improved independence navigate her houselold environment.    Baseline walker    Time 6    Period Weeks    Status New    Target Date 05/21/21      PT LONG TERM GOAL #3   Title Patient will be able to stand for at least 5 minutes with intermittent upper extremity support for improved function cooking    Baseline unable without guarding    Time 6    Period Weeks    Status New    Target Date 05/21/21                   Plan - 04/23/21 1251     Clinical Impression Statement Pt arrives to treatment without any pain today.  Pt states that she received 6 shots in  her neck yesterday and it has made her feel a lot better.  Pt able to tolerate increased warm up time on the Nustep without issue.  Pt ambulates around the facility with [facility] and daughter for balance.  Pt reports feel "good" at completion of treatment, but endorses increased fatigue.    Personal Factors and Comorbidities Age;Other;Comorbidity 1;Comorbidity 2;Comorbidity 3+;Transportation;Time since onset of injury/illness/exacerbation    Comorbidities COPD, DM, scoliosis.    Examination-Activity Limitations Other;Transfers;Locomotion Level;Stairs;Stand    Examination-Participation Restrictions Meal Prep;Other;Community Activity    Stability/Clinical Decision Making Evolving/Moderate complexity    Rehab Potential Fair    PT Frequency 1x / week   Recommended 2x per week, but patient declined due to fear of bad weather and transportation   PT Duration 6 weeks    PT Treatment/Interventions Electrical Stimulation;Neuromuscular re-education;Therapeutic exercise;Balance training;Therapeutic activities;Functional mobility training;Stair training;Gait training;Patient/family education;Manual techniques;Energy conservation    PT Next Visit Plan Lower extremity strengthening and balance    PT Home Exercise Plan LAQ (2x10) in addition to HEP from previous episode of physical therapy (seated marching, heel raises)    Consulted and Agree with Plan of Care Patient;Family member/caregiver    Family Member Consulted Daughter Priscilla Daniels)             Patient will benefit from skilled therapeutic intervention in order to improve the following deficits and impairments:  Abnormal gait, Difficulty walking, Decreased activity tolerance, Decreased balance, Decreased mobility, Decreased strength, Postural dysfunction  Visit Diagnosis: Difficulty in walking, not elsewhere classified  Unsteadiness on feet  Muscle weakness (generalized)     Problem List Patient Active Problem List   Diagnosis Date Noted    Prediabetes 03/12/2021   Vitamin D insufficiency 03/12/2021   Arthritis 03/12/2021   Osteopenia 03/12/2021   Posterior vitreous detachment, both eyes 07/30/2020   Central retinal vein occlusion of left eye 01/23/2020  Intermediate stage nonexudative age-related macular degeneration of both eyes 01/23/2020   Primary open angle glaucoma of both eyes, moderate stage 01/23/2020   Retinal hemorrhage of left eye [id number]   Metabolic syndrome 99991111   CKD (chronic kidney disease), stage III ([place]) 02/11/2015   Atherosclerosis of aorta ([place]) 02/08/2015   Glaucoma (increased eye pressure) 09/30/2012   Hyperlipidemia with target LDL less than 100 09/30/2012   Essential hypertension 09/30/2012    Kathrynn Ducking, PTA 04/23/2021, 2:26 PM  [facility] [address] [place], [place], [address] Phone: [phone]   Fax:  [phone]  Name: ALYRIC LONES MRN: [id number] Date of Birth: 1941-03-12

## 2021-04-30 ENCOUNTER — Encounter: Payer: Self-pay | Admitting: Physical Therapy

## 2021-04-30 ENCOUNTER — Other Ambulatory Visit: Payer: Self-pay

## 2021-04-30 ENCOUNTER — Ambulatory Visit: Payer: Medicare Other | Admitting: Physical Therapy

## 2021-04-30 DIAGNOSIS — M6281 Muscle weakness (generalized): Secondary | ICD-10-CM

## 2021-04-30 DIAGNOSIS — R2681 Unsteadiness on feet: Secondary | ICD-10-CM

## 2021-04-30 DIAGNOSIS — R262 Difficulty in walking, not elsewhere classified: Secondary | ICD-10-CM

## 2021-04-30 NOTE — Therapy (Signed)
[facility] [place], [place], [address] Phone: [phone]   Fax:  [phone]  Physical Therapy Treatment  Patient Details  Name: Priscilla Daniels MRN: [id number] Date of Birth: [birth year] Referring Provider (PT): Meyran   Encounter Date: 04/30/2021   PT End of Session - 04/30/21 1313     Visit Number 4    Number of Visits 6    Date for PT Re-Evaluation 05/21/21    PT Start Time [id number]    PT Stop Time 1343    PT Time Calculation (min) 38 min    Equipment Utilized During Treatment Other (comment)   FWW   Activity Tolerance Patient tolerated treatment well    Behavior During Therapy [facility] for tasks assessed/performed             Past Medical History:  Diagnosis Date   Abnormal vaginal Pap smear    Candidiasis    cuteneous    Chronic UTI    COPD (chronic obstructive pulmonary disease) ([place]) 2010   Dx by CXR    DM (diabetes mellitus) ([place])    Fatigue    Glaucoma    Hemorrhage in optic nerve sheath of left eye 01/23/2020   Hyperlipidemia    Diet Controlled    NIDDM (non-insulin dependent diabetes mellitus)    Osteopenia 03/12/2021   Phlebitis    Strep pharyngitis    Vaginal atrophy    Vertigo     Past Surgical History:  Procedure Laterality Date   CERVICAL CONE BIOPSY     GLAUCOMA SURGERY  12/04 & 07/05/03   left eye cataract surgery  01/29/2009   right eye cataract surgery  01/13/09   VARICOSE VEIN SURGERY     both legs     There were no vitals filed for this visit.   Subjective Assessment - 04/30/21 1300     Subjective COVID-19 screen performed prior to patient entering clinic. Patient states that some days she has good days and bad days.Today is not a good day per patient report. Her daughter reports that sometimes the patient has difficulty in lifting her legs especially while walking. Prior to the fall in which she hit her head, the patient was driving, cleaning, cooking and now she isn't doing any  of that.    Patient is accompained by: Family member   daughter, Priscilla Daniels   Pertinent History COPD, DM, scoliosis.    Limitations Walking;Other (comment)    How long can you stand comfortably? requires upper extremity support    How long can you walk comfortably? Around home with a FWW.    Diagnostic tests X-ray.    Patient Stated Goals Walk and get around better, stand up to cook, reach to turn off the stove, walk with quad cane    Currently in Pain? No/denies                [facility] PT Assessment - 04/30/21 0001       Assessment   Medical Diagnosis Lower extremity weakness    Referring Provider (PT) Meyran    Next MD Visit 05/21/2021   to PCP   Prior Therapy Yes      Precautions   Precautions Fall    Precaution Comments HIGH FALL RISK.                           [place] Adult PT Treatment/Exercise - 04/30/21 0001       Knee/Hip  Exercises: Aerobic   Nustep Lvl 1x 17 mins      Knee/Hip Exercises: Seated   Long Arc Quad AROM;Both;2 sets;10 reps    Clamshell with TheraBand Yellow   x20 reps   Marching Strengthening;Both;2 sets;10 reps;Limitations    Marching Limitations yellow theraband    Hamstring Curl Strengthening;Both;2 sets;10 reps;Limitations    Hamstring Limitations yellow theraband                          PT Long Term Goals - 04/09/21 1404       PT LONG TERM GOAL #1   Title Patient will be independent with her HEP    Time 6    Period Weeks    Status New    Target Date 05/21/21      PT LONG TERM GOAL #2   Title Patient will be able to walk at least 60 feet with a quad cane for improved independence navigate her houselold environment.    Baseline walker    Time 6    Period Weeks    Status New    Target Date 05/21/21      PT LONG TERM GOAL #3   Title Patient will be able to stand for at least 5 minutes with intermittent upper extremity support for improved function cooking    Baseline unable without guarding    Time 6     Period Weeks    Status New    Target Date 05/21/21                   Plan - 04/30/21 1353     Clinical Impression Statement Patient and daughter presented in clinic with reports of a "bad day." Patient's history of limited function starts after a fall in which she hit her head.Marland Kitchen PMH includes DDD throughout her spine as recollected by her daughter although she denies her mother being diagnosed with spinal stenosis. Patient and daughter both say that at times she has to work a lot harder in order to advance LEs especially while in standing. Patient able to complete seated marching fairly well although when in standing she states that sometimes is very difficult and limited. Patient was initally instructed to ambulate to a plinth table in the clinic gym but unable secondary to instability. Patient fatigues with therex as well. Patient observed ambulating with FWW with small step lengths within walker and at times required min assist to stabilize walker as patient fatigued.    Personal Factors and Comorbidities Age;Other;Comorbidity 1;Comorbidity 2;Comorbidity 3+;Transportation;Time since onset of injury/illness/exacerbation    Comorbidities COPD, DM, scoliosis.    Examination-Activity Limitations Other;Transfers;Locomotion Level;Stairs;Stand    Examination-Participation Restrictions Meal Prep;Other;Community Activity    Stability/Clinical Decision Making Evolving/Moderate complexity    Rehab Potential Fair    PT Frequency 1x / week    PT Duration 6 weeks    PT Treatment/Interventions Electrical Stimulation;Neuromuscular re-education;Therapeutic exercise;Balance training;Therapeutic activities;Functional mobility training;Stair training;Gait training;Patient/family education;Manual techniques;Energy conservation    PT Next Visit Plan Lower extremity strengthening and balance    PT Home Exercise Plan LAQ (2x10) in addition to HEP from previous episode of physical therapy (seated marching,  heel raises)    Consulted and Agree with Plan of Care Patient;Family member/caregiver    Family Member Consulted Daughter Priscilla Daniels)             Patient will benefit from skilled therapeutic intervention in order to improve the following deficits and impairments:  Abnormal  gait, Difficulty walking, Decreased activity tolerance, Decreased balance, Decreased mobility, Decreased strength, Postural dysfunction  Visit Diagnosis: Difficulty in walking, not elsewhere classified  Unsteadiness on feet  Muscle weakness (generalized)     Problem List Patient Active Problem List   Diagnosis Date Noted   Prediabetes 03/12/2021   Vitamin D insufficiency 03/12/2021   Arthritis 03/12/2021   Osteopenia 03/12/2021   Posterior vitreous detachment, both eyes 07/30/2020   Central retinal vein occlusion of left eye 01/23/2020   Intermediate stage nonexudative age-related macular degeneration of both eyes 01/23/2020   Primary open angle glaucoma of both eyes, moderate stage 01/23/2020   Retinal hemorrhage of left eye [id number]   Metabolic syndrome 99991111   CKD (chronic kidney disease), stage III ([place]) 02/11/2015   Atherosclerosis of aorta ([place]) 02/08/2015   Glaucoma (increased eye pressure) 09/30/2012   Hyperlipidemia with target LDL less than 100 09/30/2012   Essential hypertension 09/30/2012    Standley Brooking, PTA 04/30/2021, 2:06 PM  [facility] [place], [place], [address] Phone: [phone]   Fax:  [phone]  Name: Priscilla Daniels MRN: [id number] Date of Birth: May 09, 1941

## 2021-05-07 ENCOUNTER — Encounter: Payer: Self-pay | Admitting: Physical Therapy

## 2021-05-07 ENCOUNTER — Ambulatory Visit: Payer: Medicare Other | Admitting: Physical Therapy

## 2021-05-07 ENCOUNTER — Other Ambulatory Visit: Payer: Self-pay

## 2021-05-07 DIAGNOSIS — R2681 Unsteadiness on feet: Secondary | ICD-10-CM

## 2021-05-07 DIAGNOSIS — M6281 Muscle weakness (generalized): Secondary | ICD-10-CM

## 2021-05-07 DIAGNOSIS — R262 Difficulty in walking, not elsewhere classified: Secondary | ICD-10-CM | POA: Diagnosis not present

## 2021-05-07 NOTE — Therapy (Signed)
[facility] [place], [place], [address] Phone: [phone]   Fax:  [phone]  Physical Therapy Treatment  Patient Details  Name: Priscilla Daniels MRN: [id number] Date of Birth: 1941/04/05 Referring Provider (PT): Meyran   Encounter Date: 05/07/2021   PT End of Session - 05/07/21 [id number]     Visit Number 5    Number of Visits 6    Date for PT Re-Evaluation 05/21/21    PT Start Time 1304    PT Stop Time [id number]    PT Time Calculation (min) 33 min    Equipment Utilized During Treatment Other (comment)   WC   Activity Tolerance Patient tolerated treatment well    Behavior During Therapy [facility] for tasks assessed/performed             Past Medical History:  Diagnosis Date   Abnormal vaginal Pap smear    Candidiasis    cuteneous    Chronic UTI    COPD (chronic obstructive pulmonary disease) ([place]) 2010   Dx by CXR    DM (diabetes mellitus) ([place])    Fatigue    Glaucoma    Hemorrhage in optic nerve sheath of left eye 01/23/2020   Hyperlipidemia    Diet Controlled    NIDDM (non-insulin dependent diabetes mellitus)    Osteopenia 03/12/2021   Phlebitis    Strep pharyngitis    Vaginal atrophy    Vertigo     Past Surgical History:  Procedure Laterality Date   CERVICAL CONE BIOPSY     GLAUCOMA SURGERY  12/04 & 07/05/03   left eye cataract surgery  01/29/2009   right eye cataract surgery  01/13/09   VARICOSE VEIN SURGERY     both legs     There were no vitals filed for this visit.   Subjective Assessment - 05/07/21 1304     Subjective COVID-19 screen performed prior to patient entering clinic. Has had two instances this week where she stood up for too long and needed her daughter to assist her to the couch to sit. Daughter was inquiring about potentially a hardship from work as she is fearful to leave her mother for very long. Reports that she is exceptionally easy to fatigue and her LEs won't hold her up.    Patient  is accompained by: Family member    Pertinent History COPD, DM, scoliosis.    Limitations Walking;Other (comment)    How long can you stand comfortably? requires upper extremity support    How long can you walk comfortably? Around home with a FWW.    Diagnostic tests X-ray.    Patient Stated Goals Walk and get around better, stand up to cook, reach to turn off the stove, walk with quad cane    Currently in Pain? No/denies                [facility] PT Assessment - 05/07/21 0001       Assessment   Medical Diagnosis Lower extremity weakness    Referring Provider (PT) Meyran    Next MD Visit 05/21/2021    Prior Therapy Yes      Precautions   Precautions Fall    Precaution Comments HIGH FALL RISK.                           [facility] Adult PT Treatment/Exercise - 05/07/21 0001       Knee/Hip Exercises: Aerobic   Nustep L2  x15 min      Knee/Hip Exercises: Seated   Long Arc Quad AROM;Both;15 reps    Clamshell with TheraBand Yellow   x20 reps   Other Seated Knee/Hip Exercises B heel/toe raise x20 reps    Marching Strengthening;Both;Limitations;15 reps    Marching Limitations yellow theraband    Hamstring Curl Strengthening;Both;Limitations;15 reps    Hamstring Limitations yellow theraband                          PT Long Term Goals - 04/09/21 1404       PT LONG TERM GOAL #1   Title Patient will be independent with her HEP    Time 6    Period Weeks    Status New    Target Date 05/21/21      PT LONG TERM GOAL #2   Title Patient will be able to walk at least 60 feet with a quad cane for improved independence navigate her houselold environment.    Baseline walker    Time 6    Period Weeks    Status New    Target Date 05/21/21      PT LONG TERM GOAL #3   Title Patient will be able to stand for at least 5 minutes with intermittent upper extremity support for improved function cooking    Baseline unable without guarding    Time 6    Period  Weeks    Status New    Target Date 05/21/21                   Plan - 05/07/21 1351     Clinical Impression Statement Patient presented in clinic with reports of more weakness and fatigue. Had to use WC to get into clinic today due to weakness. Patient encouraged to continue HEP daily with several different sets to maintain functional strength. Patient requires supervision for transfers from [facility]. Able to complete all exercises with slow, purposeful speed and reported fairly fatigued by end of session.    Personal Factors and Comorbidities Age;Other;Comorbidity 1;Comorbidity 2;Comorbidity 3+;Transportation;Time since onset of injury/illness/exacerbation    Comorbidities COPD, DM, scoliosis.    Examination-Activity Limitations Other;Transfers;Locomotion Level;Stairs;Stand    Examination-Participation Restrictions Meal Prep;Other;Community Activity    Stability/Clinical Decision Making Evolving/Moderate complexity    Rehab Potential Fair    PT Frequency 1x / week    PT Duration 6 weeks    PT Treatment/Interventions Electrical Stimulation;Neuromuscular re-education;Therapeutic exercise;Balance training;Therapeutic activities;Functional mobility training;Stair training;Gait training;Patient/family education;Manual techniques;Energy conservation    PT Next Visit Plan Lower extremity strengthening and balance    PT Home Exercise Plan LAQ (2x10) in addition to HEP from previous episode of physical therapy (seated marching, heel raises)    Consulted and Agree with Plan of Care Patient;Family member/caregiver    Family Member Consulted Daughter Priscilla Daniels)             Patient will benefit from skilled therapeutic intervention in order to improve the following deficits and impairments:  Abnormal gait, Difficulty walking, Decreased activity tolerance, Decreased balance, Decreased mobility, Decreased strength, Postural dysfunction  Visit Diagnosis: Difficulty in walking, not elsewhere  classified  Unsteadiness on feet  Muscle weakness (generalized)     Problem List Patient Active Problem List   Diagnosis Date Noted   Prediabetes 03/12/2021   Vitamin D insufficiency 03/12/2021   Arthritis 03/12/2021   Osteopenia 03/12/2021   Posterior vitreous detachment, both eyes 07/30/2020   Central retinal vein occlusion  of left eye 01/23/2020   Intermediate stage nonexudative age-related macular degeneration of both eyes 01/23/2020   Primary open angle glaucoma of both eyes, moderate stage 01/23/2020   Retinal hemorrhage of left eye 01/23/2020   Metabolic syndrome 02/21/2016   CKD (chronic kidney disease), stage III (HCC) 02/11/2015   Atherosclerosis of aorta (HCC) 02/08/2015   Glaucoma (increased eye pressure) 09/30/2012   Hyperlipidemia with target LDL less than 100 09/30/2012   Essential hypertension 09/30/2012    Marvell Fuller, PTA 05/07/2021, 2:35 PM  [facility] [address] [place], [place], [address] Phone: [phone]   Fax:  [phone]  Name: Priscilla Daniels MRN: [id number] Date of Birth: [birth year]

## 2021-05-21 ENCOUNTER — Encounter: Payer: Self-pay | Admitting: Family Medicine

## 2021-05-21 ENCOUNTER — Encounter: Payer: Self-pay | Admitting: Physical Therapy

## 2021-05-21 ENCOUNTER — Ambulatory Visit (INDEPENDENT_AMBULATORY_CARE_PROVIDER_SITE_OTHER): Payer: Medicare Other | Admitting: Family Medicine

## 2021-05-21 ENCOUNTER — Ambulatory Visit: Payer: Medicare Other | Admitting: Physical Therapy

## 2021-05-21 ENCOUNTER — Other Ambulatory Visit: Payer: Self-pay

## 2021-05-21 VITALS — BP 115/69 | HR 66 | Temp 97.6°F

## 2021-05-21 DIAGNOSIS — N3001 Acute cystitis with hematuria: Secondary | ICD-10-CM

## 2021-05-21 DIAGNOSIS — M5441 Lumbago with sciatica, right side: Secondary | ICD-10-CM

## 2021-05-21 DIAGNOSIS — N3941 Urge incontinence: Secondary | ICD-10-CM

## 2021-05-21 DIAGNOSIS — M1990 Unspecified osteoarthritis, unspecified site: Secondary | ICD-10-CM

## 2021-05-21 DIAGNOSIS — G8929 Other chronic pain: Secondary | ICD-10-CM | POA: Diagnosis not present

## 2021-05-21 DIAGNOSIS — Z79899 Other long term (current) drug therapy: Secondary | ICD-10-CM | POA: Diagnosis not present

## 2021-05-21 DIAGNOSIS — R3989 Other symptoms and signs involving the genitourinary system: Secondary | ICD-10-CM

## 2021-05-21 DIAGNOSIS — R262 Difficulty in walking, not elsewhere classified: Secondary | ICD-10-CM | POA: Diagnosis not present

## 2021-05-21 DIAGNOSIS — R350 Frequency of micturition: Secondary | ICD-10-CM | POA: Diagnosis not present

## 2021-05-21 DIAGNOSIS — I83813 Varicose veins of bilateral lower extremities with pain: Secondary | ICD-10-CM | POA: Diagnosis not present

## 2021-05-21 DIAGNOSIS — R2681 Unsteadiness on feet: Secondary | ICD-10-CM

## 2021-05-21 DIAGNOSIS — M4802 Spinal stenosis, cervical region: Secondary | ICD-10-CM | POA: Diagnosis not present

## 2021-05-21 DIAGNOSIS — M6281 Muscle weakness (generalized): Secondary | ICD-10-CM

## 2021-05-21 MED ORDER — TRAMADOL HCL 50 MG PO TABS: 50.0000 mg | ORAL_TABLET | Freq: Two times a day (BID) | ORAL | 2 refills | Status: DC | PRN

## 2021-05-21 MED ORDER — MIRABEGRON ER 25 MG PO TB24: 25.0000 mg | ORAL_TABLET | Freq: Every day | ORAL | 2 refills | Status: DC

## 2021-05-21 NOTE — Therapy (Addendum)
[facility] [place], [place], [address] Phone: [phone]   Fax:  [phone]  Physical Therapy Treatment  Patient Details  Name: Priscilla Daniels MRN: [id number] Date of Birth: August 21, 1940 Referring Provider (PT): Meyran   Encounter Date: 05/21/2021   PT End of Session - 05/21/21 1308     Visit Number 6    Number of Visits 6    Date for PT Re-Evaluation 05/21/21    PT Start Time 1301    PT Stop Time 4801    PT Time Calculation (min) 33 min    Equipment Utilized During Treatment Other (comment)   LBQC   Activity Tolerance Patient limited by fatigue    Behavior During Therapy [facility] for tasks assessed/performed             Past Medical History:  Diagnosis Date   Abnormal vaginal Pap smear    Candidiasis    cuteneous    Chronic UTI    COPD (chronic obstructive pulmonary disease) ([place]) 2010   Dx by CXR    DM (diabetes mellitus) ([place])    Fatigue    Glaucoma    Hemorrhage in optic nerve sheath of left eye 01/23/2020   Hyperlipidemia    Diet Controlled    NIDDM (non-insulin dependent diabetes mellitus)    Osteopenia 03/12/2021   Phlebitis    Strep pharyngitis    Vaginal atrophy    Vertigo     Past Surgical History:  Procedure Laterality Date   CERVICAL CONE BIOPSY     GLAUCOMA SURGERY  12/04 & 07/05/03   left eye cataract surgery  01/29/2009   right eye cataract surgery  01/13/09   VARICOSE VEIN SURGERY     both legs     There were no vitals filed for this visit.   Subjective Assessment - 05/21/21 1304     Subjective COVID-19 screen performed prior to patient entering clinic. Reports that she states that she does good in the mornings but by 4-5 pm her legs feel like jello. States that she has started spacing out exercises daily.    Patient is accompained by: Family member   Daughter, [name]   Pertinent History COPD, DM, scoliosis.    Limitations Walking;Other (comment)    How long can you stand  comfortably? requires upper extremity support    How long can you walk comfortably? Around home with a FWW.    Diagnostic tests X-ray.    Patient Stated Goals Walk and get around better, stand up to cook, reach to turn off the stove, walk with quad cane    Currently in Pain? No/denies                [facility] PT Assessment - 05/21/21 0001       Assessment   Medical Diagnosis Lower extremity weakness    Referring Provider (PT) Meyran    Next MD Visit 05/21/2021    Prior Therapy Yes      Precautions   Precautions Fall    Precaution Comments HIGH FALL RISK.                           [facility] Adult PT Treatment/Exercise - 05/21/21 0001       Knee/Hip Exercises: Aerobic   Nustep L1 x16 min      Knee/Hip Exercises: Seated   Long Arc Quad Strengthening;Both;20 reps;Weights    Long Arc Quad Weight 2 lbs.  Clamshell with TheraBand Yellow   x20 rep   Knee/Hip Flexion s    Marching Strengthening;Both;Limitations;15 reps    Marching Limitations yellow theraband    Hamstring Curl Strengthening;Both;Limitations;15 reps    Hamstring Limitations yellow theraband                          PT Long Term Goals - 05/21/21 1309       PT LONG TERM GOAL #1   Title Patient will be independent with her HEP    Time 6    Period Weeks    Status Achieved      PT LONG TERM GOAL #2   Title Patient will be able to walk at least 60 feet with a quad cane for improved independence navigate her houselold environment.    Baseline walker    Time 6    Period Weeks    Status Partially Met   Walks predominatley with a rollator     PT LONG TERM GOAL #3   Title Patient will be able to stand for at least 5 minutes with intermittent upper extremity support for improved function cooking    Baseline unable without guarding    Time 6    Period Weeks    Status Achieved   No more than 5 minutes                  Plan - 05/21/21 1357     Clinical Impression  Statement Patient presented in clinic with reports of having a moderately good day. Patient walking holding to her daughter with [facility] as well. Patient able to walk small distances without AD but primarily uses rollator. Patient compliant with HEP but splits it up throughout the day to avoid excessive fatigue. Patient continues to fatigue with therex as well as her ADLs. Patient and her daughter encouraged to continue movement and HEP completion. Patient reported fatigue by end of treatment and was able to ambulate slowly to the waiting room but had her daughter use the [facility] to get her to the car.    Personal Factors and Comorbidities Age;Other;Comorbidity 1;Comorbidity 2;Comorbidity 3+;Transportation;Time since onset of injury/illness/exacerbation    Comorbidities COPD, DM, scoliosis.    Examination-Activity Limitations Other;Transfers;Locomotion Level;Stairs;Stand    Examination-Participation Restrictions Meal Prep;Other;Community Activity    Stability/Clinical Decision Making Evolving/Moderate complexity    Rehab Potential Fair    PT Frequency 1x / week    PT Duration 6 weeks    PT Treatment/Interventions Electrical Stimulation;Neuromuscular re-education;Therapeutic exercise;Balance training;Therapeutic activities;Functional mobility training;Stair training;Gait training;Patient/family education;Manual techniques;Energy conservation    PT Next Visit Plan Lower extremity strengthening and balance    PT Home Exercise Plan LAQ (2x10) in addition to HEP from previous episode of physical therapy (seated marching, heel raises)    Consulted and Agree with Plan of Care Patient;Family member/caregiver    Family Member Consulted Daughter [name])             Patient will benefit from skilled therapeutic intervention in order to improve the following deficits and impairments:  Abnormal gait, Difficulty walking, Decreased activity tolerance, Decreased balance, Decreased mobility, Decreased strength, Postural  dysfunction  Visit Diagnosis: Difficulty in walking, not elsewhere classified  Unsteadiness on feet  Muscle weakness (generalized)     Problem List Patient Active Problem List   Diagnosis Date Noted   Prediabetes 03/12/2021   Vitamin D insufficiency 03/12/2021   Arthritis 03/12/2021   Osteopenia 03/12/2021   Posterior vitreous detachment,  both eyes 07/30/2020   Central retinal vein occlusion of left eye 01/23/2020   Intermediate stage nonexudative age-related macular degeneration of both eyes 01/23/2020   Primary open angle glaucoma of both eyes, moderate stage 01/23/2020   Retinal hemorrhage of left eye 85/63/1497   Metabolic syndrome 02/63/7858   CKD (chronic kidney disease), stage III ([place]) 02/11/2015   Atherosclerosis of aorta ([place]) 02/08/2015   Glaucoma (increased eye pressure) 09/30/2012   Hyperlipidemia with target LDL less than 100 09/30/2012   Essential hypertension 09/30/2012    Standley Brooking, PTA 05/21/2021, 2:02 PM  [facility] [place], [place], [address] Phone: [phone]   Fax:  [phone]  Name: Priscilla Daniels MRN: [id number] Date of Birth: 1940/12/14  PHYSICAL THERAPY DISCHARGE SUMMARY  Visits from Start of Care: 6  Current functional level related to goals / functional outcomes: Patient was able to meet most of her goals for physical therapy as she is able to stand to complete her daily activities.    Remaining deficits: Fatigue continues to be her primary deficit as she utilizes her rolling walker primarily for improved mobility.    Education / Equipment: HEP   Patient agrees to discharge. Patient goals were partially met. Patient is being discharged due to meeting the stated rehab goals.   Jacqulynn Cadet, PT, DPT

## 2021-05-21 NOTE — Progress Notes (Signed)
Assessment & Plan:  1-4. Chronic right-sided low back pain with right-sided sciatica/Neural foraminal stenosis of cervical spine/Arthritis/Controlled substance agreement signed Well controlled on current regimen. Controlled substance agreement signed today. Urine drug screen collected today. PDMP reviewed with no concerning findings. - Patient will continue following with neurosurgery for additional treatment options. - traMADol (ULTRAM) 50 MG tablet; Take 1 tablet (50 mg total) by mouth 2 (two) times daily as needed.  Dispense: 60 tablet; Refill: 2 - Compliance Drug Analysis, Ur  5. Varicose veins of both lower extremities with pain - Ambulatory referral to Vascular Surgery  6. Urge incontinence of urine Uncontrolled. Started Myrbetriq. Discussed option of pelvic health physical therapy; she declines for now. - mirabegron ER (MYRBETRIQ) 25 MG TB24 tablet; Take 1 tablet (25 mg total) by mouth daily.  Dispense: 30 tablet; Refill: 2  7. Urinary frequency - Urinalysis, Routine w reflex microscopic - Urine dipstick shows positive for RBC's, positive for protein, positive for leukocytes, nitrates, and positive for ketones.  Micro exam: 11-30 WBC's per HPF and many bacteria. - Microscopic Examination  8. Acute cystitis with hematuria - Urine Culture - cefdinir (OMNICEF) 300 MG capsule; Take 1 capsule (300 mg total) by mouth 2 (two) times daily for 7 days.  Dispense: 14 capsule; Refill: 0   Return in about 3 months (around 08/19/2021) for follow-up of chronic medication conditions.  Hendricks Limes, MSN, APRN, FNP-C [facility]  Subjective:    Patient ID: Priscilla Daniels, female    DOB: 11/10/1940, 80 y.o.   MRN: [id number]  Patient Care Team: Loman Brooklyn, FNP as PCP - General (Family Medicine) Okey Regal, Tucker as Referring Physician (Optometry)   Chief Complaint:  Chief Complaint  Patient presents with   Pain Management    6 week follow up    Urinary  Frequency    X 2 weeks    HPI: Priscilla Daniels is a 80 y.o. female presenting on 05/21/2021 for Pain Management (6 week follow up ) and Urinary Frequency (X 2 weeks)  Pain assessment: Cause of pain- C2-C3 severe bilateral neural foraminal narrowing; mild neural foraminal narrowing of C4-C5, C5-C6, and C6-C7; L3-L4 moderate to severe subarticular recess stenosis with potential impingement of the L4 nerve roots with mild central canal and right foraminal stenosis; and L4-L5 moderate subarticular recess stenosis with potential impingement of the L5 nerve roots with mild foraminal stenosis.  Most recent MRIs completed on 02/21/2021. Pain location- neck, back, right side, and head Pain on scale of 1-10- 6-7/10 without medication; 0/10 with medication Frequency- every other day What increases pain- movement What makes pain better- rest and medication Effects on ADL -very limited due to pain.  When all of this started patient was not walking at all.  She has made some progress with physical therapy, cervical spine injections, and pain medication.  She was able to walk into the building today.  She reports when she is unable to continue walking it is because her legs feel too heavy to continue.  She does have compression hose at home that she wears at times and upon questioning does feel like she is able to walk longer when she wears them. Any change in general medical condition- no.  Patient does report she had nerve conduction studies completed with her neurosurgeon, which were normal.  Current opioids rx- tramadol 50 mg twice daily as needed # meds rx- 60 Effectiveness of current meds- working well Adverse reactions from pain meds- none Morphine  equivalent- 10 MME  Pill count performed-No Last drug screen - will obtain today ( high risk q5m moderate risk q680mlow risk yearly ) Urine drug screen today- Yes Was the NCDaleeviewed- Yes  If yes were their any concerning findings? - No  Overdose  risk: 180  Opioid Risk  05/21/2021  Alcohol 0  Illegal Drugs 0  Rx Drugs 0  Alcohol 0  Illegal Drugs 0  Rx Drugs 0  Age between 16-45 years  0  History of Preadolescent Sexual Abuse 0  Psychological Disease 0  Depression 0  Opioid Risk Tool Scoring 0  Opioid Risk Interpretation Low Risk   Pain contract signed on: 05/21/2021  New complaints: Patient complains of frequency and urgency. She has had symptoms for 2 weeks. Patient denies fever. Patient does have a history of recurrent UTI.  Patient does not have a history of pyelonephritis.    Social history:  Relevant past medical, surgical, family and social history reviewed and updated as indicated. Interim medical history since our last visit reviewed.  Allergies and medications reviewed and updated.  DATA REVIEWED: CHART IN EPIC  ROS: Negative unless specifically indicated above in HPI.    Current Outpatient Medications:    aspirin 81 MG EC tablet, Take 81 mg by mouth daily. Swallow whole., Disp: , Rfl:    baclofen (LIORESAL) 10 MG tablet, Take 10 mg by mouth 3 (three) times daily., Disp: , Rfl:    CRANBERRY PO, Take by mouth., Disp: , Rfl:    dorzolamide (TRUSOPT) 2 % ophthalmic solution, 2 (two) times daily., Disp: , Rfl:    lisinopril-hydrochlorothiazide (ZESTORETIC) 20-12.5 MG tablet, TAKE ONE (1) TABLET EACH DAY, Disp: 90 tablet, Rfl: 1   meloxicam (MOBIC) 15 MG tablet, TAKE ONE (1) TABLET BY MOUTH EVERY DAY, Disp: 90 tablet, Rfl: 1   rosuvastatin (CRESTOR) 10 MG tablet, TAKE ONE (1) TABLET BY MOUTH EVERY DAY, Disp: 90 tablet, Rfl: 1   traMADol (ULTRAM) 50 MG tablet, Take 1 tablet (50 mg total) by mouth 2 (two) times daily as needed., Disp: 60 tablet, Rfl: 2   TRAVATAN Z 0.004 % SOLN ophthalmic solution, , Disp: , Rfl:    VITAMIN D PO, Take 2,000 Units by mouth., Disp: , Rfl:    Allergies  Allergen Reactions   Lipitor [Atorvastatin Calcium]    Septra [Bactrim]    Sulfa Antibiotics    Past Medical History:   Diagnosis Date   Abnormal vaginal Pap smear    Candidiasis    cuteneous    Chronic UTI    [place]   Dx by CXR    DM [place]diabetes mellitus) [place]   Fatigue    Glaucoma    Hemorrhage in optic nerve sheath of left eye 01/23/2020   Hyperlipidemia    Diet Controlled    NIDDM (non-insulin dependent diabetes mellitus)    Osteopenia 03/12/2021   Phlebitis    Strep pharyngitis    Vaginal atrophy    Vertigo     Past Surgical History:  Procedure Laterality Date   CERVICAL CONE BIOPSY     GLAUCOMA SURGERY  12/04 & 07/05/03   left eye cataract surgery  01/29/2009   right eye cataract surgery  01/13/09   VARICOSE VEIN SURGERY     both legs     Social History   Socioeconomic History   Marital status: Widowed    Spouse name: Not on file   Number of children: 2  Years of education: 7   Highest education level: 7th grade  Occupational History   Occupation: Retired  Tobacco Use   Smoking status: Never   Smokeless tobacco: Never  Vaping Use   Vaping Use: Never used  Substance and Sexual Activity   Alcohol use: No   Drug use: No   Sexual activity: Not Currently  Other Topics Concern   Not on file  Social History Narrative   Her daughter lives with her; She has 2 dogs she takes care of.   Social Determinants of Health   Financial Resource Strain: Low Risk    Difficulty of Paying Living Expenses: Not hard at all  Food Insecurity: No Food Insecurity   Worried About Charity fundraiser in the Last Year: Never true   [place] in the Last Year: Never true  Transportation Needs: No Transportation Needs   Lack of Transportation (Medical): No   Lack of Transportation (Non-Medical): No  Physical Activity: Inactive   Days of Exercise per Week: 0 days   Minutes of Exercise per Session: 0 min  Stress: No Stress Concern Present   Feeling of Stress : Not at all  Social Connections: Socially Isolated   Frequency of Communication  with Friends and Family: More than three times a week   Frequency of Social Gatherings with Friends and Family: Once a week   Attends Religious Services: Never   Marine scientist or Organizations: No   Attends Archivist Meetings: Never   Marital Status: Widowed  Human resources officer Violence: Not At Risk   Fear of Current or Ex-Partner: No   Emotionally Abused: No   Physically Abused: No   Sexually Abused: No        Objective:    BP 115/69   Pulse 66   Temp 97.6 F (36.4 C) (Temporal)   SpO2 97%   Wt Readings from Last 3 Encounters:  02/05/21 142 lb (64.4 kg)  12/04/20 146 lb 6 oz (66.4 kg)  10/02/20 151 lb (68.5 kg)    Physical Exam Vitals reviewed.  Constitutional:      General: She is not in acute distress.    Appearance: Normal appearance. She is not ill-appearing, toxic-appearing or diaphoretic.  HENT:     Head: Normocephalic and atraumatic.  Eyes:     General: No scleral icterus.       Right eye: No discharge.        Left eye: No discharge.     Conjunctiva/sclera: Conjunctivae normal.  Cardiovascular:     Rate and Rhythm: Normal rate and regular rhythm.     Heart sounds: Normal heart sounds. No murmur heard.   No friction rub. No gallop.  Pulmonary:     Effort: Pulmonary effort is normal. No respiratory distress.     Breath sounds: Normal breath sounds. No stridor. No wheezing, rhonchi or rales.  Musculoskeletal:        General: Normal range of motion.     Cervical back: Normal range of motion.     Comments: Varicose veins bilaterally.  Skin:    General: Skin is warm and dry.     Capillary Refill: Capillary refill takes less than 2 seconds.  Neurological:     General: No focal deficit present.     Mental Status: She is alert and oriented to person, place, and time. Mental status is at baseline.     Motor: Weakness: riding in wheelchair currently.     Gait:  Gait abnormal.  Psychiatric:        Mood and Affect: Mood normal.        Behavior:  Behavior normal.        Thought Content: Thought content normal.        Judgment: Judgment normal.    Lab Results  Component Value Date   TSH 2.290 09/12/2020   Lab Results  Component Value Date   WBC 5.3 03/12/2021   HGB 11.0 (L) 03/12/2021   HCT 32.5 (L) 03/12/2021   MCV 85 03/12/2021   PLT 247 03/12/2021   Lab Results  Component Value Date   NA 138 03/12/2021   K 4.2 03/12/2021   CO2 24 03/12/2021   GLUCOSE 106 (H) 03/12/2021   BUN 19 03/12/2021   CREATININE 0.99 03/12/2021   BILITOT 0.4 03/12/2021   ALKPHOS 66 03/12/2021   AST 19 03/12/2021   ALT 25 03/12/2021   PROT 6.8 03/12/2021   ALBUMIN 4.3 03/12/2021   CALCIUM 9.9 03/12/2021   EGFR 58 (L) 03/12/2021   Lab Results  Component Value Date   CHOL 141 03/12/2021   Lab Results  Component Value Date   HDL 69 03/12/2021   Lab Results  Component Value Date   LDLCALC 57 03/12/2021   Lab Results  Component Value Date   TRIG 74 03/12/2021   Lab Results  Component Value Date   CHOLHDL 2.0 03/12/2021   Lab Results  Component Value Date   HGBA1C 5.7 (H) 03/12/2021

## 2021-05-22 LAB — URINALYSIS, ROUTINE W REFLEX MICROSCOPIC
Bilirubin, UA: NEGATIVE
Glucose, UA: NEGATIVE
Nitrite, UA: POSITIVE — AB
Specific Gravity, UA: >1.03 — ABNORMAL HIGH (ref 1.005–1.030)
Urobilinogen, Ur: 0.2 mg/dL (ref 0.2–1.0)
pH, UA: 6 (ref 5.0–7.5)

## 2021-05-22 LAB — MICROSCOPIC EXAMINATION
Epithelial Cells (non renal): NONE SEEN /hpf (ref 0–10)
Renal Epithel, UA: NONE SEEN /hpf

## 2021-05-22 MED ORDER — CEFDINIR 300 MG PO CAPS: 300.0000 mg | ORAL_CAPSULE | Freq: Two times a day (BID) | ORAL | 0 refills | Status: AC

## 2021-05-24 LAB — COMPLIANCE DRUG ANALYSIS, UR

## 2021-05-26 ENCOUNTER — Encounter: Payer: Self-pay | Admitting: Family Medicine

## 2021-05-26 DIAGNOSIS — M5441 Lumbago with sciatica, right side: Secondary | ICD-10-CM | POA: Insufficient documentation

## 2021-05-26 DIAGNOSIS — G8929 Other chronic pain: Secondary | ICD-10-CM | POA: Insufficient documentation

## 2021-05-26 DIAGNOSIS — I83813 Varicose veins of bilateral lower extremities with pain: Secondary | ICD-10-CM | POA: Insufficient documentation

## 2021-05-26 DIAGNOSIS — Z79899 Other long term (current) drug therapy: Secondary | ICD-10-CM | POA: Insufficient documentation

## 2021-05-26 DIAGNOSIS — M4802 Spinal stenosis, cervical region: Secondary | ICD-10-CM | POA: Insufficient documentation

## 2021-05-26 DIAGNOSIS — N3941 Urge incontinence: Secondary | ICD-10-CM | POA: Insufficient documentation

## 2021-05-29 LAB — URINE CULTURE

## 2021-06-30 ENCOUNTER — Other Ambulatory Visit: Payer: Self-pay

## 2021-06-30 DIAGNOSIS — I8390 Asymptomatic varicose veins of unspecified lower extremity: Secondary | ICD-10-CM

## 2021-07-16 ENCOUNTER — Other Ambulatory Visit: Payer: Self-pay

## 2021-07-16 ENCOUNTER — Ambulatory Visit: Payer: Medicare Other | Admitting: Physician Assistant

## 2021-07-16 ENCOUNTER — Ambulatory Visit (HOSPITAL_COMMUNITY)
Admission: RE | Admit: 2021-07-16 | Discharge: 2021-07-16 | Disposition: A | Discharge status: Home / Self Care | Payer: Medicare Other | Source: Ambulatory Visit | Attending: Vascular Surgery | Admitting: Vascular Surgery

## 2021-07-16 VITALS — BP 107/67 | HR 70 | Temp 98.0°F | Ht 67.0 in

## 2021-07-16 DIAGNOSIS — I8390 Asymptomatic varicose veins of unspecified lower extremity: Secondary | ICD-10-CM | POA: Diagnosis not present

## 2021-07-16 DIAGNOSIS — I872 Venous insufficiency (chronic) (peripheral): Secondary | ICD-10-CM

## 2021-07-16 NOTE — Progress Notes (Signed)
Office Note     CC:  follow up Requesting Provider:  Loman Brooklyn, FNP  HPI: Priscilla Daniels is a 81 y.o. (07/05/1940) female who presents for evaluation of bilateral lower extremity weakness.  She underwent stab phlebectomy back in 1999 by Dr. Donnetta Hutching.  She is here with her daughter who states she has been seen by neurosurgery for leg weakness and was referred here to evaluate circulation.  She denies any edema, prior DVT, venous ulcerations, trauma, or prior vascular interventions.  She is ambulatory with a walker in the house however uses a wheelchair out of the house.  She denies tobacco use.   Past Medical History:  Diagnosis Date   Abnormal vaginal Pap smear    Candidiasis    cuteneous    Chronic UTI    COPD (chronic obstructive pulmonary disease) ([place]) 2010   Dx by CXR    DM (diabetes mellitus) ([place])    Fatigue    Glaucoma    Hemorrhage in optic nerve sheath of left eye 01/23/2020   Hyperlipidemia    Diet Controlled    NIDDM (non-insulin dependent diabetes mellitus)    Osteopenia 03/12/2021   Phlebitis    Strep pharyngitis    Vaginal atrophy    Vertigo     Past Surgical History:  Procedure Laterality Date   CERVICAL CONE BIOPSY     GLAUCOMA SURGERY  12/04 & 07/05/03   left eye cataract surgery  01/29/2009   right eye cataract surgery  01/13/09   VARICOSE VEIN SURGERY     both legs     Social History   Socioeconomic History   Marital status: Widowed    Spouse name: Not on file   Number of children: 2   Years of education: 7   Highest education level: 7th grade  Occupational History   Occupation: Retired  Tobacco Use   Smoking status: Never   Smokeless tobacco: Never  Vaping Use   Vaping Use: Never used  Substance and Sexual Activity   Alcohol use: No   Drug use: No   Sexual activity: Not Currently  Other Topics Concern   Not on file  Social History Narrative   Her daughter lives with her; She has 2 dogs she takes care of.   Social Determinants  of Health   Financial Resource Strain: Low Risk    Difficulty of Paying Living Expenses: Not hard at all  Food Insecurity: No Food Insecurity   Worried About Charity fundraiser in the Last Year: Never true   [place] in the Last Year: Never true  Transportation Needs: No Transportation Needs   Lack of Transportation (Medical): No   Lack of Transportation (Non-Medical): No  Physical Activity: Inactive   Days of Exercise per Week: 0 days   Minutes of Exercise per Session: 0 min  Stress: No Stress Concern Present   Feeling of Stress : Not at all  Social Connections: Socially Isolated   Frequency of Communication with Friends and Family: More than three times a week   Frequency of Social Gatherings with Friends and Family: Once a week   Attends Religious Services: Never   Marine scientist or Organizations: No   Attends Archivist Meetings: Never   Marital Status: Widowed  Human resources officer Violence: Not At Risk   Fear of Current or Ex-Partner: No   Emotionally Abused: No   Physically Abused: No   Sexually Abused: No    Family  History  Problem Relation Age of Onset   Stroke Mother    Cancer Father    Cancer Sister    Diabetes Brother    Hyperlipidemia Brother    Hyperlipidemia Brother     Current Outpatient Medications  Medication Sig Dispense Refill   aspirin 81 MG EC tablet Take 81 mg by mouth daily. Swallow whole.     Calcium Carb-Cholecalciferol (CALCIUM 600/VITAMIN D PO) Take by mouth. With 2000U vitamin d , 2 tablets daily     dorzolamide (TRUSOPT) 2 % ophthalmic solution 2 (two) times daily.     lisinopril-hydrochlorothiazide (ZESTORETIC) 20-12.5 MG tablet TAKE ONE (1) TABLET EACH DAY 90 tablet 1   meloxicam (MOBIC) 15 MG tablet TAKE ONE (1) TABLET BY MOUTH EVERY DAY 90 tablet 1   mirabegron ER (MYRBETRIQ) 25 MG TB24 tablet Take 1 tablet (25 mg total) by mouth daily. 30 tablet 2   rosuvastatin (CRESTOR) 10 MG tablet TAKE ONE (1) TABLET BY MOUTH  EVERY DAY 90 tablet 1   traMADol (ULTRAM) 50 MG tablet Take 1 tablet (50 mg total) by mouth 2 (two) times daily as needed. 60 tablet 2   TRAVATAN Z 0.004 % SOLN ophthalmic solution      No current facility-administered medications for this visit.    Allergies  Allergen Reactions   Lipitor [Atorvastatin Calcium]    Septra [Bactrim]    Sulfa Antibiotics      REVIEW OF SYSTEMS:   [X]  denotes positive finding, [ ]  denotes negative finding Cardiac  Comments:  Chest pain or chest pressure:    Shortness of breath upon exertion:    Short of breath when lying flat:    Irregular heart rhythm:        Vascular    Pain in calf, thigh, or hip brought on by ambulation:    Pain in feet at night that wakes you up from your sleep:     Blood clot in your veins:    Leg swelling:         Pulmonary    Oxygen at home:    Productive cough:     Wheezing:         Neurologic    Sudden weakness in arms or legs:     Sudden numbness in arms or legs:     Sudden onset of difficulty speaking or slurred speech:    Temporary loss of vision in one eye:     Problems with dizziness:         Gastrointestinal    Blood in stool:     Vomited blood:         Genitourinary    Burning when urinating:     Blood in urine:        Psychiatric    Major depression:         Hematologic    Bleeding problems:    Problems with blood clotting too easily:        Skin    Rashes or ulcers:        Constitutional    Fever or chills:      PHYSICAL EXAMINATION:  Vitals:   07/16/21 1026  BP: 107/67  Pulse: 70  Temp: 98 F (36.7 C)  Height: 5\' 7"  (1.702 m)    General:  WDWN in NAD; vital signs documented above Gait: Not observed HENT: WNL, normocephalic Pulmonary: normal non-labored breathing , without Rales, rhonchi,  wheezing Cardiac: regular HR Abdomen: soft, NT, no masses Skin: without  rashes Vascular Exam/Pulses:  Right Left  Radial 2+ (normal) 2+ (normal)  DP 2+ (normal) 2+ (normal)    Extremities: without ischemic changes, without Gangrene , without cellulitis; without open wounds;  Musculoskeletal: no muscle wasting or atrophy  Neurologic: A&O X 3;  No focal weakness or paresthesias are detected Psychiatric:  The pt has Normal affect.   Non-Invasive Vascular Imaging:   Bilateral lower extremity venous reflux study negative for DVT Negative for deep venous reflux bilateral lower extremity GSV is incompetent and dilated bilaterally    ASSESSMENT/PLAN:: 81 y.o. female with bilateral lower extremity leg weakness; history of stab phlebectomy of bilateral lower extremities  -Bilateral lower extremity venous reflux study is negative for DVT.  She does have GSV reflux bilaterally however this would not explain bilateral lower extremity weakness.  She also has palpable and symmetrical DP pulses which rules out arterial insufficiency.  From a vascular surgery standpoint it is unclear the etiology of lower extremity weakness.  I would not offer her further work-up for laser ablation therapy as this will not improve the strength in her lower legs.  Patient is also not complaining of edema of bilateral lower extremities.  She will be referred back to her primary care physician for further evaluation.  She may follow-up on an as-needed basis.   Dagoberto Ligas, PA-C [facility] [phone]  Clinic MD:   Donzetta Matters

## 2021-07-18 ENCOUNTER — Other Ambulatory Visit: Payer: Self-pay | Admitting: Family Medicine

## 2021-07-18 DIAGNOSIS — N3941 Urge incontinence: Secondary | ICD-10-CM

## 2021-07-18 DIAGNOSIS — M1990 Unspecified osteoarthritis, unspecified site: Secondary | ICD-10-CM

## 2021-08-20 ENCOUNTER — Other Ambulatory Visit: Payer: Self-pay | Admitting: Family Medicine

## 2021-08-20 ENCOUNTER — Encounter: Payer: Self-pay | Admitting: Family Medicine

## 2021-08-20 ENCOUNTER — Ambulatory Visit (INDEPENDENT_AMBULATORY_CARE_PROVIDER_SITE_OTHER): Payer: Medicare Other | Admitting: Family Medicine

## 2021-08-20 VITALS — BP 166/87 | HR 68 | Temp 98.2°F

## 2021-08-20 DIAGNOSIS — N3000 Acute cystitis without hematuria: Secondary | ICD-10-CM

## 2021-08-20 DIAGNOSIS — E785 Hyperlipidemia, unspecified: Secondary | ICD-10-CM

## 2021-08-20 DIAGNOSIS — I10 Essential (primary) hypertension: Secondary | ICD-10-CM

## 2021-08-20 DIAGNOSIS — R7303 Prediabetes: Secondary | ICD-10-CM | POA: Diagnosis not present

## 2021-08-20 DIAGNOSIS — Z79899 Other long term (current) drug therapy: Secondary | ICD-10-CM | POA: Diagnosis not present

## 2021-08-20 DIAGNOSIS — M8589 Other specified disorders of bone density and structure, multiple sites: Secondary | ICD-10-CM | POA: Diagnosis not present

## 2021-08-20 DIAGNOSIS — M1990 Unspecified osteoarthritis, unspecified site: Secondary | ICD-10-CM | POA: Diagnosis not present

## 2021-08-20 DIAGNOSIS — M5441 Lumbago with sciatica, right side: Secondary | ICD-10-CM | POA: Diagnosis not present

## 2021-08-20 DIAGNOSIS — I700 Atherosclerosis of aorta: Secondary | ICD-10-CM

## 2021-08-20 DIAGNOSIS — M4802 Spinal stenosis, cervical region: Secondary | ICD-10-CM | POA: Diagnosis not present

## 2021-08-20 DIAGNOSIS — N1831 Chronic kidney disease, stage 3a: Secondary | ICD-10-CM | POA: Diagnosis not present

## 2021-08-20 DIAGNOSIS — R8290 Unspecified abnormal findings in urine: Secondary | ICD-10-CM | POA: Diagnosis not present

## 2021-08-20 DIAGNOSIS — G8929 Other chronic pain: Secondary | ICD-10-CM

## 2021-08-20 DIAGNOSIS — E559 Vitamin D deficiency, unspecified: Secondary | ICD-10-CM

## 2021-08-20 DIAGNOSIS — N3941 Urge incontinence: Secondary | ICD-10-CM

## 2021-08-20 LAB — URINALYSIS, ROUTINE W REFLEX MICROSCOPIC
Bilirubin, UA: NEGATIVE
Glucose, UA: NEGATIVE
Nitrite, UA: POSITIVE — AB
RBC, UA: NEGATIVE
Specific Gravity, UA: 1.02 (ref 1.005–1.030)
Urobilinogen, Ur: 0.2 mg/dL (ref 0.2–1.0)
pH, UA: 7.5 (ref 5.0–7.5)

## 2021-08-20 LAB — MICROSCOPIC EXAMINATION
RBC, Urine: NONE SEEN /hpf (ref 0–2)
Renal Epithel, UA: NONE SEEN /hpf

## 2021-08-20 LAB — BAYER DCA HB A1C WAIVED: HB A1C (BAYER DCA - WAIVED): 5.8 % — ABNORMAL HIGH (ref 4.8–5.6)

## 2021-08-20 LAB — LIPID PANEL

## 2021-08-20 MED ORDER — CEPHALEXIN 500 MG PO CAPS: 500.0000 mg | ORAL_CAPSULE | Freq: Two times a day (BID) | ORAL | 0 refills | Status: AC

## 2021-08-20 MED ORDER — ROSUVASTATIN CALCIUM 10 MG PO TABS: 10.0000 mg | ORAL_TABLET | Freq: Every day | ORAL | 1 refills | Status: DC

## 2021-08-20 MED ORDER — LISINOPRIL-HYDROCHLOROTHIAZIDE 20-12.5 MG PO TABS: 1.0000 | ORAL_TABLET | Freq: Every day | ORAL | 1 refills | Status: DC

## 2021-08-20 MED ORDER — MIRABEGRON ER 50 MG PO TB24: 50.0000 mg | ORAL_TABLET | Freq: Every day | ORAL | 2 refills | Status: DC

## 2021-08-20 MED ORDER — ACETAMINOPHEN-CODEINE #2 300-15 MG PO TABS: 1.0000 | ORAL_TABLET | ORAL | 0 refills | Status: DC | PRN

## 2021-08-20 NOTE — Patient Instructions (Signed)
Please monitor your blood pressure at home. If you are consistently > 150/90 you need to let me know. ?

## 2021-08-20 NOTE — Progress Notes (Signed)
Assessment & Plan:  1. Essential hypertension Encouraged patient to monitor her blood pressure at home and let me know if she is consistently > 150/90. - Lipid panel - CBC with Differential/Platelet - CMP14+EGFR - lisinopril-hydrochlorothiazide (ZESTORETIC) 20-12.5 MG tablet; Take 1 tablet by mouth daily.  Dispense: 90 tablet; Refill: 1  2. Hyperlipidemia with target LDL less than 100 Well controlled on current regimen.  - Lipid panel - CBC with Differential/Platelet - CMP14+EGFR - rosuvastatin (CRESTOR) 10 MG tablet; Take 1 tablet (10 mg total) by mouth daily.  Dispense: 90 tablet; Refill: 1  3. Atherosclerosis of aorta (HCC) Continue rosuvastatin and aspirin. - Lipid panel - CMP14+EGFR - rosuvastatin (CRESTOR) 10 MG tablet; Take 1 tablet (10 mg total) by mouth daily.  Dispense: 90 tablet; Refill: 1  4. Prediabetes Controlled with A1c of 5.8. - Bayer DCA Hb A1c Waived  5-8. Arthritis/Chronic right-sided low back pain with right-sided sciatica/Neural foraminal stenosis of cervical spine/Controlled substance agreement signed Uncontrolled pain.  Discontinue Tramadol as patient is not taking since it makes her sleep all day.  We will try Tylenol with codeine.  Controlled substance agreement and urine drug screen already completed.  PDMP reviewed with no concerning findings. - CMP14+EGFR - acetaminophen-codeine (TYLENOL #2) 300-15 MG tablet; Take 1 tablet by mouth every 4 (four) hours as needed for moderate pain.  Dispense: 30 tablet; Refill: 0  9. Osteopenia of multiple sites Continue calcium and vitamin D supplements.  Plan to recheck DEXA in 2025.  10. Urge incontinence of urine Improving.  Myrbetriq increased from 25 mg to 50 mg daily. - CMP14+EGFR - mirabegron ER (MYRBETRIQ) 50 MG TB24 tablet; Take 1 tablet (50 mg total) by mouth daily.  Dispense: 30 tablet; Refill: 2  11. Stage 3a chronic kidney disease (HCC) - CMP14+EGFR  12. Vitamin D insufficiency Continue OTC  supplement.  13. Abnormal urine odor - Urinalysis, Routine w reflex microscopic - Urine dipstick shows positive for protein, positive for nitrates, positive for leukocytes, and positive for ketones.  Micro exam: many bacteria. - Microscopic Examination  14. Acute cystitis without hematuria Education provided on UTIs. Encouraged adequate hydration.  Encouraged a referral to urology due to recurrent UTIs, but patient declined. - cephALEXin (KEFLEX) 500 MG capsule; Take 1 capsule (500 mg total) by mouth 2 (two) times daily for 10 days.  Dispense: 20 capsule; Refill: 0 - Urine Culture   Return in about 4 weeks (around 09/17/2021) for Pain.  Hendricks Limes, MSN, APRN, FNP-C [facility]  Subjective:    Patient ID: Priscilla Daniels, female    DOB: 1941-03-06, 81 y.o.   MRN: [id number]  Patient Care Team: Loman Brooklyn, FNP as PCP - General (Family Medicine) Okey Regal, [place] as Referring Physician (Optometry)   Chief Complaint:  Chief Complaint  Patient presents with   Medical Management of Chronic Issues   urine odor    X 2 weeks     HPI: Priscilla Daniels is a 81 y.o. female presenting on 08/20/2021 for Medical Management of Chronic Issues and urine odor (X 2 weeks )  Pain assessment: Cause of pain- C2-C3 severe bilateral neural foraminal narrowing; mild neural foraminal narrowing of C4-C5, C5-C6, and C6-C7; L3-L4 moderate to severe subarticular recess stenosis with potential impingement of the L4 nerve roots with mild central canal and right foraminal stenosis; and L4-L5 moderate subarticular recess stenosis with potential impingement of the L5 nerve roots with mild foraminal stenosis.  Most recent MRIs completed on 02/21/2021.  Pain location- neck, back, right side, and head Pain on scale of 1-10- 6-7/10 without medication; 0/10 with medication Frequency- every other day What increases pain- movement What makes pain better- rest and medication Effects on ADL  -very limited due to pain.  When all of this started patient was not walking at all.  She has made some progress with physical therapy, cervical spine injections, and pain medication.   Any change in general medical condition- no.    Current opioids rx- tramadol 50 mg twice daily as needed.  # meds rx- 60 Effectiveness of current meds- worked well but caused side effects her to sleep all day Adverse reactions from pain meds- patient reports all she did was sleep all day when she took the medication, so she stopped. Morphine equivalent- 10 MME  Pill count performed-No Last drug screen - 05/21/2021 ( high risk q32m moderate risk q637mlow risk yearly ) Urine drug screen today- No Was the [place]- Yes If yes were their any concerning findings? - No  Overdose risk: 90  Opioid Risk  05/21/2021  Alcohol 0  Illegal Drugs 0  Rx Drugs 0  Alcohol 0  Illegal Drugs 0  Rx Drugs 0  Age between 16-45 years  0  History of Preadolescent Sexual Abuse 0  Psychological Disease 0  Depression 0  Opioid Risk Tool Scoring 0  Opioid Risk Interpretation Low Risk   Pain contract signed on: 05/21/2021  Hypertension: She has previously been well controlled on lisinopril 204mCTZ 12.5 mg. She does not check her BP at home, but does have a cuff to do so.  Atherosclerosis of aorta/Hyperlipidemia: taking Rosuvastatin 10 mg. She is tolerating it well and takes it daily.  CKD: Stage IIIa.   Prediabetes: Last A1c 5.7 in September 2022.  Vitamin D insufficiency: Resolved with OTC calcium and vitamin D supplement.  Osteopenia: Patient is taking a calcium and vitamin D supplement.  Her last DEXA scan was completed on 03/12/2021.  Urge Incontinence of Urine: reports she is doing better with Myrbetriq and can definitely tell a difference, but still has some accidents.   New complaints: Patient complains of abnormal smelling urine, dysuria, frequency, and urgency. She has had symptoms for 2 weeks.  Patient denies fever. Patient does have a history of recurrent UTI.  Patient does not have a history of pyelonephritis.    Social history:  Relevant past medical, surgical, family and social history reviewed and updated as indicated. Interim medical history since our last visit reviewed.  Allergies and medications reviewed and updated.  DATA REVIEWED: CHART IN EPIC  ROS: Negative unless specifically indicated above in HPI.    Current Outpatient Medications:    aspirin 81 MG EC tablet, Take 81 mg by mouth daily. Swallow whole., Disp: , Rfl:    Calcium Carb-Cholecalciferol (CALCIUM 600/VITAMIN D PO), Take by mouth. With 2000U vitamin d , 2 tablets daily, Disp: , Rfl:    dorzolamide (TRUSOPT) 2 % ophthalmic solution, 2 (two) times daily., Disp: , Rfl:    lisinopril-hydrochlorothiazide (ZESTORETIC) 20-12.5 MG tablet, TAKE ONE (1) TABLET EACH DAY, Disp: 90 tablet, Rfl: 1   meloxicam (MOBIC) 15 MG tablet, TAKE ONE (1) TABLET BY MOUTH EVERY DAY, Disp: 90 tablet, Rfl: 0   MYRBETRIQ 25 MG TB24 tablet, TAKE ONE (1) TABLET BY MOUTH EVERY DAY, Disp: 30 tablet, Rfl: 1   rosuvastatin (CRESTOR) 10 MG tablet, TAKE ONE (1) TABLET BY MOUTH EVERY DAY, Disp: 90 tablet, Rfl: 1   TRAVATAN Z  0.004 % SOLN ophthalmic solution, , Disp: , Rfl:    traMADol (ULTRAM) 50 MG tablet, Take 1 tablet (50 mg total) by mouth 2 (two) times daily as needed. (Patient not taking: Reported on 08/20/2021), Disp: 60 tablet, Rfl: 2   Allergies  Allergen Reactions   Lipitor [Atorvastatin Calcium]    Septra [Bactrim]    Sulfa Antibiotics    Past Medical History:  Diagnosis Date   Abnormal vaginal Pap smear    Candidiasis    cuteneous    Chronic UTI    COPD (chronic obstructive pulmonary disease) ([place]) 2010   Dx by CXR    DM (diabetes mellitus) ([place])    Fatigue    Glaucoma    Hemorrhage in optic nerve sheath of left eye 01/23/2020   Hyperlipidemia    Diet Controlled    NIDDM (non-insulin dependent diabetes mellitus)     Osteopenia 03/12/2021   Phlebitis    Strep pharyngitis    Vaginal atrophy    Vertigo     Past Surgical History:  Procedure Laterality Date   CERVICAL CONE BIOPSY     GLAUCOMA SURGERY  12/04 & 07/05/03   left eye cataract surgery  01/29/2009   right eye cataract surgery  01/13/09   VARICOSE VEIN SURGERY     both legs     Social History   Socioeconomic History   Marital status: Widowed    Spouse name: Not on file   Number of children: 2   Years of education: 7   Highest education level: 7th grade  Occupational History   Occupation: Retired  Tobacco Use   Smoking status: Never   Smokeless tobacco: Never  Vaping Use   Vaping Use: Never used  Substance and Sexual Activity   Alcohol use: No   Drug use: No   Sexual activity: Not Currently  Other Topics Concern   Not on file  Social History Narrative   Her daughter lives with her; She has 2 dogs she takes care of.   Social Determinants of Health   Financial Resource Strain: Low Risk    Difficulty of Paying Living Expenses: Not hard at all  Food Insecurity: No Food Insecurity   Worried About Charity fundraiser in the Last Year: Never true   [place] in the Last Year: Never true  Transportation Needs: No Transportation Needs   Lack of Transportation (Medical): No   Lack of Transportation (Non-Medical): No  Physical Activity: Inactive   Days of Exercise per Week: 0 days   Minutes of Exercise per Session: 0 min  Stress: No Stress Concern Present   Feeling of Stress : Not at all  Social Connections: Socially Isolated   Frequency of Communication with Friends and Family: More than three times a week   Frequency of Social Gatherings with Friends and Family: Once a week   Attends Religious Services: Never   Marine scientist or Organizations: No   Attends Archivist Meetings: Never   Marital Status: Widowed  Human resources officer Violence: Not At Risk   Fear of Current or Ex-Partner: No   Emotionally  Abused: No   Physically Abused: No   Sexually Abused: No        Objective:    BP (!) 166/87    Pulse 68    Temp 98.2 F (36.8 C) (Temporal)    SpO2 97%   Wt Readings from Last 3 Encounters:  02/05/21 142 lb (64.4 kg)  12/04/20  146 lb 6 oz (66.4 kg)  10/02/20 151 lb (68.5 kg)    Physical Exam Vitals reviewed.  Constitutional:      General: She is not in acute distress.    Appearance: Normal appearance. She is not ill-appearing, toxic-appearing or diaphoretic.  HENT:     Head: Normocephalic and atraumatic.  Eyes:     General: No scleral icterus.       Right eye: No discharge.        Left eye: No discharge.     Conjunctiva/sclera: Conjunctivae normal.  Cardiovascular:     Rate and Rhythm: Normal rate and regular rhythm.     Heart sounds: Normal heart sounds. No murmur heard.   No friction rub. No gallop.  Pulmonary:     Effort: Pulmonary effort is normal. No respiratory distress.     Breath sounds: Normal breath sounds. No stridor. No wheezing, rhonchi or rales.  Musculoskeletal:        General: Normal range of motion.     Cervical back: Normal range of motion.     Comments: Varicose veins bilaterally.  Skin:    General: Skin is warm and dry.     Capillary Refill: Capillary refill takes less than 2 seconds.  Neurological:     General: No focal deficit present.     Mental Status: She is alert and oriented to person, place, and time. Mental status is at baseline.     Motor: Weakness: riding in wheelchair currently.     Gait: Gait abnormal.  Psychiatric:        Mood and Affect: Mood normal.        Behavior: Behavior normal.        Thought Content: Thought content normal.        Judgment: Judgment normal.    Lab Results  Component Value Date   TSH 2.290 09/12/2020   Lab Results  Component Value Date   WBC 5.3 03/12/2021   HGB 11.0 (L) 03/12/2021   HCT 32.5 (L) 03/12/2021   MCV 85 03/12/2021   PLT 247 03/12/2021   Lab Results  Component Value Date   NA 138  03/12/2021   K 4.2 03/12/2021   CO2 24 03/12/2021   GLUCOSE 106 (H) 03/12/2021   BUN 19 03/12/2021   CREATININE 0.99 03/12/2021   BILITOT 0.4 03/12/2021   ALKPHOS 66 03/12/2021   AST 19 03/12/2021   ALT 25 03/12/2021   PROT 6.8 03/12/2021   ALBUMIN 4.3 03/12/2021   CALCIUM 9.9 03/12/2021   EGFR 58 (L) 03/12/2021   Lab Results  Component Value Date   CHOL 141 03/12/2021   Lab Results  Component Value Date   HDL 69 03/12/2021   Lab Results  Component Value Date   LDLCALC 57 03/12/2021   Lab Results  Component Value Date   TRIG 74 03/12/2021   Lab Results  Component Value Date   CHOLHDL 2.0 03/12/2021   Lab Results  Component Value Date   HGBA1C 5.7 (H) 03/12/2021

## 2021-08-21 LAB — CBC WITH DIFFERENTIAL/PLATELET
Basophils Absolute: 0.1 10*3/uL (ref 0.0–0.2)
Basos: 1 %
EOS (ABSOLUTE): 0.4 10*3/uL (ref 0.0–0.4)
Eos: 5 %
Hematocrit: 35.6 % (ref 34.0–46.6)
Hemoglobin: 12 g/dL (ref 11.1–15.9)
Immature Grans (Abs): 0 10*3/uL (ref 0.0–0.1)
Immature Granulocytes: 0 %
Lymphocytes Absolute: 2 10*3/uL (ref 0.7–3.1)
Lymphs: 28 %
MCH: 28.6 pg (ref 26.6–33.0)
MCHC: 33.7 g/dL (ref 31.5–35.7)
MCV: 85 fL (ref 79–97)
Monocytes Absolute: 0.5 10*3/uL (ref 0.1–0.9)
Monocytes: 8 %
Neutrophils Absolute: 4.1 10*3/uL (ref 1.4–7.0)
Neutrophils: 58 %
Platelets: 245 10*3/uL (ref 150–450)
RBC: 4.19 x10E6/uL (ref 3.77–5.28)
RDW: 11.4 % — ABNORMAL LOW (ref 11.7–15.4)
WBC: 7 10*3/uL (ref 3.4–10.8)

## 2021-08-21 LAB — CMP14+EGFR
ALT: 77 IU/L — ABNORMAL HIGH (ref 0–32)
AST: 45 IU/L — ABNORMAL HIGH (ref 0–40)
Albumin/Globulin Ratio: 2.1 (ref 1.2–2.2)
Albumin: 4.4 g/dL (ref 3.7–4.7)
Alkaline Phosphatase: 153 IU/L — ABNORMAL HIGH (ref 44–121)
BUN/Creatinine Ratio: 25 (ref 12–28)
BUN: 24 mg/dL (ref 8–27)
Bilirubin Total: 0.4 mg/dL (ref 0.0–1.2)
CO2: 25 mmol/L (ref 20–29)
Calcium: 10.1 mg/dL (ref 8.7–10.3)
Chloride: 100 mmol/L (ref 96–106)
Creatinine, Ser: 0.96 mg/dL (ref 0.57–1.00)
Globulin, Total: 2.1 g/dL (ref 1.5–4.5)
Glucose: 111 mg/dL — ABNORMAL HIGH (ref 70–99)
Potassium: 4.2 mmol/L (ref 3.5–5.2)
Sodium: 138 mmol/L (ref 134–144)
Total Protein: 6.5 g/dL (ref 6.0–8.5)
eGFR: 60 mL/min/{1.73_m2} (ref >59)

## 2021-08-21 LAB — LIPID PANEL
Chol/HDL Ratio: 1.9 ratio (ref 0.0–4.4)
Cholesterol, Total: 145 mg/dL (ref 100–199)
HDL: 78 mg/dL (ref >39)
LDL Chol Calc (NIH): 51 mg/dL (ref 0–99)
Triglycerides: 88 mg/dL (ref 0–149)
VLDL Cholesterol Cal: 16 mg/dL (ref 5–40)

## 2021-08-27 LAB — URINE CULTURE

## 2021-09-09 ENCOUNTER — Other Ambulatory Visit: Payer: Self-pay | Admitting: Family Medicine

## 2021-09-24 ENCOUNTER — Ambulatory Visit (INDEPENDENT_AMBULATORY_CARE_PROVIDER_SITE_OTHER): Payer: Medicare Other | Admitting: Family Medicine

## 2021-09-24 ENCOUNTER — Encounter: Payer: Self-pay | Admitting: Family Medicine

## 2021-09-24 VITALS — BP 121/78 | HR 61 | Temp 97.8°F

## 2021-09-24 DIAGNOSIS — M5441 Lumbago with sciatica, right side: Secondary | ICD-10-CM | POA: Diagnosis not present

## 2021-09-24 DIAGNOSIS — M4802 Spinal stenosis, cervical region: Secondary | ICD-10-CM | POA: Diagnosis not present

## 2021-09-24 DIAGNOSIS — G8929 Other chronic pain: Secondary | ICD-10-CM

## 2021-09-24 DIAGNOSIS — M1990 Unspecified osteoarthritis, unspecified site: Secondary | ICD-10-CM | POA: Diagnosis not present

## 2021-09-24 DIAGNOSIS — I10 Essential (primary) hypertension: Secondary | ICD-10-CM

## 2021-09-24 NOTE — Progress Notes (Signed)
? ?Assessment & Plan:  ?1-3. Arthritis/Chronic right-sided low back pain with right-sided sciatica/Neural foraminal stenosis of cervical spine ?Patient prefers to just take Tylenol going forward.  ? ?4. Essential hypertension ?Well controlled on current regimen.  ? ? ?Return in about 3 months (around 12/24/2021) for annual physical. ? ?Hendricks Limes, MSN, APRN, FNP-C ?[place] ? ?Subjective:  ? ? Patient ID: Priscilla Daniels, female    DOB: 06-Oct-1940, 81 y.o.   MRN: [id number] ? ?Patient Care Team: ?Loman Brooklyn, FNP as PCP - General (Family Medicine) ?Okey Regal, OD as Referring Physician (Optometry)  ? ?Chief Complaint:  ?Chief Complaint  ?Patient presents with  ? Pain  ?  4 week re check- arthritis/ chronic rt sided lower back pain. Patient states she could not take the tylonal 2  ? ? ?HPI: ?Priscilla Daniels is a 81 y.o. female presenting on 09/24/2021 for Pain (4 week re check- arthritis/ chronic rt sided lower back pain. Patient states she could not take the tylonal 2) ? ?Pain assessment: ?Cause of pain- C2-C3 severe bilateral neural foraminal narrowing; mild neural foraminal narrowing of C4-C5, C5-C6, and C6-C7; L3-L4 moderate to severe subarticular recess stenosis with potential impingement of the L4 nerve roots with mild central canal and right foraminal stenosis; and L4-L5 moderate subarticular recess stenosis with potential impingement of the L5 nerve roots with mild foraminal stenosis.  Most recent MRIs completed on 02/21/2021. ?Pain location- neck, back, right side, and head ?Pain on scale of 1-10- 6-7/10 without medication; 0/10 with medication ?Frequency- every other day ?What increases pain- movement ?What makes pain better- rest and medication ?Effects on ADL -very limited due to pain.  When all of this started patient was not walking at all.  She has made some progress with physical therapy, cervical spine injections, and pain medication.   ?Any change in general medical  condition- no.   ? ?Current opioids rx- Tylenol #2 ?Effectiveness of current meds- patient reports after she took it for three days she was unable to get off the couch and felt it was due to the medication. She hasn't taken anymore since then and has felt fine. She previously tried Tramadol and reports all she did was sleep. She does not wish to try anything else for pain.  ? ?New complaints: ?None ? ? ?Social history: ? ?Relevant past medical, surgical, family and social history reviewed and updated as indicated. Interim medical history since our last visit reviewed. ? ?Allergies and medications reviewed and updated. ? ?DATA REVIEWED: CHART IN EPIC ? ?ROS: Negative unless specifically indicated above in HPI.  ? ? ?Current Outpatient Medications:  ?  aspirin 81 MG EC tablet, Take 81 mg by mouth daily. Swallow whole., Disp: , Rfl:  ?  Calcium Carb-Cholecalciferol (CALCIUM 600/VITAMIN D PO), Take by mouth. With 2000U vitamin d , 2 tablets daily, Disp: , Rfl:  ?  dorzolamide (TRUSOPT) 2 % ophthalmic solution, 2 (two) times daily., Disp: , Rfl:  ?  lisinopril-hydrochlorothiazide (ZESTORETIC) 20-12.5 MG tablet, Take 1 tablet by mouth daily., Disp: 90 tablet, Rfl: 1 ?  meloxicam (MOBIC) 15 MG tablet, TAKE ONE (1) TABLET BY MOUTH EVERY DAY, Disp: 90 tablet, Rfl: 0 ?  mirabegron ER (MYRBETRIQ) 50 MG TB24 tablet, Take 1 tablet (50 mg total) by mouth daily., Disp: 30 tablet, Rfl: 2 ?  rosuvastatin (CRESTOR) 10 MG tablet, Take 1 tablet (10 mg total) by mouth daily., Disp: 90 tablet, Rfl: 1 ?  TRAVATAN Z 0.004 % SOLN  ophthalmic solution, , Disp: , Rfl:  ?  acetaminophen-codeine (TYLENOL #2) 300-15 MG tablet, Take 1 tablet by mouth every 4 (four) hours as needed for moderate pain. (Patient not taking: Reported on 09/24/2021), Disp: 30 tablet, Rfl: 0  ? ?Allergies  ?Allergen Reactions  ? Lipitor [Atorvastatin Calcium]   ? Septra [Bactrim]   ? Sulfa Antibiotics   ? ?Past Medical History:  ?Diagnosis Date  ? Abnormal vaginal Pap  smear   ? Candidiasis   ? cuteneous   ? Chronic UTI   ? COPD (chronic obstructive pulmonary disease) ([place]) 2010  ? Dx by CXR   ? DM (diabetes mellitus) ([place])   ? Fatigue   ? Glaucoma   ? Hemorrhage in optic nerve sheath of left eye 01/23/2020  ? Hyperlipidemia   ? Diet Controlled   ? NIDDM (non-insulin dependent diabetes mellitus)   ? Osteopenia 03/12/2021  ? Phlebitis   ? Strep pharyngitis   ? Vaginal atrophy   ? Vertigo   ?  ?Past Surgical History:  ?Procedure Laterality Date  ? CERVICAL CONE BIOPSY    ? GLAUCOMA SURGERY  12/04 & 07/05/03  ? left eye cataract surgery  01/29/2009  ? right eye cataract surgery  01/13/09  ? VARICOSE VEIN SURGERY    ? both legs   ?  ?Social History  ? ?Socioeconomic History  ? Marital status: Widowed  ?  Spouse name: Not on file  ? Number of children: 2  ? Years of education: 7  ? Highest education level: 7th grade  ?Occupational History  ? Occupation: Retired  ?Tobacco Use  ? Smoking status: Never  ? Smokeless tobacco: Never  ?Vaping Use  ? Vaping Use: Never used  ?Substance and Sexual Activity  ? Alcohol use: No  ? Drug use: No  ? Sexual activity: Not Currently  ?Other Topics Concern  ? Not on file  ?Social History Narrative  ? Her daughter lives with her; She has 2 dogs she takes care of.  ? ?Social Determinants of Health  ? ?Financial Resource Strain: Low Risk   ? Difficulty of Paying Living Expenses: Not hard at all  ?Food Insecurity: No Food Insecurity  ? Worried About Charity fundraiser in the Last Year: Never true  ? Ran Out of Food in the Last Year: Never true  ?Transportation Needs: No Transportation Needs  ? Lack of Transportation (Medical): No  ? Lack of Transportation (Non-Medical): No  ?Physical Activity: Inactive  ? Days of Exercise per Week: 0 days  ? Minutes of Exercise per Session: 0 min  ?Stress: No Stress Concern Present  ? Feeling of Stress : Not at all  ?Social Connections: Socially Isolated  ? Frequency of Communication with Friends and Family: More than three  times a week  ? Frequency of Social Gatherings with Friends and Family: Once a week  ? Attends Religious Services: Never  ? Active Member of Clubs or Organizations: No  ? Attends Archivist Meetings: Never  ? Marital Status: Widowed  ?Intimate Partner Violence: Not At Risk  ? Fear of Current or Ex-Partner: No  ? Emotionally Abused: No  ? Physically Abused: No  ? Sexually Abused: No  ?  ? ?   ?Objective:  ?  ?BP 121/78   Pulse 61   Temp 97.8 ?F (36.6 ?C) (Temporal)   SpO2 98%  ? ?Wt Readings from Last 3 Encounters:  ?02/05/21 142 lb (64.4 kg)  ?12/04/20 146 lb 6 oz (66.4 kg)  ?  10/02/20 151 lb (68.5 kg)  ? ? ?Physical Exam ?Vitals reviewed.  ?Constitutional:   ?   General: She is not in acute distress. ?   Appearance: Normal appearance. She is not ill-appearing, toxic-appearing or diaphoretic.  ?HENT:  ?   Head: Normocephalic and atraumatic.  ?Eyes:  ?   General: No scleral icterus.    ?   Right eye: No discharge.     ?   Left eye: No discharge.  ?   Conjunctiva/sclera: Conjunctivae normal.  ?Cardiovascular:  ?   Rate and Rhythm: Normal rate and regular rhythm.  ?   Heart sounds: Normal heart sounds. No murmur heard. ?  No friction rub. No gallop.  ?Pulmonary:  ?   Effort: Pulmonary effort is normal. No respiratory distress.  ?   Breath sounds: Normal breath sounds. No stridor. No wheezing, rhonchi or rales.  ?Musculoskeletal:     ?   General: Normal range of motion.  ?   Cervical back: Normal range of motion.  ?   Comments: Varicose veins bilaterally.  ?Skin: ?   General: Skin is warm and dry.  ?   Capillary Refill: Capillary refill takes less than 2 seconds.  ?Neurological:  ?   General: No focal deficit present.  ?   Mental Status: She is alert and oriented to person, place, and time. Mental status is at baseline.  ?   Motor: Weakness: riding in wheelchair currently.  ?   Gait: Gait abnormal.  ?Psychiatric:     ?   Mood and Affect: Mood normal.     ?   Behavior: Behavior normal.     ?   Thought  Content: Thought content normal.     ?   Judgment: Judgment normal.  ? ? ?Lab Results  ?Component Value Date  ? TSH 2.290 09/12/2020  ? ?Lab Results  ?Component Value Date  ? WBC 7.0 08/20/2021  ? HGB 12.0 08/20/2021

## 2021-09-26 ENCOUNTER — Encounter: Payer: Self-pay | Admitting: Family Medicine

## 2021-10-01 ENCOUNTER — Other Ambulatory Visit: Payer: Self-pay | Admitting: Family Medicine

## 2021-10-01 DIAGNOSIS — M1990 Unspecified osteoarthritis, unspecified site: Secondary | ICD-10-CM

## 2021-10-02 NOTE — Patient Instructions (Signed)
Priscilla Daniels , ?Thank you for taking time to come for your Medicare Wellness Visit. I appreciate your ongoing commitment to your health goals. Please review the following plan we discussed and let me know if I can assist you in the future.  ? ?Screening recommendations/referrals: ?Colonoscopy: No longer required ?Mammogram: Done 2018 - no longer required ?Bone Density: Done 03/12/2021 - repeat in 3 years ?Recommended yearly ophthalmology/optometry visit for glaucoma screening and checkup ?Recommended yearly dental visit for hygiene and checkup ? ?Vaccinations: ?Influenza vaccine: Done 03/12/2021 - Repeat annually  ?Pneumococcal vaccine: Declined - consider once per lifetime Prevnar-20 ?Tdap vaccine: Declined - recommended every 10 years ?Shingles vaccine: Declined - Shingrix is 2 doses 2-6 months apart and over 90% effective     ?Covid-19:Done  09/13/2019, 10/11/2019, & 05/01/2020 ? ?Advanced directives: Please bring a copy of your health care power of attorney and living will to the office to be added to your chart at your convenience.  ? ?Conditions/risks identified: Aim for 30 minutes of exercise or walking, 6-8 glasses of water, and 5 servings of fruits and vegetables each day.  ? ?Next appointment: Follow up in one year for your annual wellness visit  ? ? ?Preventive Care 2 Years and Older, Female ?Preventive care refers to lifestyle choices and visits with your health care provider that can promote health and wellness. ?What does preventive care include? ?A yearly physical exam. This is also called an annual well check. ?Dental exams once or twice a year. ?Routine eye exams. Ask your health care provider how often you should have your eyes checked. ?Personal lifestyle choices, including: ?Daily care of your teeth and gums. ?Regular physical activity. ?Eating a healthy diet. ?Avoiding tobacco and drug use. ?Limiting alcohol use. ?Practicing safe sex. ?Taking low-dose aspirin every day. ?Taking vitamin and mineral  supplements as recommended by your health care provider. ?What happens during an annual well check? ?The services and screenings done by your health care provider during your annual well check will depend on your age, overall health, lifestyle risk factors, and family history of disease. ?Counseling  ?Your health care provider may ask you questions about your: ?Alcohol use. ?Tobacco use. ?Drug use. ?Emotional well-being. ?Home and relationship well-being. ?Sexual activity. ?Eating habits. ?History of falls. ?Memory and ability to understand (cognition). ?Work and work Astronomer. ?Reproductive health. ?Screening  ?You may have the following tests or measurements: ?Height, weight, and BMI. ?Blood pressure. ?Lipid and cholesterol levels. These may be checked every 5 years, or more frequently if you are over 50 years old. ?Skin check. ?Lung cancer screening. You may have this screening every year starting at age 11 if you have a 30-pack-year history of smoking and currently smoke or have quit within the past 15 years. ?Fecal occult blood test (FOBT) of the stool. You may have this test every year starting at age 34. ?Flexible sigmoidoscopy or colonoscopy. You may have a sigmoidoscopy every 5 years or a colonoscopy every 10 years starting at age 61. ?Hepatitis C blood test. ?Hepatitis B blood test. ?Sexually transmitted disease (STD) testing. ?Diabetes screening. This is done by checking your blood sugar (glucose) after you have not eaten for a while (fasting). You may have this done every 1-3 years. ?Bone density scan. This is done to screen for osteoporosis. You may have this done starting at age 8. ?Mammogram. This may be done every 1-2 years. Talk to your health care provider about how often you should have regular mammograms. ?Talk with your health care  provider about your test results, treatment options, and if necessary, the need for more tests. ?Vaccines  ?Your health care provider may recommend certain  vaccines, such as: ?Influenza vaccine. This is recommended every year. ?Tetanus, diphtheria, and acellular pertussis (Tdap, Td) vaccine. You may need a Td booster every 10 years. ?Zoster vaccine. You may need this after age 41. ?Pneumococcal 13-valent conjugate (PCV13) vaccine. One dose is recommended after age 56. ?Pneumococcal polysaccharide (PPSV23) vaccine. One dose is recommended after age 85. ?Talk to your health care provider about which screenings and vaccines you need and how often you need them. ?This information is not intended to replace advice given to you by your health care provider. Make sure you discuss any questions you have with your health care provider. ?Document Released: 07/05/2015 Document Revised: 02/26/2016 Document Reviewed: 04/09/2015 ?Elsevier Interactive Patient Education ? 2017 [place]. ? ?Fall Prevention in the Home ?Falls can cause injuries. They can happen to people of all ages. There are many things you can do to make your home safe and to help prevent falls. ?What can I do on the outside of my home? ?Regularly fix the edges of walkways and driveways and fix any cracks. ?Remove anything that might make you trip as you walk through a door, such as a raised step or threshold. ?Trim any bushes or trees on the path to your home. ?Use bright outdoor lighting. ?Clear any walking paths of anything that might make someone trip, such as rocks or tools. ?Regularly check to see if handrails are loose or broken. Make sure that both sides of any steps have handrails. ?Any raised decks and porches should have guardrails on the edges. ?Have any leaves, snow, or ice cleared regularly. ?Use sand or salt on walking paths during winter. ?Clean up any spills in your garage right away. This includes oil or grease spills. ?What can I do in the bathroom? ?Use night lights. ?Install grab bars by the toilet and in the tub and shower. Do not use towel bars as grab bars. ?Use non-skid mats or decals in  the tub or shower. ?If you need to sit down in the shower, use a plastic, non-slip stool. ?Keep the floor dry. Clean up any water that spills on the floor as soon as it happens. ?Remove soap buildup in the tub or shower regularly. ?Attach bath mats securely with double-sided non-slip rug tape. ?Do not have throw rugs and other things on the floor that can make you trip. ?What can I do in the bedroom? ?Use night lights. ?Make sure that you have a light by your bed that is easy to reach. ?Do not use any sheets or blankets that are too big for your bed. They should not hang down onto the floor. ?Have a firm chair that has side arms. You can use this for support while you get dressed. ?Do not have throw rugs and other things on the floor that can make you trip. ?What can I do in the kitchen? ?Clean up any spills right away. ?Avoid walking on wet floors. ?Keep items that you use a lot in easy-to-reach places. ?If you need to reach something above you, use a strong step stool that has a grab bar. ?Keep electrical cords out of the way. ?Do not use floor polish or wax that makes floors slippery. If you must use wax, use non-skid floor wax. ?Do not have throw rugs and other things on the floor that can make you trip. ?What can I  do with my stairs? ?Do not leave any items on the stairs. ?Make sure that there are handrails on both sides of the stairs and use them. Fix handrails that are broken or loose. Make sure that handrails are as long as the stairways. ?Check any carpeting to make sure that it is firmly attached to the stairs. Fix any carpet that is loose or worn. ?Avoid having throw rugs at the top or bottom of the stairs. If you do have throw rugs, attach them to the floor with carpet tape. ?Make sure that you have a light switch at the top of the stairs and the bottom of the stairs. If you do not have them, ask someone to add them for you. ?What else can I do to help prevent falls? ?Wear shoes that: ?Do not have high  heels. ?Have rubber bottoms. ?Are comfortable and fit you well. ?Are closed at the toe. Do not wear sandals. ?If you use a stepladder: ?Make sure that it is fully opened. Do not climb a closed stepladder. ?Priscilla Daniels

## 2021-10-03 ENCOUNTER — Ambulatory Visit (INDEPENDENT_AMBULATORY_CARE_PROVIDER_SITE_OTHER): Payer: Medicare Other

## 2021-10-03 VITALS — Wt 138.0 lb

## 2021-10-03 DIAGNOSIS — Z0000 Encounter for general adult medical examination without abnormal findings: Secondary | ICD-10-CM | POA: Diagnosis not present

## 2021-10-03 NOTE — Progress Notes (Signed)
? ?Subjective:  ? Priscilla Daniels is a 81 y.o. female who presents for Medicare Annual (Subsequent) preventive examination. ? ?Virtual Visit via Telephone Note ? ?I connected with  Priscilla Daniels on 10/03/21 at 10:30 AM EDT by telephone and verified that I am speaking with the correct person using two identifiers. ? ?Location: ?Patient: Home ?Provider: [facility] ?Persons participating in the virtual visit: patient/Nurse Health Advisor ?  ?I discussed the limitations, risks, security and privacy concerns of performing an evaluation and management service by telephone and the availability of in person appointments. The patient expressed understanding and agreed to proceed. ? ?Interactive audio and video telecommunications were attempted between this nurse and patient, however failed, due to patient having technical difficulties OR patient did not have access to video capability.  We continued and completed visit with audio only. ? ?Some vital signs may be absent or patient reported.  ? ?Emil Weigold Dionne Ano, LPN  ? ?Review of Systems    ? ?Cardiac Risk Factors include: advanced age (>8men, >43 women);sedentary lifestyle;hypertension;dyslipidemia ? ?   ?Objective:  ?  ?Today's Vitals  ? 10/03/21 1033  ?Weight: 138 lb (62.6 kg)  ? ?Body mass index is 21.61 kg/m?. ? ? ?  10/03/2021  ? 10:45 AM 01/22/2021  ?  3:09 PM 10/02/2020  ? 10:25 AM 10/02/2019  ? 10:06 AM 08/15/2018  ?  2:56 PM 02/08/2015  ? 11:02 AM  ?Advanced Directives  ?Does Patient Have a Medical Advance Directive? Yes No No Yes Yes No  ?Type of Paramedic of [place];Living will   Living will;Healthcare Power of Attorney Living will   ?Does patient want to make changes to medical advance directive?    No - Patient declined No - Patient declined   ?Copy of [place] in Chart? No - copy requested   No - copy requested    ?Would patient like information on creating a medical advance directive?   Yes (MAU/Ambulatory/Procedural Areas -  Information given)   Yes - Educational materials given  ? ? ?Current Medications (verified) ?Outpatient Encounter Medications as of 10/03/2021  ?Medication Sig  ? acetaminophen (TYLENOL) 500 MG tablet Take 500 mg by mouth every 6 (six) hours as needed.  ? aspirin 81 MG EC tablet Take 81 mg by mouth daily. Swallow whole.  ? b complex vitamins capsule Take 1 capsule by mouth daily.  ? Calcium Carb-Cholecalciferol (CALCIUM 600/VITAMIN D PO) Take by mouth. With 2000U vitamin d , 2 tablets daily  ? dorzolamide (TRUSOPT) 2 % ophthalmic solution 2 (two) times daily.  ? lisinopril-hydrochlorothiazide (ZESTORETIC) 20-12.5 MG tablet Take 1 tablet by mouth daily.  ? meloxicam (MOBIC) 15 MG tablet TAKE ONE (1) TABLET BY MOUTH EVERY DAY  ? mirabegron ER (MYRBETRIQ) 50 MG TB24 tablet Take 1 tablet (50 mg total) by mouth daily.  ? rosuvastatin (CRESTOR) 10 MG tablet Take 1 tablet (10 mg total) by mouth daily.  ? TRAVATAN Z 0.004 % SOLN ophthalmic solution   ? ?No facility-administered encounter medications on file as of 10/03/2021.  ? ? ?Allergies (verified) ?Lipitor [atorvastatin calcium], Septra [bactrim], and Sulfa antibiotics  ? ?History: ?Past Medical History:  ?Diagnosis Date  ? Abnormal vaginal Pap smear   ? Candidiasis   ? cuteneous   ? Chronic UTI   ? COPD (chronic obstructive pulmonary disease) ([place]) 2010  ? Dx by CXR   ? DM (diabetes mellitus) ([place])   ? Fatigue   ? Glaucoma   ? Hemorrhage  in optic nerve sheath of left eye 01/23/2020  ? Hyperlipidemia   ? Diet Controlled   ? NIDDM (non-insulin dependent diabetes mellitus)   ? Osteopenia 03/12/2021  ? Phlebitis   ? Strep pharyngitis   ? Vaginal atrophy   ? Vertigo   ? ?Past Surgical History:  ?Procedure Laterality Date  ? CERVICAL CONE BIOPSY    ? GLAUCOMA SURGERY  12/04 & 07/05/03  ? left eye cataract surgery  01/29/2009  ? right eye cataract surgery  01/13/09  ? VARICOSE VEIN SURGERY    ? both legs   ? ?Family History  ?Problem Relation Age of Onset  ? Stroke Mother   ?  Cancer Father   ? Cancer Sister   ? Diabetes Brother   ? Hyperlipidemia Brother   ? Hyperlipidemia Brother   ? ?Social History  ? ?Socioeconomic History  ? Marital status: Widowed  ?  Spouse name: Not on file  ? Number of children: 2  ? Years of education: 7  ? Highest education level: 7th grade  ?Occupational History  ? Occupation: Retired  ?Tobacco Use  ? Smoking status: Never  ? Smokeless tobacco: Never  ?Vaping Use  ? Vaping Use: Never used  ?Substance and Sexual Activity  ? Alcohol use: No  ? Drug use: No  ? Sexual activity: Not Currently  ?Other Topics Concern  ? Not on file  ?Social History Narrative  ? Her daughter lives with her; She has 2 dogs she takes care of.  ? ?Social Determinants of Health  ? ?Financial Resource Strain: Low Risk   ? Difficulty of Paying Living Expenses: Not hard at all  ?Food Insecurity: No Food Insecurity  ? Worried About Charity fundraiser in the Last Year: Never true  ? Ran Out of Food in the Last Year: Never true  ?Transportation Needs: No Transportation Needs  ? Lack of Transportation (Medical): No  ? Lack of Transportation (Non-Medical): No  ?Physical Activity: Insufficiently Active  ? Days of Exercise per Week: 7 days  ? Minutes of Exercise per Session: 10 min  ?Stress: No Stress Concern Present  ? Feeling of Stress : Not at all  ?Social Connections: Socially Isolated  ? Frequency of Communication with Friends and Family: More than three times a week  ? Frequency of Social Gatherings with Friends and Family: Once a week  ? Attends Religious Services: Never  ? Active Member of Clubs or Organizations: No  ? Attends Archivist Meetings: Never  ? Marital Status: Widowed  ? ? ?Tobacco Counseling ?Counseling given: Not Answered ? ? ?Clinical Intake: ? ?Pre-visit preparation completed: Yes ? ?Pain : No/denies pain ? ?  ? ?BMI - recorded: 21.61 ?Nutritional Status: BMI of 19-24  Normal ?Nutritional Risks: None ?Diabetes: No ? ?How often do you need to have someone help you  when you read instructions, pamphlets, or other written materials from your doctor or pharmacy?: 1 - Never ? ?Diabetic? no ? ?Interpreter Needed?: No ? ?Information entered by :: Mallarie Voorhies, LPN ? ? ?Activities of Daily Living ? ?  10/03/2021  ? 10:36 AM  ?In your present state of health, do you have any difficulty performing the following activities:  ?Hearing? 1  ?Comment wears hearing aids  ?Vision? 0  ?Difficulty concentrating or making decisions? 1  ?Walking or climbing stairs? 1  ?Dressing or bathing? 0  ?Doing errands, shopping? 0  ?Preparing Food and eating ? Y  ?Using the Toilet? N  ?In the  past six months, have you accidently leaked urine? Y  ?Do you have problems with loss of bowel control? N  ?Managing your Medications? Y  ?Managing your Finances? Y  ?Housekeeping or managing your Housekeeping? Y  ? ? ?Patient Care Team: ?Loman Brooklyn, FNP as PCP - General (Family Medicine) ?Rankin, Clent Demark, MD as Consulting Physician (Ophthalmology) ?Okey Regal, OD (Optometry) ? ?Indicate any recent Medical Services you may have received from other than [facility] providers in the past year (date may be approximate). ? ?   ?Assessment:  ? This is a routine wellness examination for Priscilla Daniels. ? ?Hearing/Vision screen ?Hearing Screening - Comments:: Wears hearing aids - [facility] ?Vision Screening - Comments:: Wears rx glasses - up to date with routine eye exams with [facility] in [place] and [facility] in [place] ? ?Dietary issues and exercise activities discussed: ?Current Exercise Habits: Home exercise routine, Type of exercise: walking;stretching, Time (Minutes): 10, Frequency (Times/Week): 7, Weekly Exercise (Minutes/Week): 70, Intensity: Mild, Exercise limited by: orthopedic condition(s);cardiac condition(s) ? ? Goals Addressed   ? ?  ?  ?  ?  ? This Visit's Progress  ?  Exercise 150 min/wk Moderate Activity   Not on track  ?  10/02/2019 ?AWV Goal: Exercise for General Health ? ?Patient will verbalize understanding  of the benefits of increased physical activity: ?Exercising regularly is important. It will improve your overall fitness, flexibility, and endurance. ?Regular exercise also will improve your overall heal

## 2021-10-17 ENCOUNTER — Encounter: Payer: Self-pay | Admitting: Family

## 2021-10-17 ENCOUNTER — Ambulatory Visit (INDEPENDENT_AMBULATORY_CARE_PROVIDER_SITE_OTHER): Payer: Medicare Other | Admitting: Family

## 2021-10-17 DIAGNOSIS — R399 Unspecified symptoms and signs involving the genitourinary system: Secondary | ICD-10-CM | POA: Diagnosis not present

## 2021-10-17 MED ORDER — CEPHALEXIN 500 MG PO CAPS: 500.0000 mg | ORAL_CAPSULE | Freq: Two times a day (BID) | ORAL | 0 refills | Status: DC

## 2021-10-17 NOTE — Progress Notes (Signed)
? ?Virtual Visit  Note ?Due to COVID-19 pandemic this visit was conducted virtually. This visit type was conducted due to national recommendations for restrictions regarding the COVID-19 Pandemic (e.g. social distancing, sheltering in place) in an effort to limit this patient's exposure and mitigate transmission in our community. All issues noted in this document were discussed and addressed.  A physical exam was not performed with this format. ? ?I connected with Priscilla Daniels on 10/17/21 at 4:24 pm  by telephone and verified that I am speaking with the correct person using two identifiers. Priscilla Daniels is currently located at home  and no one is currently with her during visit. The provider, Jannifer Rodney, FNP is located in their office at time of visit. ? ?I discussed the limitations, risks, security and privacy concerns of performing an evaluation and management service by telephone and the availability of in person appointments. I also discussed with the patient that there may be a patient responsible charge related to this service. The patient expressed understanding and agreed to proceed. ? ?[name],you are scheduled for a virtual visit with your provider today.   ? ?Just as we do with appointments in the office, we must obtain your consent to participate.  Your consent will be active for this visit and any virtual visit you may have with one of our providers in the next 365 days.   ? ?If you have a MyChart account, I can also send a copy of this consent to you electronically.  All virtual visits are billed to your insurance company just like a traditional visit in the office.  As this is a virtual visit, video technology does not allow for your provider to perform a traditional examination.  This may limit your provider's ability to fully assess your condition.  If your provider identifies any concerns that need to be evaluated in person or the need to arrange testing such as labs, EKG, etc, we  will make arrangements to do so.   ? ?Although advances in technology are sophisticated, we cannot ensure that it will always work on either your end or our end.  If the connection with a video visit is poor, we may have to switch to a telephone visit.  With either a video or telephone visit, we are not always able to ensure that we have a secure connection.   I need to obtain your verbal consent now.   Are you willing to proceed with your visit today?  ? ?Priscilla Daniels has provided verbal consent on 10/17/2021 for a virtual visit (video or telephone). ? ? ?Jannifer Rodney, FNP ?10/17/2021  4:25 PM ? ? ? ?History and Present Illness: ? ?Urinary Frequency  ?This is a new problem. The current episode started in the past 7 days. The problem occurs intermittently. The problem has been waxing and waning. The quality of the pain is described as aching. The pain is mild. Associated symptoms include flank pain and frequency. Pertinent negatives include no hematuria, nausea, urgency or vomiting. She has tried increased fluids for the symptoms. The treatment provided mild relief.  ? ? ? ?Review of Systems  ?Gastrointestinal:  Negative for nausea and vomiting.  ?Genitourinary:  Positive for flank pain and frequency. Negative for hematuria and urgency.  ? ? ?Observations/Objective: ?No SOB or distress noted  ? ?Assessment and Plan: ?1. UTI symptoms ?Force fluids ?AZO over the counter X2 days ?RTO if symptoms worsen or do not improve  ?- cephALEXin (KEFLEX) 500  MG capsule; Take 1 capsule (500 mg total) by mouth 2 (two) times daily.  Dispense: 14 capsule; Refill: 0 ? ? ? ?I discussed the assessment and treatment plan with the patient. The patient was provided an opportunity to ask questions and all were answered. The patient agreed with the plan and demonstrated an understanding of the instructions. ?  ?The patient was advised to call back or seek an in-person evaluation if the symptoms worsen or if the condition fails to improve  as anticipated. ? ?The above assessment and management plan was discussed with the patient. The patient verbalized understanding of and has agreed to the management plan. Patient is aware to call the clinic if symptoms persist or worsen. Patient is aware when to return to the clinic for a follow-up visit. Patient educated on when it is appropriate to go to the emergency department.  ? ?Time call ended:  4:35 pm  ? ?I provided 11  minutes of  non face-to-face time during this encounter. ? ? ? ?Jannifer Rodney, FNP ? ? ?

## 2021-12-04 ENCOUNTER — Other Ambulatory Visit: Payer: Self-pay | Admitting: Family Medicine

## 2021-12-04 DIAGNOSIS — N3941 Urge incontinence: Secondary | ICD-10-CM

## 2021-12-18 ENCOUNTER — Ambulatory Visit (INDEPENDENT_AMBULATORY_CARE_PROVIDER_SITE_OTHER): Payer: Medicare Other | Admitting: Family Medicine

## 2021-12-18 ENCOUNTER — Encounter: Payer: Self-pay | Admitting: Family Medicine

## 2021-12-18 DIAGNOSIS — N3001 Acute cystitis with hematuria: Secondary | ICD-10-CM | POA: Diagnosis not present

## 2021-12-18 DIAGNOSIS — R300 Dysuria: Secondary | ICD-10-CM | POA: Diagnosis not present

## 2021-12-18 LAB — MICROSCOPIC EXAMINATION
Epithelial Cells (non renal): NONE SEEN /hpf (ref 0–10)
Renal Epithel, UA: NONE SEEN /hpf
WBC, UA: >30 /hpf — AB (ref 0–5)

## 2021-12-18 LAB — URINALYSIS, ROUTINE W REFLEX MICROSCOPIC
Bilirubin, UA: NEGATIVE
Glucose, UA: NEGATIVE
Ketones, UA: NEGATIVE
Nitrite, UA: NEGATIVE
Specific Gravity, UA: 1.015 (ref 1.005–1.030)
Urobilinogen, Ur: 0.2 mg/dL (ref 0.2–1.0)
pH, UA: 7 (ref 5.0–7.5)

## 2021-12-18 MED ORDER — AMOXICILLIN-POT CLAVULANATE 875-125 MG PO TABS: 1.0000 | ORAL_TABLET | Freq: Two times a day (BID) | ORAL | 0 refills | Status: DC

## 2021-12-18 NOTE — Progress Notes (Signed)
Virtual Visit via telephone Note Due to COVID-19 pandemic this visit was conducted virtually. This visit type was conducted due to national recommendations for restrictions regarding the COVID-19 Pandemic (e.g. social distancing, sheltering in place) in an effort to limit this patient's exposure and mitigate transmission in our community. All issues noted in this document were discussed and addressed.  A physical exam was not performed with this format.   I connected with Priscilla Daniels on 12/18/2021 at  by [phone] and verified that I am speaking with the correct person using two identifiers. Anyae ADRIEANNA Daniels is currently located at home and family is currently with them during visit. The provider, Monia Pouch, FNP is located in their office at time of visit.  I discussed the limitations, risks, security and privacy concerns of performing an evaluation and management service by virtual visit and the availability of in person appointments. I also discussed with the patient that there may be a patient responsible charge related to this service. The patient expressed understanding and agreed to proceed.  Subjective:  Patient ID: Priscilla Daniels, female    DOB: [birth year], 81 y.o.   MRN: [id number]  Chief Complaint:  Dysuria   HPI: [name] is a 81 y.o. female presenting on 12/18/2021 for Dysuria   Dysuria  This is a new problem. The current episode started in the past 7 days. The problem occurs every urination. The problem has been gradually worsening. The quality of the pain is described as burning and aching. The pain is mild. There has been no fever. She is Not sexually active. There is No history of pyelonephritis. Associated symptoms include frequency and urgency. Pertinent negatives include no chills, discharge, flank pain, hematuria, hesitancy, nausea, possible pregnancy, sweats or vomiting. She has tried increased fluids for the symptoms. The treatment provided no relief.      Relevant past medical, surgical, family, and social history reviewed and updated as indicated.  Allergies and medications reviewed and updated.   Past Medical History:  Diagnosis Date   Abnormal vaginal Pap smear    Candidiasis    cuteneous    Chronic UTI    COPD (chronic obstructive pulmonary disease) ([place]) 2010   Dx by CXR    DM (diabetes mellitus) ([place])    Fatigue    Glaucoma    Hemorrhage in optic nerve sheath of left eye 01/23/2020   Hyperlipidemia    Diet Controlled    NIDDM (non-insulin dependent diabetes mellitus)    Osteopenia 03/12/2021   Phlebitis    Strep pharyngitis    Vaginal atrophy    Vertigo     Past Surgical History:  Procedure Laterality Date   CERVICAL CONE BIOPSY     GLAUCOMA SURGERY  12/04 & 07/05/03   left eye cataract surgery  01/29/2009   right eye cataract surgery  01/13/09   VARICOSE VEIN SURGERY     both legs     Social History   Socioeconomic History   Marital status: Widowed    Spouse name: Not on file   Number of children: 2   Years of education: 7   Highest education level: 7th grade  Occupational History   Occupation: Retired  Tobacco Use   Smoking status: Never   Smokeless tobacco: Never  Vaping Use   Vaping Use: Never used  Substance and Sexual Activity   Alcohol use: No   Drug use: No   Sexual activity: Not Currently  Other Topics Concern   Not  on file  Social History Narrative   Her daughter lives with her; She has 2 dogs she takes care of.   Social Determinants of Health   Financial Resource Strain: Low Risk  (10/03/2021)   Overall Financial Resource Strain (CARDIA)    Difficulty of Paying Living Expenses: Not hard at all  Food Insecurity: No Food Insecurity (10/03/2021)   Hunger Vital Sign    Worried About Running Out of Food in the Last Year: Never true    Ran Out of Food in the Last Year: Never true  Transportation Needs: No Transportation Needs (10/03/2021)   PRAPARE - Scientist, research (physical sciences) (Medical): No    Lack of Transportation (Non-Medical): No  Physical Activity: Insufficiently Active (10/03/2021)   Exercise Vital Sign    Days of Exercise per Week: 7 days    Minutes of Exercise per Session: 10 min  Stress: No Stress Concern Present (10/03/2021)   [employer] of Occupational Health - Occupational Stress Questionnaire    Feeling of Stress : Not at all  Social Connections: Socially Isolated (10/03/2021)   Social Connection and Isolation Panel [NHANES]    Frequency of Communication with Friends and Family: More than three times a week    Frequency of Social Gatherings with Friends and Family: Once a week    Attends Religious Services: Never    Database administrator or Organizations: No    Attends Banker Meetings: Never    Marital Status: Widowed  Intimate Partner Violence: Not At Risk (10/03/2021)   Humiliation, Afraid, Rape, and Kick questionnaire    Fear of Current or Ex-Partner: No    Emotionally Abused: No    Physically Abused: No    Sexually Abused: No    Outpatient Encounter Medications as of 12/18/2021  Medication Sig   amoxicillin-clavulanate (AUGMENTIN) 875-125 MG tablet Take 1 tablet by mouth 2 (two) times daily for 7 days.   acetaminophen (TYLENOL) 500 MG tablet Take 500 mg by mouth every 6 (six) hours as needed.   aspirin 81 MG EC tablet Take 81 mg by mouth daily. Swallow whole.   b complex vitamins capsule Take 1 capsule by mouth daily.   Calcium Carb-Cholecalciferol (CALCIUM 600/VITAMIN D PO) Take by mouth. With 2000U vitamin d , 2 tablets daily   dorzolamide (TRUSOPT) 2 % ophthalmic solution 2 (two) times daily.   lisinopril-hydrochlorothiazide (ZESTORETIC) 20-12.5 MG tablet Take 1 tablet by mouth daily.   meloxicam (MOBIC) 15 MG tablet TAKE ONE (1) TABLET BY MOUTH EVERY DAY   MYRBETRIQ 50 MG TB24 tablet TAKE ONE (1) TABLET BY MOUTH EVERY DAY   rosuvastatin (CRESTOR) 10 MG tablet Take 1 tablet (10 mg total) by mouth  daily.   TRAVATAN Z 0.004 % SOLN ophthalmic solution    [DISCONTINUED] cephALEXin (KEFLEX) 500 MG capsule Take 1 capsule (500 mg total) by mouth 2 (two) times daily.   No facility-administered encounter medications on file as of 12/18/2021.    Allergies  Allergen Reactions   Lipitor [Atorvastatin Calcium]    Septra [Bactrim]    Sulfa Antibiotics     Review of Systems  Constitutional:  Negative for activity change, appetite change, chills, diaphoresis, fatigue, fever and unexpected weight change.  Gastrointestinal:  Negative for abdominal pain, nausea and vomiting.  Genitourinary:  Positive for dysuria, frequency and urgency. Negative for decreased urine volume, difficulty urinating, enuresis, flank pain, genital sores, hematuria, hesitancy, pelvic pain, vaginal bleeding, vaginal discharge and vaginal pain.  Musculoskeletal:  Negative for back pain.  Neurological:  Negative for weakness.  Psychiatric/Behavioral:  Negative for confusion.   All other systems reviewed and are negative.        Observations/Objective: No vital signs or physical exam, this was a virtual health encounter.  Pt alert and oriented, answers all questions appropriately, and able to speak in full sentences.    Assessment and Plan: Jeidi was seen today for dysuria.  Diagnoses and all orders for this visit:  Dysuria Urinalysis with 3+ leukocytes, 1+ RBC and many bacteria. Negative for nitrites. Culture pending.  -     Urinalysis, Routine w reflex microscopic -     Urine Culture -     Microscopic Examination  Acute cystitis with hematuria No reported symptoms concerning for acute pyelonephritis. Previous urine cultures reviewed and antibiotic selection based off of those results. Culture pending, will change regimen if warranted. Symptomatic care along with red flags discussed in detail. Report new, worsening, or persistent symptoms.  -     amoxicillin-clavulanate (AUGMENTIN) 875-125 MG tablet; Take 1  tablet by mouth 2 (two) times daily for 7 days.     Follow Up Instructions: Return if symptoms worsen or fail to improve.    I discussed the assessment and treatment plan with the patient. The patient was provided an opportunity to ask questions and all were answered. The patient agreed with the plan and demonstrated an understanding of the instructions.   The patient was advised to call back or seek an in-person evaluation if the symptoms worsen or if the condition fails to improve as anticipated.  The above assessment and management plan was discussed with the patient. The patient verbalized understanding of and has agreed to the management plan. Patient is aware to call the clinic if they develop any new symptoms or if symptoms persist or worsen. Patient is aware when to return to the clinic for a follow-up visit. Patient educated on when it is appropriate to go to the emergency department.    I provided 15 minutes of time during this telephone encounter.   Kari Baars, FNP-C [facility] [address] [place], [place] [address] [phone] 12/18/2021

## 2021-12-21 LAB — URINE CULTURE

## 2021-12-22 MED ORDER — CIPROFLOXACIN HCL 500 MG PO TABS: 500.0000 mg | ORAL_TABLET | Freq: Two times a day (BID) | ORAL | 0 refills | Status: AC

## 2021-12-22 NOTE — Addendum Note (Signed)
Addended by: Sonny Masters on: 12/22/2021 10:51 AM   Modules accepted: Orders

## 2021-12-25 ENCOUNTER — Encounter: Payer: Medicare Other | Admitting: Family Medicine

## 2021-12-25 ENCOUNTER — Encounter: Payer: Self-pay | Admitting: Family Medicine

## 2021-12-25 ENCOUNTER — Ambulatory Visit (INDEPENDENT_AMBULATORY_CARE_PROVIDER_SITE_OTHER): Payer: Medicare Other | Admitting: Family Medicine

## 2021-12-25 VITALS — BP 132/79 | HR 61 | Temp 97.2°F

## 2021-12-25 DIAGNOSIS — E559 Vitamin D deficiency, unspecified: Secondary | ICD-10-CM

## 2021-12-25 DIAGNOSIS — N1831 Chronic kidney disease, stage 3a: Secondary | ICD-10-CM

## 2021-12-25 DIAGNOSIS — M1990 Unspecified osteoarthritis, unspecified site: Secondary | ICD-10-CM

## 2021-12-25 DIAGNOSIS — E785 Hyperlipidemia, unspecified: Secondary | ICD-10-CM | POA: Diagnosis not present

## 2021-12-25 DIAGNOSIS — Z7189 Other specified counseling: Secondary | ICD-10-CM | POA: Diagnosis not present

## 2021-12-25 DIAGNOSIS — I10 Essential (primary) hypertension: Secondary | ICD-10-CM

## 2021-12-25 DIAGNOSIS — Z0001 Encounter for general adult medical examination with abnormal findings: Secondary | ICD-10-CM | POA: Diagnosis not present

## 2021-12-25 DIAGNOSIS — R7303 Prediabetes: Secondary | ICD-10-CM | POA: Diagnosis not present

## 2021-12-25 DIAGNOSIS — I700 Atherosclerosis of aorta: Secondary | ICD-10-CM | POA: Diagnosis not present

## 2021-12-25 DIAGNOSIS — N183 Chronic kidney disease, stage 3 unspecified: Secondary | ICD-10-CM | POA: Diagnosis not present

## 2021-12-25 DIAGNOSIS — N3941 Urge incontinence: Secondary | ICD-10-CM

## 2021-12-25 DIAGNOSIS — K5904 Chronic idiopathic constipation: Secondary | ICD-10-CM | POA: Diagnosis not present

## 2021-12-25 DIAGNOSIS — Z0000 Encounter for general adult medical examination without abnormal findings: Secondary | ICD-10-CM | POA: Diagnosis not present

## 2021-12-25 DIAGNOSIS — M8589 Other specified disorders of bone density and structure, multiple sites: Secondary | ICD-10-CM | POA: Diagnosis not present

## 2021-12-25 LAB — BAYER DCA HB A1C WAIVED: HB A1C (BAYER DCA - WAIVED): 5.8 % — ABNORMAL HIGH (ref 4.8–5.6)

## 2021-12-25 MED ORDER — MIRABEGRON ER 50 MG PO TB24
ORAL_TABLET | ORAL | 1 refills | Status: DC
Start: 2021-12-25 — End: 2022-07-01

## 2021-12-25 NOTE — Patient Instructions (Signed)
Miralax 1 capful in 6-8 oz beverage of choice once daily as needed.   

## 2021-12-25 NOTE — Progress Notes (Signed)
Assessment & Plan:  Well adult exam Discussed health benefits of physical activity, and encouraged her to engage in regular exercise appropriate for her age and condition. Preventive health education provided. Declined Shingrix, COVID booster, TDaP, and Prevnar 20.  Immunization History  Administered Date(s) Administered   Fluad Quad(high Dose 65+) 03/15/2019, 04/02/2020, 03/12/2021   Influenza, High Dose Seasonal PF 04/17/2016, 03/31/2018   Influenza,inj,Quad PF,6+ Mos 03/27/2013, 04/05/2014, 04/19/2015, 03/22/2017   Moderna Sars-Covid-2 Vaccination 09/13/2019, 10/11/2019, 05/01/2020   Health Maintenance  Topic Date Due   COVID-19 Vaccine (4 - Booster for Moderna series) 01/10/2022 (Originally 06/26/2020)   Zoster Vaccines- Shingrix (1 of 2) 03/27/2022 (Originally 10/26/1959)   Pneumonia Vaccine 22+ Years old (1 - PCV) 05/21/2022 (Originally 10/25/2005)   TETANUS/TDAP  08/21/2022 (Originally 10/20/2013)   INFLUENZA VACCINE  01/20/2022   DEXA SCAN  03/12/2024   HPV VACCINES  Aged Out   - CBC with Differential/Platelet - CMP14+EGFR - Lipid panel  2. ACP (advance care planning) Education and forms provided to patient. Requested she bring [place] a copy upon completion.  3. Atherosclerosis of aorta (HCC) Continue rosuvastatin and aspirin. - Lipid panel  4. Hyperlipidemia with target LDL less than 100 Well controlled on current regimen.  - CBC with Differential/Platelet - CMP14+EGFR - Lipid panel  5. Essential hypertension Well controlled on current regimen.  - CBC with Differential/Platelet - CMP14+EGFR - Lipid panel  6. Prediabetes Diet controlled. - Bayer DCA Hb A1c Waived  7. Stage 3a chronic kidney disease (HCC) Stable. - CMP14+EGFR  8. Arthritis Controlled with Tylenol. - CMP14+EGFR  9. Urge incontinence of urine - mirabegron ER (MYRBETRIQ) 50 MG TB24 tablet; TAKE ONE (1) TABLET BY MOUTH EVERY DAY  Dispense: 90 tablet; Refill: 1 - CMP14+EGFR  10. Osteopenia of  multiple sites Continue current regimen.  11. Vitamin D insufficiency Continue current regimen.   12. Chronic idiopathic constipation Miralax 1 capful in 6-8 oz beverage of choice once daily as needed.    Follow-up: Return in about 6 months (around 06/27/2022) for follow-up of chronic medication conditions.   Hendricks Limes, MSN, APRN, FNP-C [facility]  Subjective:  Patient ID: Priscilla Daniels, female    DOB: [birth year]  Age: 81 y.o. MRN: [id number]  Patient Care Team: Loman Brooklyn, FNP as PCP - General (Family Medicine) Zadie Rhine Clent Demark, MD as Consulting Physician (Ophthalmology) Okey Regal, OD (Optometry)   CC:  Chief Complaint  Patient presents with   Annual Exam    HPI Priscilla Daniels is a 81 y.o. female who presents today for a complete physical exam. She is accompanied by her daughter, who she is okay with being present. She reports consuming a general diet.  She is doing her physical therapy exercises  She generally feels well. She reports sleeping well. She does not have additional problems to discuss today.   She reports her audiologist advised a month ago she get her left ear washed out with her PCP.   Vision:Within last year Dental:No regular dental care. Dentures.  Advanced Directives Patient does not have advanced directives including DNR, living will, healthcare power of attorney, financial power of attorney, and MOST form.   Depression Screening: patient reports she occasionally feels she has some depression but that it doesn't bother her all the time and she does not want medication to treat. She does spend a lot of time at home alone while her daughter is at work, but this will change when her daughter retired in December.  12/25/2021   11:16 AM 10/03/2021   10:35 AM 09/24/2021    1:29 PM  Depression screen PHQ 2/9  Decreased Interest 3 3 3   Down, Depressed, Hopeless 0 3 3  PHQ - 2 Score 3 6 6   Altered sleeping 0 2 2   Tired, decreased energy 3 3 3   Change in appetite 0 1 1  Feeling bad or failure about yourself  0 1 1  Trouble concentrating 3 3 3   Moving slowly or fidgety/restless 2 0 0  Suicidal thoughts 0 0 0  PHQ-9 Score 11 16 16   Difficult doing work/chores Somewhat difficult Very difficult Extremely dIfficult      12/25/2021   11:17 AM 09/24/2021    1:30 PM 08/20/2021   10:31 AM 03/12/2021    9:46 AM  GAD 7 : Generalized Anxiety Score  Nervous, Anxious, on Edge 1 2 1 1   Control/stop worrying 3 3 3 3   Worry too much - different things 1 3 3 3   Trouble relaxing 0 1 1 1   Restless 0 0 0 1  Easily annoyed or irritable 1 3 2 2   Afraid - awful might happen 0 3 2 3   Total GAD 7 Score 6 15 12 14   Anxiety Difficulty Not difficult at all Somewhat difficult Very difficult Very difficult    Hypertension: She has previously been well controlled on lisinopril 56m-HCTZ 12.5 mg. She does not check her BP at home, but does have a cuff to do so.   Atherosclerosis of aorta/Hyperlipidemia: taking Rosuvastatin 10 mg and aspirin daily.    CKD: Stage IIIa.    Prediabetes: Last A1c 5.7 in September 2022.   Vitamin D insufficiency: Resolved with OTC vitamin D supplement.   Osteopenia: Patient is taking a calcium and vitamin D supplement.  Her last DEXA scan was completed on 03/12/2021.   Urge Incontinence of Urine: reports she is doing better with Myrbetriq and can definitely tell a difference, but still has some accidents.    Review of Systems  Constitutional:  Negative for chills, fever, malaise/fatigue and weight loss.  HENT:  Positive for hearing loss (wears hearing aids). Negative for congestion, ear discharge, ear pain, nosebleeds, sinus pain, sore throat and tinnitus.   Eyes:  Negative for blurred vision, double vision, pain, discharge and redness.  Respiratory:  Negative for cough, shortness of breath and wheezing.   Cardiovascular:  Negative for chest pain, palpitations and leg swelling.   Gastrointestinal:  Positive for constipation. Negative for abdominal pain, diarrhea, heartburn, nausea and vomiting.  Genitourinary:  Negative for dysuria, frequency and urgency.  Musculoskeletal:  Positive for back pain and joint pain. Negative for myalgias.  Skin:  Negative for rash.  Neurological:  Negative for dizziness, seizures, weakness and headaches.  Psychiatric/Behavioral:  Negative for depression, substance abuse and suicidal ideas. The patient is not nervous/anxious.     Current Outpatient Medications:    acetaminophen (TYLENOL) 500 MG tablet, Take 500 mg by mouth every 6 (six) hours as needed., Disp: , Rfl:    aspirin 81 MG EC tablet, Take 81 mg by mouth daily. Swallow whole., Disp: , Rfl:    Calcium Carb-Cholecalciferol (CALCIUM 600/VITAMIN D PO), Take by mouth. With 2000U vitamin d , 2 tablets daily, Disp: , Rfl:    ciprofloxacin (CIPRO) 500 MG tablet, Take 1 tablet (500 mg total) by mouth 2 (two) times daily for 5 days., Disp: 10 tablet, Rfl: 0   dorzolamide (TRUSOPT) 2 % ophthalmic solution, 2 (two) times daily., Disp: ,  Rfl:    lisinopril-hydrochlorothiazide (ZESTORETIC) 20-12.5 MG tablet, Take 1 tablet by mouth daily., Disp: 90 tablet, Rfl: 1   rosuvastatin (CRESTOR) 10 MG tablet, Take 1 tablet (10 mg total) by mouth daily., Disp: 90 tablet, Rfl: 1   TRAVATAN Z 0.004 % SOLN ophthalmic solution, , Disp: , Rfl:    mirabegron ER (MYRBETRIQ) 50 MG TB24 tablet, TAKE ONE (1) TABLET BY MOUTH EVERY DAY, Disp: 90 tablet, Rfl: 1  Allergies  Allergen Reactions   Lipitor [Atorvastatin Calcium]    Septra [Bactrim]    Sulfa Antibiotics     Past Medical History:  Diagnosis Date   Abnormal vaginal Pap smear    Candidiasis    cuteneous    Chronic UTI    COPD (chronic obstructive pulmonary disease) ([place]) 2010   Dx by CXR    DM (diabetes mellitus) ([place])    Fatigue    Glaucoma    Hemorrhage in optic nerve sheath of left eye 01/23/2020   Hyperlipidemia    Diet Controlled     NIDDM (non-insulin dependent diabetes mellitus)    Osteopenia 03/12/2021   Phlebitis    Strep pharyngitis    Vaginal atrophy    Vertigo     Past Surgical History:  Procedure Laterality Date   CERVICAL CONE BIOPSY     GLAUCOMA SURGERY  12/04 & 07/05/03   left eye cataract surgery  01/29/2009   right eye cataract surgery  01/13/09   VARICOSE VEIN SURGERY     both legs     Family History  Problem Relation Age of Onset   Stroke Mother    Cancer Father    Cancer Sister    Diabetes Brother    Hyperlipidemia Brother    Hyperlipidemia Brother     Social History   Socioeconomic History   Marital status: Widowed    Spouse name: Not on file   Number of children: 2   Years of education: 7   Highest education level: 7th grade  Occupational History   Occupation: Retired  Tobacco Use   Smoking status: Never   Smokeless tobacco: Never  Vaping Use   Vaping Use: Never used  Substance and Sexual Activity   Alcohol use: No   Drug use: No   Sexual activity: Not Currently  Other Topics Concern   Not on file  Social History Narrative   Her daughter lives with her; She has 2 dogs she takes care of.   Social Determinants of Health   Financial Resource Strain: Low Risk  (10/03/2021)   Overall Financial Resource Strain (CARDIA)    Difficulty of Paying Living Expenses: Not hard at all  Food Insecurity: No Food Insecurity (10/03/2021)   Hunger Vital Sign    Worried About Running Out of Food in the Last Year: Never true    Ran Out of Food in the Last Year: Never true  Transportation Needs: No Transportation Needs (10/03/2021)   PRAPARE - Hydrologist (Medical): No    Lack of Transportation (Non-Medical): No  Physical Activity: Insufficiently Active (10/03/2021)   Exercise Vital Sign    Days of Exercise per Week: 7 days    Minutes of Exercise per Session: 10 min  Stress: No Stress Concern Present (10/03/2021)   [place]    Feeling of Stress : Not at all  Social Connections: Socially Isolated (10/03/2021)   Social Connection and Isolation Panel [NHANES]  Frequency of Communication with Friends and Family: More than three times a week    Frequency of Social Gatherings with Friends and Family: Once a week    Attends Religious Services: Never    Marine scientist or Organizations: No    Attends Archivist Meetings: Never    Marital Status: Widowed  Intimate Partner Violence: Not At Risk (10/03/2021)   Humiliation, Afraid, Rape, and Kick questionnaire    Fear of Current or Ex-Partner: No    Emotionally Abused: No    Physically Abused: No    Sexually Abused: No      Objective:    BP 132/79   Pulse 61   Temp (!) 97.2 F (36.2 C) (Temporal)   SpO2 96%   BP Readings from Last 3 Encounters:  12/25/21 132/79  09/24/21 121/78  08/20/21 (!) 166/87   Wt Readings from Last 3 Encounters:  10/03/21 138 lb (62.6 kg)  02/05/21 142 lb (64.4 kg)  12/04/20 146 lb 6 oz (66.4 kg)    Physical Exam Vitals reviewed.  Constitutional:      General: She is not in acute distress.    Appearance: Normal appearance. She is not ill-appearing, toxic-appearing or diaphoretic.  HENT:     Head: Normocephalic and atraumatic.     Right Ear: Tympanic membrane, ear canal and external ear normal. There is no impacted cerumen.     Left Ear: Tympanic membrane, ear canal and external ear normal. There is no impacted cerumen.     Ears:     Comments: Left ear only had a flaking piece of skin that needed to be removed.    Nose: Nose normal. No congestion or rhinorrhea.     Mouth/Throat:     Mouth: Mucous membranes are moist.     Pharynx: Oropharynx is clear. No oropharyngeal exudate or posterior oropharyngeal erythema.  Eyes:     General: No scleral icterus.       Right eye: No discharge.        Left eye: No discharge.     Conjunctiva/sclera: Conjunctivae normal.     Pupils:  Pupils are equal, round, and reactive to light.  Cardiovascular:     Rate and Rhythm: Normal rate and regular rhythm.     Heart sounds: Normal heart sounds. No murmur heard.    No friction rub. No gallop.  Pulmonary:     Effort: Pulmonary effort is normal. No respiratory distress.     Breath sounds: Normal breath sounds. No stridor. No wheezing, rhonchi or rales.  Abdominal:     General: Abdomen is flat. Bowel sounds are normal. There is no distension.     Palpations: Abdomen is soft. There is no hepatomegaly, splenomegaly or mass.     Tenderness: There is no abdominal tenderness. There is no guarding or rebound.     Hernia: No hernia is present.  Musculoskeletal:        General: Normal range of motion.     Cervical back: Normal range of motion and neck supple. No rigidity. No muscular tenderness.     Comments: Varicose veins bilaterally.  Lymphadenopathy:     Cervical: No cervical adenopathy.  Skin:    General: Skin is warm and dry.     Capillary Refill: Capillary refill takes less than 2 seconds.  Neurological:     General: No focal deficit present.     Mental Status: She is alert and oriented to person, place, and time. Mental status is at  baseline.     Motor: Weakness: riding in wheelchair currently.     Gait: Gait abnormal (riding in [facility]).  Psychiatric:        Mood and Affect: Mood normal.        Behavior: Behavior normal.        Thought Content: Thought content normal.        Judgment: Judgment normal.    Lab Results  Component Value Date   TSH 2.290 09/12/2020   Lab Results  Component Value Date   WBC 7.0 08/20/2021   HGB 12.0 08/20/2021   HCT 35.6 08/20/2021   MCV 85 08/20/2021   PLT 245 08/20/2021   Lab Results  Component Value Date   NA 138 08/20/2021   K 4.2 08/20/2021   CO2 25 08/20/2021   GLUCOSE 111 (H) 08/20/2021   BUN 24 08/20/2021   CREATININE 0.96 08/20/2021   BILITOT 0.4 08/20/2021   ALKPHOS 153 (H) 08/20/2021   AST 45 (H) 08/20/2021   ALT 77  (H) 08/20/2021   PROT 6.5 08/20/2021   ALBUMIN 4.4 08/20/2021   CALCIUM 10.1 08/20/2021   EGFR 60 08/20/2021   Lab Results  Component Value Date   CHOL 145 08/20/2021   Lab Results  Component Value Date   HDL 78 08/20/2021   Lab Results  Component Value Date   LDLCALC 51 08/20/2021   Lab Results  Component Value Date   TRIG 88 08/20/2021   Lab Results  Component Value Date   CHOLHDL 1.9 08/20/2021   Lab Results  Component Value Date   HGBA1C 5.8 (H) 08/20/2021

## 2021-12-26 LAB — CBC WITH DIFFERENTIAL/PLATELET
Basophils Absolute: 0.1 10*3/uL (ref 0.0–0.2)
Basos: 1 %
EOS (ABSOLUTE): 0.1 10*3/uL (ref 0.0–0.4)
Eos: 2 %
Hematocrit: 37.2 % (ref 34.0–46.6)
Hemoglobin: 12 g/dL (ref 11.1–15.9)
Immature Grans (Abs): 0 10*3/uL (ref 0.0–0.1)
Immature Granulocytes: 0 %
Lymphocytes Absolute: 1.6 10*3/uL (ref 0.7–3.1)
Lymphs: 30 %
MCH: 28 pg (ref 26.6–33.0)
MCHC: 32.3 g/dL (ref 31.5–35.7)
MCV: 87 fL (ref 79–97)
Monocytes Absolute: 0.5 10*3/uL (ref 0.1–0.9)
Monocytes: 9 %
Neutrophils Absolute: 3.1 10*3/uL (ref 1.4–7.0)
Neutrophils: 58 %
Platelets: 234 10*3/uL (ref 150–450)
RBC: 4.29 x10E6/uL (ref 3.77–5.28)
RDW: 12.7 % (ref 11.7–15.4)
WBC: 5.4 10*3/uL (ref 3.4–10.8)

## 2021-12-26 LAB — LIPID PANEL
Chol/HDL Ratio: 1.8 ratio (ref 0.0–4.4)
Cholesterol, Total: 147 mg/dL (ref 100–199)
HDL: 81 mg/dL (ref >39)
LDL Chol Calc (NIH): 52 mg/dL (ref 0–99)
Triglycerides: 70 mg/dL (ref 0–149)
VLDL Cholesterol Cal: 14 mg/dL (ref 5–40)

## 2021-12-26 LAB — CMP14+EGFR
ALT: 27 IU/L (ref 0–32)
AST: 24 IU/L (ref 0–40)
Albumin/Globulin Ratio: 1.7 (ref 1.2–2.2)
Albumin: 4.1 g/dL (ref 3.6–4.6)
Alkaline Phosphatase: 55 IU/L (ref 44–121)
BUN/Creatinine Ratio: 15 (ref 12–28)
BUN: 14 mg/dL (ref 8–27)
Bilirubin Total: 0.3 mg/dL (ref 0.0–1.2)
CO2: 25 mmol/L (ref 20–29)
Calcium: 9.7 mg/dL (ref 8.7–10.3)
Chloride: 100 mmol/L (ref 96–106)
Creatinine, Ser: 0.93 mg/dL (ref 0.57–1.00)
Globulin, Total: 2.4 g/dL (ref 1.5–4.5)
Glucose: 109 mg/dL — ABNORMAL HIGH (ref 70–99)
Potassium: 4.3 mmol/L (ref 3.5–5.2)
Sodium: 137 mmol/L (ref 134–144)
Total Protein: 6.5 g/dL (ref 6.0–8.5)
eGFR: 62 mL/min/{1.73_m2} (ref >59)

## 2022-01-13 ENCOUNTER — Telehealth: Payer: Self-pay | Admitting: Family Medicine

## 2022-01-13 NOTE — Telephone Encounter (Signed)
Daughter states that the eye drops are expensive and advised them to call eye doctor who prescribes them.

## 2022-01-13 NOTE — Telephone Encounter (Signed)
Daughter would like to know if patient is not already on generic medication can it be sent in as generic.  She states she is in the donut hole.

## 2022-01-13 NOTE — Telephone Encounter (Signed)
Is there a particular medication she is having a hard time affording?

## 2022-01-27 ENCOUNTER — Other Ambulatory Visit: Payer: Self-pay | Admitting: Family Medicine

## 2022-01-27 DIAGNOSIS — I10 Essential (primary) hypertension: Secondary | ICD-10-CM

## 2022-01-29 ENCOUNTER — Encounter (INDEPENDENT_AMBULATORY_CARE_PROVIDER_SITE_OTHER): Payer: Self-pay | Admitting: Ophthalmology

## 2022-01-29 ENCOUNTER — Ambulatory Visit (INDEPENDENT_AMBULATORY_CARE_PROVIDER_SITE_OTHER): Payer: Medicare Other | Admitting: Ophthalmology

## 2022-01-29 ENCOUNTER — Encounter (INDEPENDENT_AMBULATORY_CARE_PROVIDER_SITE_OTHER): Payer: Medicare Other | Admitting: Ophthalmology

## 2022-01-29 DIAGNOSIS — H43813 Vitreous degeneration, bilateral: Secondary | ICD-10-CM

## 2022-01-29 DIAGNOSIS — H348122 Central retinal vein occlusion, left eye, stable: Secondary | ICD-10-CM | POA: Diagnosis not present

## 2022-01-29 DIAGNOSIS — H401132 Primary open-angle glaucoma, bilateral, moderate stage: Secondary | ICD-10-CM

## 2022-01-29 MED ORDER — TRAVOPROST (BAK FREE) 0.004 % OP SOLN: 1.0000 [drp] | Freq: Every day | OPHTHALMIC | 11 refills | Status: DC

## 2022-01-29 MED ORDER — TRAVOPROST (BAK FREE) 0.004 % OP SOLN: 1.0000 [drp] | Freq: Every day | OPHTHALMIC | 11 refills | Status: AC

## 2022-01-29 MED ORDER — DORZOLAMIDE HCL 2 % OP SOLN: 1.0000 [drp] | Freq: Two times a day (BID) | OPHTHALMIC | 12 refills | Status: DC

## 2022-01-29 NOTE — Addendum Note (Signed)
Addended by: Fawn Kirk A on: 01/29/2022 04:56 PM   Modules accepted: Orders

## 2022-01-29 NOTE — Addendum Note (Signed)
Addended by: Fawn Kirk A on: 01/29/2022 02:54 PM   Modules accepted: Orders

## 2022-01-29 NOTE — Assessment & Plan Note (Signed)
Physiologic and stable OU 

## 2022-01-29 NOTE — Progress Notes (Signed)
01/29/2022     CHIEF COMPLAINT Patient presents for  Chief Complaint  Patient presents with   Central Retinal Vein Occlusion   OS in the past last injection required for CME 2018   HISTORY OF PRESENT ILLNESS: Priscilla Daniels is a 81 y.o. female who presents to the clinic today for:   HPI   1 YR FU OU OCT FP. Pt stated vision is dim in the left eye. Pt reports pain in both eyes and is ongoing for a couple of weeks. Pt describe the pain as a sharp pain. Pt claims, "My eyes are tired a lot. When I wake up in the morning, I would feel the pain in my eyes but then it would go away. I would get headaches."    Last edited by Angeline Slim on 01/29/2022  9:45 AM.      Referring physician: Smitty Cords, OD [address] [place],  [place] [address]  HISTORICAL INFORMATION:   Selected notes from the MEDICAL RECORD NUMBER    Lab Results  Component Value Date   HGBA1C 5.8 (H) 12/25/2021     CURRENT MEDICATIONS: Current Outpatient Medications (Ophthalmic Drugs)  Medication Sig   dorzolamide (TRUSOPT) 2 % ophthalmic solution Place 1 drop into both eyes 2 (two) times daily.   TRAVATAN Z 0.004 % SOLN ophthalmic solution    No current facility-administered medications for this visit. (Ophthalmic Drugs)   Current Outpatient Medications (Other)  Medication Sig   acetaminophen (TYLENOL) 500 MG tablet Take 500 mg by mouth every 6 (six) hours as needed.   aspirin 81 MG EC tablet Take 81 mg by mouth daily. Swallow whole.   Calcium Carb-Cholecalciferol (CALCIUM 600/VITAMIN D PO) Take by mouth. With 2000U vitamin d , 2 tablets daily   lisinopril-hydrochlorothiazide (ZESTORETIC) 20-12.5 MG tablet TAKE ONE (1) TABLET BY MOUTH EVERY DAY   mirabegron ER (MYRBETRIQ) 50 MG TB24 tablet TAKE ONE (1) TABLET BY MOUTH EVERY DAY   rosuvastatin (CRESTOR) 10 MG tablet Take 1 tablet (10 mg total) by mouth daily.   No current facility-administered medications for this visit. (Other)      REVIEW OF  SYSTEMS: ROS   Negative for: Constitutional, Gastrointestinal, Neurological, Skin, Genitourinary, Musculoskeletal, HENT, Endocrine, Cardiovascular, Eyes, Respiratory, Psychiatric, Allergic/Imm, Heme/Lymph Last edited by Angeline Slim on 01/29/2022  9:44 AM.       ALLERGIES Allergies  Allergen Reactions   Lipitor [Atorvastatin Calcium]    Septra [Bactrim]    Sulfa Antibiotics     PAST MEDICAL HISTORY Past Medical History:  Diagnosis Date   Abnormal vaginal Pap smear    Candidiasis    cuteneous    Chronic UTI    COPD (chronic obstructive pulmonary disease) (HCC) 2010   Dx by CXR    DM (diabetes mellitus) (HCC)    Fatigue    Glaucoma    Hemorrhage in optic nerve sheath of left eye 01/23/2020   Hyperlipidemia    Diet Controlled    NIDDM (non-insulin dependent diabetes mellitus)    Osteopenia 03/12/2021   Phlebitis    Strep pharyngitis    Vaginal atrophy    Vertigo    Past Surgical History:  Procedure Laterality Date   CERVICAL CONE BIOPSY     GLAUCOMA SURGERY  12/04 & 07/05/03   left eye cataract surgery  01/29/2009   right eye cataract surgery  01/13/09   VARICOSE VEIN SURGERY     both legs     FAMILY HISTORY Family History  Problem Relation Age of Onset   Stroke Mother    Cancer Father    Cancer Sister    Diabetes Brother    Hyperlipidemia Brother    Hyperlipidemia Brother     SOCIAL HISTORY Social History   Tobacco Use   Smoking status: Never   Smokeless tobacco: Never  Vaping Use   Vaping Use: Never used  Substance Use Topics   Alcohol use: No   Drug use: No         OPHTHALMIC EXAM:  Base Eye Exam     Visual Acuity (ETDRS)       Right Left   Dist cc 20/20 -1 20/40 -2   Dist ph cc  20/30 -2    Correction: Glasses         Tonometry (Tonopen, 9:53 AM)       Right Left   Pressure 14 14         Pupils       Pupils APD   Right PERRL None   Left PERRL None         Visual Fields       Left Right     Full   Restrictions  Partial outer inferior temporal, inferior nasal deficiencies          Extraocular Movement       Right Left    Full Full         Neuro/Psych     Oriented x3: Yes   Mood/Affect: Normal         Dilation     Both eyes: 1.0% Mydriacyl, 2.5% Phenylephrine @ 9:53 AM           Slit Lamp and Fundus Exam     External Exam       Right Left   External Normal Normal         Slit Lamp Exam       Right Left   Lids/Lashes Normal Normal   Conjunctiva/Sclera White and quiet White and quiet   Cornea Clear Clear   Anterior Chamber Deep and quiet Deep and quiet   Iris Round and reactive Round and reactive   Lens Centered posterior chamber intraocular lens Centered posterior chamber intraocular lens   Anterior Vitreous Normal Normal         Fundus Exam       Right Left   Posterior Vitreous Posterior vitreous detachment Posterior vitreous detachment   Disc Normal Collaterals on the nerve, 1+ pallor   C/D Ratio 0.4 0.8   Macula Drusen, Early age related macular degeneration, no macular thickening Hard drusen, Early age related macular degeneration   Vessels Normal Old compensated CRVO, no active leakages, no hemorrhages   Periphery Normal Normal            IMAGING AND PROCEDURES  Imaging and Procedures for 01/29/22  OCT, Retina - OU - Both Eyes       Right Eye Quality was good. Scan locations included subfoveal. Central Foveal Thickness: 258. Findings include no IRF, no SRF, abnormal foveal contour, retinal drusen .   Left Eye Quality was good. Scan locations included subfoveal. Central Foveal Thickness: 238. Progression has been stable. Findings include no IRF, no SRF, abnormal foveal contour, retinal drusen .   Notes OS with incidental PVD, diffuse regions of atrophy from prior CRVO with secondary ischemia, now inactive     Color Fundus Photography Optos - OU - Both Eyes       Right  Eye Progression has been stable. Disc findings include normal  observations. Macula : normal observations. Vessels : normal observations. Periphery : normal observations.   Left Eye Progression has been stable. Disc findings include increased cup to disc ratio, pallor.   Notes Old collaterals on the nerve and large cup-to-disc, old retinal vein occlusion stable no CME OS, no change OS over time no recurrence of macular edema             ASSESSMENT/PLAN:  Primary open angle glaucoma of both eyes, moderate stage Bilateral open-angle glaucoma, stable over time.  Continue topical medications  Central retinal vein occlusion of left eye Compensated old CRVO now with collateral vessels on the optic nerve stable, no recurrence of CME.  Posterior vitreous detachment, both eyes Physiologic and stable OU     ICD-10-CM   1. Stable central retinal vein occlusion of left eye  H34.8122 OCT, Retina - OU - Both Eyes    Color Fundus Photography Optos - OU - Both Eyes    2. Primary open angle glaucoma of both eyes, moderate stage  H40.1132     3. Posterior vitreous detachment, both eyes  H43.813       1.  OS doing very well, no recurrence of CME from CRV O.  2.  Occasional evanescent sharp shooting pains not pathologic.  No specific therapy warranted OS.  3.  Continue topical glaucoma medications as scheduled   4.  Follow-up Dr. Okey Regal as scheduled Ophthalmic Meds Ordered this visit:  No orders of the defined types were placed in this encounter.      Return in about 1 year (around 01/30/2023) for DILATE OU, OCT, COLOR FP.  There are no Patient Instructions on file for this visit.   Explained the diagnoses, plan, and follow up with the patient and they expressed understanding.  Patient expressed understanding of the importance of proper follow up care.   Clent Demark Tyshay Adee M.D. Diseases & Surgery of the Retina and Vitreous [facility] [place] 01/29/22     Abbreviations: M myopia (nearsighted); A astigmatism; H hyperopia  (farsighted); P presbyopia; Mrx spectacle prescription;  CTL contact lenses; OD right eye; OS left eye; OU both eyes  XT exotropia; ET esotropia; PEK punctate epithelial keratitis; PEE punctate epithelial erosions; DES dry eye syndrome; MGD meibomian gland dysfunction; ATs artificial tears; PFAT's preservative free artificial tears; [place] nuclear sclerotic cataract; PSC posterior subcapsular cataract; ERM epi-retinal membrane; PVD posterior vitreous detachment; RD retinal detachment; DM diabetes mellitus; DR diabetic retinopathy; NPDR non-proliferative diabetic retinopathy; PDR proliferative diabetic retinopathy; CSME clinically significant macular edema; DME diabetic macular edema; dbh dot blot hemorrhages; CWS cotton wool spot; POAG primary open angle glaucoma; C/D cup-to-disc ratio; HVF humphrey visual field; GVF goldmann visual field; OCT optical coherence tomography; IOP intraocular pressure; BRVO Branch retinal vein occlusion; CRVO central retinal vein occlusion; CRAO central retinal artery occlusion; BRAO branch retinal artery occlusion; RT retinal tear; SB scleral buckle; PPV pars plana vitrectomy; VH Vitreous hemorrhage; PRP panretinal laser photocoagulation; IVK intravitreal kenalog; VMT vitreomacular traction; MH Macular hole;  NVD neovascularization of the disc; NVE neovascularization elsewhere; AREDS age related eye disease study; ARMD age related macular degeneration; POAG primary open angle glaucoma; EBMD epithelial/anterior basement membrane dystrophy; ACIOL anterior chamber intraocular lens; IOL intraocular lens; PCIOL posterior chamber intraocular lens; Phaco/IOL phacoemulsification with intraocular lens placement; [place] photorefractive keratectomy; LASIK laser assisted in situ keratomileusis; HTN hypertension; DM diabetes mellitus; COPD chronic obstructive pulmonary disease

## 2022-01-29 NOTE — Assessment & Plan Note (Signed)
Bilateral open-angle glaucoma, stable over time.  Continue topical medications

## 2022-01-29 NOTE — Assessment & Plan Note (Signed)
Compensated old CRVO now with collateral vessels on the optic nerve stable, no recurrence of CME.

## 2022-02-02 ENCOUNTER — Other Ambulatory Visit: Payer: Self-pay | Admitting: Family Medicine

## 2022-03-06 ENCOUNTER — Other Ambulatory Visit: Payer: Self-pay | Admitting: Family Medicine

## 2022-03-06 DIAGNOSIS — I700 Atherosclerosis of aorta: Secondary | ICD-10-CM

## 2022-03-06 DIAGNOSIS — E785 Hyperlipidemia, unspecified: Secondary | ICD-10-CM

## 2022-04-22 ENCOUNTER — Ambulatory Visit (INDEPENDENT_AMBULATORY_CARE_PROVIDER_SITE_OTHER): Payer: Medicare Other

## 2022-04-22 ENCOUNTER — Other Ambulatory Visit: Payer: Self-pay | Admitting: Family Medicine

## 2022-04-22 DIAGNOSIS — Z23 Encounter for immunization: Secondary | ICD-10-CM

## 2022-05-21 ENCOUNTER — Ambulatory Visit (INDEPENDENT_AMBULATORY_CARE_PROVIDER_SITE_OTHER): Payer: Medicare Other | Admitting: Family Medicine

## 2022-05-21 ENCOUNTER — Encounter: Payer: Self-pay | Admitting: Family Medicine

## 2022-05-21 DIAGNOSIS — R399 Unspecified symptoms and signs involving the genitourinary system: Secondary | ICD-10-CM | POA: Diagnosis not present

## 2022-05-21 LAB — URINALYSIS, ROUTINE W REFLEX MICROSCOPIC
Bilirubin, UA: NEGATIVE
Glucose, UA: NEGATIVE
Ketones, UA: NEGATIVE
Leukocytes,UA: NEGATIVE
Nitrite, UA: NEGATIVE
Protein,UA: NEGATIVE
RBC, UA: NEGATIVE
Specific Gravity, UA: 1.01 (ref 1.005–1.030)
Urobilinogen, Ur: 0.2 mg/dL (ref 0.2–1.0)
pH, UA: 7 (ref 5.0–7.5)

## 2022-05-21 NOTE — Progress Notes (Signed)
   Virtual Visit  Note Due to COVID-19 pandemic this visit was conducted virtually. This visit type was conducted due to national recommendations for restrictions regarding the COVID-19 Pandemic (e.g. social distancing, sheltering in place) in an effort to limit this patient's exposure and mitigate transmission in our community. All issues noted in this document were discussed and addressed.  A physical exam was not performed with this format.  I connected with Priscilla Daniels on 05/21/22 at [phone] by telephone and verified that I am speaking with the correct person using two identifiers. Priscilla Daniels is currently located at home and her daughter is currently with her during the visit. The provider, Gabriel Earing, FNP is located in their office at time of visit.  I discussed the limitations, risks, security and privacy concerns of performing an evaluation and management service by telephone and the availability of in person appointments. I also discussed with the patient that there may be a patient responsible charge related to this service. The patient expressed understanding and agreed to proceed.  CC: UTI  History and Present Illness:  HPI History was provided by [facility] and her daughter Priscilla Daniels. [name] has had UTI symptoms for about 1 week that have been gradually worsening. She reports cloudy urine with a foul odor, dysuria, decreased appetite, and low back pain. There is a history of recurrent UTIs. She denies fever, nausea, or vomiting. She will bring urine by for a UA.    ROS As per HPI.   Observations/Objective: Alert and oriented x 3. Able to speak in full sentences without difficulty.   Assessment and Plan: Priscilla Daniels was seen today for urinary tract infection.  Diagnoses and all orders for this visit:  UTI symptoms UA negative today. Culture is pending. Discussed hydration. Return to office for new or worsening symptoms, or if symptoms persist.  -     Urinalysis, Routine w  reflex microscopic -     Urine Culture     Follow Up Instructions: As needed.     I discussed the assessment and treatment plan with the patient. The patient was provided an opportunity to ask questions and all were answered. The patient agreed with the plan and demonstrated an understanding of the instructions.   The patient was advised to call back or seek an in-person evaluation if the symptoms worsen or if the condition fails to improve as anticipated.  The above assessment and management plan was discussed with the patient. The patient verbalized understanding of and has agreed to the management plan. Patient is aware to call the clinic if symptoms persist or worsen. Patient is aware when to return to the clinic for a follow-up visit. Patient educated on when it is appropriate to go to the emergency department.   Time call ended: [phone]   I provided 12 minutes of  non face-to-face time during this encounter.    Gabriel Earing, FNP

## 2022-05-24 LAB — URINE CULTURE

## 2022-05-25 ENCOUNTER — Other Ambulatory Visit: Payer: Self-pay | Admitting: Family Medicine

## 2022-05-25 DIAGNOSIS — N3000 Acute cystitis without hematuria: Secondary | ICD-10-CM

## 2022-05-25 MED ORDER — CEPHALEXIN 500 MG PO CAPS: 500.0000 mg | ORAL_CAPSULE | Freq: Two times a day (BID) | ORAL | 0 refills | Status: DC

## 2022-07-01 ENCOUNTER — Encounter: Payer: Self-pay | Admitting: Family Medicine

## 2022-07-01 ENCOUNTER — Ambulatory Visit (INDEPENDENT_AMBULATORY_CARE_PROVIDER_SITE_OTHER): Payer: Medicare Other | Admitting: Family Medicine

## 2022-07-01 ENCOUNTER — Ambulatory Visit: Payer: Medicare Other | Admitting: Family Medicine

## 2022-07-01 VITALS — BP 128/72 | HR 65 | Temp 98.1°F | Ht 67.0 in | Wt 128.1 lb

## 2022-07-01 DIAGNOSIS — M1990 Unspecified osteoarthritis, unspecified site: Secondary | ICD-10-CM | POA: Diagnosis not present

## 2022-07-01 DIAGNOSIS — H401132 Primary open-angle glaucoma, bilateral, moderate stage: Secondary | ICD-10-CM | POA: Diagnosis not present

## 2022-07-01 DIAGNOSIS — I129 Hypertensive chronic kidney disease with stage 1 through stage 4 chronic kidney disease, or unspecified chronic kidney disease: Secondary | ICD-10-CM

## 2022-07-01 DIAGNOSIS — E785 Hyperlipidemia, unspecified: Secondary | ICD-10-CM | POA: Diagnosis not present

## 2022-07-01 DIAGNOSIS — I700 Atherosclerosis of aorta: Secondary | ICD-10-CM | POA: Diagnosis not present

## 2022-07-01 DIAGNOSIS — N1831 Chronic kidney disease, stage 3a: Secondary | ICD-10-CM | POA: Diagnosis not present

## 2022-07-01 DIAGNOSIS — I10 Essential (primary) hypertension: Secondary | ICD-10-CM | POA: Diagnosis not present

## 2022-07-01 DIAGNOSIS — Z7409 Other reduced mobility: Secondary | ICD-10-CM

## 2022-07-01 DIAGNOSIS — F339 Major depressive disorder, recurrent, unspecified: Secondary | ICD-10-CM

## 2022-07-01 DIAGNOSIS — R7303 Prediabetes: Secondary | ICD-10-CM | POA: Diagnosis not present

## 2022-07-01 DIAGNOSIS — R350 Frequency of micturition: Secondary | ICD-10-CM

## 2022-07-01 DIAGNOSIS — H3562 Retinal hemorrhage, left eye: Secondary | ICD-10-CM

## 2022-07-01 DIAGNOSIS — N390 Urinary tract infection, site not specified: Secondary | ICD-10-CM

## 2022-07-01 DIAGNOSIS — Z789 Other specified health status: Secondary | ICD-10-CM

## 2022-07-01 LAB — BAYER DCA HB A1C WAIVED: HB A1C (BAYER DCA - WAIVED): 5.9 % — ABNORMAL HIGH (ref 4.8–5.6)

## 2022-07-01 NOTE — Progress Notes (Signed)
Established Patient Office Visit  Subjective   Patient ID: Priscilla Daniels, female    DOB: 1940-11-09  Age: 82 y.o. MRN: [id number]  Chief Complaint  Patient presents with   Medical Management of Chronic Issues   Hyperlipidemia   Prediabetes    HPI HTN Complaint with meds - Yes Current Medications - lisinopril-HCTZ  Pertinent ROS:  Fatigue - chronic, baseline Chest pain - No Dyspnea - No Palpitations - No LE edema - No  2. Prediabetes Last A1c was <6. Diet controlled.   3.  HLD On crestor. Last LDL was 52.  4. Urinary frequency She has chronic urinary symptoms with intermittent dysuria, urgency, frequency, and urge incontinence. She also has a history of recurrent UTIs. She has been drinking cranberry juice recently with some improvement. She was prescribed myrbetriq, but was unable to afford this. She denies fever, flank pain, abdominal pain. She would like to have her urine checked today while she is here.   5. Eyes  She has glaucoma in both eyes and has had a retinol hemorrhage in the left eye. She is established with ophthalmology for this.   6. Arthritis She has been taking tylenol prn for arthritis. She has impaired mobility due to her arthritis. She uses a rolling walker at home to ambulation. Her daughter lives with her. Priscilla Daniels uses a shower chair for bathing. Her daughter helps with her ADLs and cooks for her.      07/01/2022   11:15 AM 12/25/2021   11:16 AM 10/03/2021   10:35 AM  Depression screen PHQ 2/9  Decreased Interest 0 3 3  Down, Depressed, Hopeless 0 0 3  PHQ - 2 Score 0 3 6  Altered sleeping 0 0 2  Tired, decreased energy 0 3 3  Change in appetite 0 0 1  Feeling bad or failure about yourself  0 0 1  Trouble concentrating 0 3 3  Moving slowly or fidgety/restless 0 2 0  Suicidal thoughts 0 0 0  PHQ-9 Score 0 11 16  Difficult doing work/chores Not difficult at all Somewhat difficult Very difficult      07/01/2022   11:15 AM 12/25/2021   11:17  AM 09/24/2021    1:30 PM 08/20/2021   10:31 AM  GAD 7 : Generalized Anxiety Score  Nervous, Anxious, on Edge 0 1 2 1   Control/stop worrying 0 3 3 3   Worry too much - different things 0 1 3 3   Trouble relaxing 0 0 1 1  Restless 0 0 0 0  Easily annoyed or irritable 3 1 3 2   Afraid - awful might happen 0 0 3 2  Total GAD 7 Score 3 6 15 12   Anxiety Difficulty Not difficult at all Not difficult at all Somewhat difficult Very difficult     Past Medical History:  Diagnosis Date   Abnormal vaginal Pap smear    Candidiasis    cuteneous    Chronic UTI    COPD (chronic obstructive pulmonary disease) ([place]) 2010   Dx by CXR    DM (diabetes mellitus) ([place])    Fatigue    Glaucoma    Hemorrhage in optic nerve sheath of left eye 01/23/2020   Hyperlipidemia    Diet Controlled    NIDDM (non-insulin dependent diabetes mellitus)    Osteopenia 03/12/2021   Phlebitis    Strep pharyngitis    Vaginal atrophy    Vertigo       ROS As per HPI.  Objective:     BP 128/72   Pulse 65   Temp 98.1 F (36.7 C) (Temporal)   Ht 5\' 7"  (1.702 m)   Wt 128 lb 2 oz (58.1 kg)   SpO2 100%   BMI 20.07 kg/m    Physical Exam Vitals and nursing note reviewed.  Constitutional:      General: She is not in acute distress.    Appearance: She is not ill-appearing, toxic-appearing or diaphoretic.  Neck:     Thyroid: No thyroid mass, thyromegaly or thyroid tenderness.  Cardiovascular:     Rate and Rhythm: Normal rate and regular rhythm.  Pulmonary:     Effort: Pulmonary effort is normal. No respiratory distress.     Breath sounds: Normal breath sounds. No wheezing, rhonchi or rales.  Abdominal:     General: Bowel sounds are normal. There is no distension.     Palpations: Abdomen is soft.     Tenderness: There is no abdominal tenderness. There is no right CVA tenderness, left CVA tenderness, guarding or rebound.  Musculoskeletal:     Cervical back: Neck supple.  Skin:    General: Skin is warm and  dry.  Neurological:     Mental Status: She is alert and oriented to person, place, and time.     Gait: Gait abnormal (arrives in wheelchair).  Psychiatric:        Mood and Affect: Mood normal.        Behavior: Behavior normal.        Thought Content: Thought content normal.        Judgment: Judgment normal.      No results found for any visits on 07/01/22.    The ASCVD Risk score (Arnett DK, et al., 2019) failed to calculate for the following reasons:   The 2019 ASCVD risk score is only valid for ages 45 to 38    Assessment & Plan:   Priscilla Daniels was seen today for medical management of chronic issues, hyperlipidemia and prediabetes.  Diagnoses and all orders for this visit:  Primary hypertension Well controlled on current regimen. Continue lisinopril-HCTZ.  -     CMP14+EGFR -     CBC with Differential/Platelet  Prediabetes A1c pending. Diet controlled.  -     Bayer DCA Hb A1c Waived -     Bayer DCA Hb A1c Waived  Stage 3a chronic kidney disease ([place]) Labs pending. Avoid NSAIDs.  -     CMP14+EGFR  Hyperlipidemia with target LDL less than 100 On crestor. Last LDL at goal.   Atherosclerosis of aorta (HCC) On statin.   Urinary frequency Chronic. Urine culture pending.  -     Urinalysis, Routine w reflex microscopic -     Urine Culture  Recurrent UTI -     Urine Culture  Depression, recurrent (HCC) Well controlled without medication.   Retinal hemorrhage of left eye Primary open angle glaucoma of both eyes, moderate stage  Impaired mobility and ADLs Daughter is helping with care around the clock.   Arthritis Continue tylenol prn.   Return in about 6 months (around 12/30/2022) for CPE.   The patient indicates understanding of these issues and agrees with the plan.   Gwenlyn Perking, FNP

## 2022-07-02 ENCOUNTER — Telehealth: Payer: Self-pay | Admitting: Family Medicine

## 2022-07-02 ENCOUNTER — Other Ambulatory Visit: Payer: Self-pay | Admitting: Family Medicine

## 2022-07-02 DIAGNOSIS — E785 Hyperlipidemia, unspecified: Secondary | ICD-10-CM

## 2022-07-02 DIAGNOSIS — I700 Atherosclerosis of aorta: Secondary | ICD-10-CM

## 2022-07-02 DIAGNOSIS — I10 Essential (primary) hypertension: Secondary | ICD-10-CM

## 2022-07-02 DIAGNOSIS — N3941 Urge incontinence: Secondary | ICD-10-CM

## 2022-07-02 LAB — CMP14+EGFR
ALT: 23 IU/L (ref 0–32)
AST: 20 IU/L (ref 0–40)
Albumin/Globulin Ratio: 1.6 (ref 1.2–2.2)
Albumin: 4.1 g/dL (ref 3.7–4.7)
Alkaline Phosphatase: 76 IU/L (ref 44–121)
BUN/Creatinine Ratio: 19 (ref 12–28)
BUN: 15 mg/dL (ref 8–27)
Bilirubin Total: 0.4 mg/dL (ref 0.0–1.2)
CO2: 25 mmol/L (ref 20–29)
Calcium: 9.6 mg/dL (ref 8.7–10.3)
Chloride: 97 mmol/L (ref 96–106)
Creatinine, Ser: 0.79 mg/dL (ref 0.57–1.00)
Globulin, Total: 2.6 g/dL (ref 1.5–4.5)
Glucose: 106 mg/dL — ABNORMAL HIGH (ref 70–99)
Potassium: 4.2 mmol/L (ref 3.5–5.2)
Sodium: 136 mmol/L (ref 134–144)
Total Protein: 6.7 g/dL (ref 6.0–8.5)
eGFR: 75 mL/min/{1.73_m2} (ref >59)

## 2022-07-02 LAB — CBC WITH DIFFERENTIAL/PLATELET
Basophils Absolute: 0 10*3/uL (ref 0.0–0.2)
Basos: 1 %
EOS (ABSOLUTE): 0.2 10*3/uL (ref 0.0–0.4)
Eos: 3 %
Hematocrit: 36.7 % (ref 34.0–46.6)
Hemoglobin: 12.1 g/dL (ref 11.1–15.9)
Immature Grans (Abs): 0 10*3/uL (ref 0.0–0.1)
Immature Granulocytes: 0 %
Lymphocytes Absolute: 1.5 10*3/uL (ref 0.7–3.1)
Lymphs: 24 %
MCH: 28.9 pg (ref 26.6–33.0)
MCHC: 33 g/dL (ref 31.5–35.7)
MCV: 88 fL (ref 79–97)
Monocytes Absolute: 0.6 10*3/uL (ref 0.1–0.9)
Monocytes: 10 %
Neutrophils Absolute: 3.9 10*3/uL (ref 1.4–7.0)
Neutrophils: 62 %
Platelets: 240 10*3/uL (ref 150–450)
RBC: 4.19 x10E6/uL (ref 3.77–5.28)
RDW: 12.1 % (ref 11.7–15.4)
WBC: 6.2 10*3/uL (ref 3.4–10.8)

## 2022-07-02 MED ORDER — MIRABEGRON ER 25 MG PO TB24: 25.0000 mg | ORAL_TABLET | Freq: Every day | ORAL | 1 refills | Status: DC

## 2022-07-02 NOTE — Telephone Encounter (Signed)
Pt aware myrbetriq sent in.

## 2022-07-02 NOTE — Telephone Encounter (Signed)
Yes, myrbetriq can be helpful. I have sent in again for her to try.

## 2022-07-03 ENCOUNTER — Other Ambulatory Visit: Payer: Medicare Other

## 2022-07-03 ENCOUNTER — Other Ambulatory Visit: Payer: Self-pay | Admitting: Family Medicine

## 2022-07-03 DIAGNOSIS — R350 Frequency of micturition: Secondary | ICD-10-CM | POA: Diagnosis not present

## 2022-07-03 DIAGNOSIS — N390 Urinary tract infection, site not specified: Secondary | ICD-10-CM | POA: Diagnosis not present

## 2022-07-03 LAB — MICROSCOPIC EXAMINATION

## 2022-07-03 LAB — URINALYSIS, ROUTINE W REFLEX MICROSCOPIC
Bilirubin, UA: NEGATIVE
Glucose, UA: NEGATIVE
Ketones, UA: NEGATIVE
Nitrite, UA: NEGATIVE
Protein,UA: NEGATIVE
Specific Gravity, UA: 1.01 (ref 1.005–1.030)
Urobilinogen, Ur: 0.2 mg/dL (ref 0.2–1.0)
pH, UA: 6.5 (ref 5.0–7.5)

## 2022-07-03 MED ORDER — AMOXICILLIN-POT CLAVULANATE 875-125 MG PO TABS: 1.0000 | ORAL_TABLET | Freq: Two times a day (BID) | ORAL | 0 refills | Status: AC

## 2022-07-07 LAB — URINE CULTURE

## 2022-09-07 DIAGNOSIS — H401131 Primary open-angle glaucoma, bilateral, mild stage: Secondary | ICD-10-CM | POA: Diagnosis not present

## 2022-09-07 DIAGNOSIS — Z961 Presence of intraocular lens: Secondary | ICD-10-CM | POA: Diagnosis not present

## 2022-09-07 DIAGNOSIS — H353111 Nonexudative age-related macular degeneration, right eye, early dry stage: Secondary | ICD-10-CM | POA: Diagnosis not present

## 2022-09-07 DIAGNOSIS — H353122 Nonexudative age-related macular degeneration, left eye, intermediate dry stage: Secondary | ICD-10-CM | POA: Diagnosis not present

## 2022-10-07 ENCOUNTER — Ambulatory Visit (INDEPENDENT_AMBULATORY_CARE_PROVIDER_SITE_OTHER): Payer: Medicare Other

## 2022-10-07 VITALS — Ht 67.0 in | Wt 128.0 lb

## 2022-10-07 DIAGNOSIS — Z0000 Encounter for general adult medical examination without abnormal findings: Secondary | ICD-10-CM

## 2022-10-07 NOTE — Patient Instructions (Signed)
Priscilla Daniels , Thank you for taking time to come for your Medicare Wellness Visit. I appreciate your ongoing commitment to your health goals. Please review the following plan we discussed and let me know if I can assist you in the future.   These are the goals we discussed:  Goals      Exercise 150 min/wk Moderate Activity     10/02/2019 AWV Goal: Exercise for General Health  Patient will verbalize understanding of the benefits of increased physical activity: Exercising regularly is important. It will improve your overall fitness, flexibility, and endurance. Regular exercise also will improve your overall health. It can help you control your weight, reduce stress, and improve your bone density. Over the next year, patient will increase physical activity as tolerated with a goal of at least 150 minutes of moderate physical activity per week.  You can tell that you are exercising at a moderate intensity if your heart starts beating faster and you start breathing faster but can still hold a conversation. Moderate-intensity exercise ideas include: Walking 1 mile (1.6 km) in about 15 minutes Biking Hiking Golfing Dancing Water aerobics Patient will verbalize understanding of everyday activities that increase physical activity by providing examples like the following: Yard work, such as: Insurance underwriter Gardening Washing windows or floors Patient will be able to explain general safety guidelines for exercising:  Before you start a new exercise program, talk with your health care provider. Do not exercise so much that you hurt yourself, feel dizzy, or get very short of breath. Wear comfortable clothes and wear shoes with good support. Drink plenty of water while you exercise to prevent dehydration or heat stroke. Work out until your breathing and your heartbeat get faster.      Have 3 meals a day     Goals  Addressed             This Visit's Progress    Exercise 150 min/wk Moderate Activity   On track    10/02/2019 AWV Goal: Exercise for General Health  Patient will verbalize understanding of the benefits of increased physical activity: Exercising regularly is important. It will improve your overall fitness, flexibility, and endurance. Regular exercise also will improve your overall health. It can help you control your weight, reduce stress, and improve your bone density. Over the next year, patient will increase physical activity as tolerated with a goal of at least 150 minutes of moderate physical activity per week.  You can tell that you are exercising at a moderate intensity if your heart starts beating faster and you start breathing faster but can still hold a conversation. Moderate-intensity exercise ideas include: Walking 1 mile (1.6 km) in about 15 minutes Biking Hiking Golfing Dancing Water aerobics Patient will verbalize understanding of everyday activities that increase physical activity by providing examples like the following: Yard work, such as: Insurance underwriter Gardening Washing windows or floors Patient will be able to explain general safety guidelines for exercising:  Before you start a new exercise program, talk with your health care provider. Do not exercise so much that you hurt yourself, feel dizzy, or get very short of breath. Wear comfortable clothes and wear shoes with good support. Drink plenty of water while you exercise to prevent dehydration or heat stroke. Work out until your breathing and your heartbeat get faster.  Have 3 meals a day   On track    10/02/2019 AWV Goal: Improved Nutrition/Diet  Patient will verbalize understanding that diet plays an important role in overall health and that a poor diet is a risk factor for many chronic medical conditions.  Over the  next year, patient will improve self management of their diet by incorporating better variety, improved meal pattern, more consistent meal timing, increased physical activity, improved protein intake, and better food choices. Patient will utilize available community resources to help with food acquisition if needed (ex: food pantries, [address], etc) Patient will work with nutrition specialist if a referral was made                This is a list of the screening recommended for you and due dates:  Health Maintenance  Topic Date Due   DTaP/Tdap/Td vaccine (1 - Tdap) Never done   Pneumonia Vaccine (1 of 1 - PCV) 07/02/2023*   COVID-19 Vaccine (4 - 2023-24 season) 07/18/2023*   Zoster (Shingles) Vaccine (1 of 2) 09/30/2023*   Flu Shot  01/21/2023   Medicare Annual Wellness Visit  10/07/2023   DEXA scan (bone density measurement)  03/12/2024   HPV Vaccine  Aged Out  *Topic was postponed. The date shown is not the original due date.    Advanced directives: Advance directive discussed with you today. I have provided a copy for you to complete at home and have notarized. Once this is complete please bring a copy in to our office so we can scan it into your chart.   Conditions/risks identified: Aim for 30 minutes of exercise or brisk walking, 6-8 glasses of water, and 5 servings of fruits and vegetables each day.   Next appointment: Follow up in one year for your annual wellness visit    Preventive Care 65 Years and Older, Female Preventive care refers to lifestyle choices and visits with your health care provider that can promote health and wellness. What does preventive care include? A yearly physical exam. This is also called an annual well check. Dental exams once or twice a year. Routine eye exams. Ask your health care provider how often you should have your eyes checked. Personal lifestyle choices, including: Daily care of your teeth and gums. Regular physical activity. Eating  a healthy diet. Avoiding tobacco and drug use. Limiting alcohol use. Practicing safe sex. Taking low-dose aspirin every day. Taking vitamin and mineral supplements as recommended by your health care provider. What happens during an annual well check? The services and screenings done by your health care provider during your annual well check will depend on your age, overall health, lifestyle risk factors, and family history of disease. Counseling  Your health care provider may ask you questions about your: Alcohol use. Tobacco use. Drug use. Emotional well-being. Home and relationship well-being. Sexual activity. Eating habits. History of falls. Memory and ability to understand (cognition). Work and work Astronomer. Reproductive health. Screening  You may have the following tests or measurements: Height, weight, and BMI. Blood pressure. Lipid and cholesterol levels. These may be checked every 5 years, or more frequently if you are over 24 years old. Skin check. Lung cancer screening. You may have this screening every year starting at age 65 if you have a 30-pack-year history of smoking and currently smoke or have quit within the past 15 years. Fecal occult blood test (FOBT) of the stool. You may have this test every year starting at age 7. Flexible sigmoidoscopy or  colonoscopy. You may have a sigmoidoscopy every 5 years or a colonoscopy every 10 years starting at age 84. Hepatitis C blood test. Hepatitis B blood test. Sexually transmitted disease (STD) testing. Diabetes screening. This is done by checking your blood sugar (glucose) after you have not eaten for a while (fasting). You may have this done every 1-3 years. Bone density scan. This is done to screen for osteoporosis. You may have this done starting at age 92. Mammogram. This may be done every 1-2 years. Talk to your health care provider about how often you should have regular mammograms. Talk with your health care  provider about your test results, treatment options, and if necessary, the need for more tests. Vaccines  Your health care provider may recommend certain vaccines, such as: Influenza vaccine. This is recommended every year. Tetanus, diphtheria, and acellular pertussis (Tdap, Td) vaccine. You may need a Td booster every 10 years. Zoster vaccine. You may need this after age 28. Pneumococcal 13-valent conjugate (PCV13) vaccine. One dose is recommended after age 56. Pneumococcal polysaccharide (PPSV23) vaccine. One dose is recommended after age 10. Talk to your health care provider about which screenings and vaccines you need and how often you need them. This information is not intended to replace advice given to you by your health care provider. Make sure you discuss any questions you have with your health care provider. Document Released: 07/05/2015 Document Revised: 02/26/2016 Document Reviewed: 04/09/2015 Elsevier Interactive Patient Education  2017 [employer].  Fall Prevention in the Home Falls can cause injuries. They can happen to people of all ages. There are many things you can do to make your home safe and to help prevent falls. What can I do on the outside of my home? Regularly fix the edges of walkways and driveways and fix any cracks. Remove anything that might make you trip as you walk through a door, such as a raised step or threshold. Trim any bushes or trees on the path to your home. Use bright outdoor lighting. Clear any walking paths of anything that might make someone trip, such as rocks or tools. Regularly check to see if handrails are loose or broken. Make sure that both sides of any steps have handrails. Any raised decks and porches should have guardrails on the edges. Have any leaves, snow, or ice cleared regularly. Use sand or salt on walking paths during winter. Clean up any spills in your garage right away. This includes oil or grease spills. What can I do in the  bathroom? Use night lights. Install grab bars by the toilet and in the tub and shower. Do not use towel bars as grab bars. Use non-skid mats or decals in the tub or shower. If you need to sit down in the shower, use a plastic, non-slip stool. Keep the floor dry. Clean up any water that spills on the floor as soon as it happens. Remove soap buildup in the tub or shower regularly. Attach bath mats securely with double-sided non-slip rug tape. Do not have throw rugs and other things on the floor that can make you trip. What can I do in the bedroom? Use night lights. Make sure that you have a light by your bed that is easy to reach. Do not use any sheets or blankets that are too big for your bed. They should not hang down onto the floor. Have a firm chair that has side arms. You can use this for support while you get dressed. Do not  have throw rugs and other things on the floor that can make you trip. What can I do in the kitchen? Clean up any spills right away. Avoid walking on wet floors. Keep items that you use a lot in easy-to-reach places. If you need to reach something above you, use a strong step stool that has a grab bar. Keep electrical cords out of the way. Do not use floor polish or wax that makes floors slippery. If you must use wax, use non-skid floor wax. Do not have throw rugs and other things on the floor that can make you trip. What can I do with my stairs? Do not leave any items on the stairs. Make sure that there are handrails on both sides of the stairs and use them. Fix handrails that are broken or loose. Make sure that handrails are as long as the stairways. Check any carpeting to make sure that it is firmly attached to the stairs. Fix any carpet that is loose or worn. Avoid having throw rugs at the top or bottom of the stairs. If you do have throw rugs, attach them to the floor with carpet tape. Make sure that you have a light switch at the top of the stairs and the  bottom of the stairs. If you do not have them, ask someone to add them for you. What else can I do to help prevent falls? Wear shoes that: Do not have high heels. Have rubber bottoms. Are comfortable and fit you well. Are closed at the toe. Do not wear sandals. If you use a stepladder: Make sure that it is fully opened. Do not climb a closed stepladder. Make sure that both sides of the stepladder are locked into place. Ask someone to hold it for you, if possible. Clearly mark and make sure that you can see: Any grab bars or handrails. First and last steps. Where the edge of each step is. Use tools that help you move around (mobility aids) if they are needed. These include: Canes. Walkers. Scooters. Crutches. Turn on the lights when you go into a dark area. Replace any light bulbs as soon as they burn out. Set up your furniture so you have a clear path. Avoid moving your furniture around. If any of your floors are uneven, fix them. If there are any pets around you, be aware of where they are. Review your medicines with your doctor. Some medicines can make you feel dizzy. This can increase your chance of falling. Ask your doctor what other things that you can do to help prevent falls. This information is not intended to replace advice given to you by your health care provider. Make sure you discuss any questions you have with your health care provider. Document Released: 04/04/2009 Document Revised: 11/14/2015 Document Reviewed: 07/13/2014 Elsevier Interactive Patient Education  2017 [employer].

## 2022-10-07 NOTE — Progress Notes (Signed)
Subjective:   Priscilla Daniels is a 82 y.o. female who presents for Medicare Annual (Subsequent) preventive examination. I connected with  Priscilla Daniels on 10/07/22 by a audio enabled telemedicine application and verified that I am speaking with the correct person using two identifiers.  Patient Location: Home  Provider Location: Home Office  I discussed the limitations of evaluation and management by telemedicine. The patient expressed understanding and agreed to proceed.  Review of Systems     Cardiac Risk Factors include: advanced age (>74men, >81 women)     Objective:    Today's Vitals   10/07/22 0949  Weight: 128 lb (58.1 kg)  Height: 5\' 7"  (1.702 m)   Body mass index is 20.05 kg/m.     10/07/2022    9:53 AM 10/03/2021   10:45 AM 01/22/2021    3:09 PM 10/02/2020   10:25 AM 10/02/2019   10:06 AM 08/15/2018    2:56 PM 02/08/2015   11:02 AM  Advanced Directives  Does Patient Have a Medical Advance Directive? No Yes No No Yes Yes No  Type of Special educational needs teacher of [place];Living will   Living will;Healthcare Power of Attorney Living will   Does patient want to make changes to medical advance directive?     No - Patient declined No - Patient declined   Copy of Healthcare Power of Attorney in Chart?  No - copy requested   No - copy requested    Would patient like information on creating a medical advance directive? No - Patient declined   Yes (MAU/Ambulatory/Procedural Areas - Information given)   Yes - Educational materials given    Current Medications (verified) Outpatient Encounter Medications as of 10/07/2022  Medication Sig   acetaminophen (TYLENOL) 500 MG tablet Take 500 mg by mouth every 6 (six) hours as needed.   aspirin 81 MG EC tablet Take 81 mg by mouth daily. Swallow whole.   Calcium Carb-Cholecalciferol (CALCIUM 600/VITAMIN D PO) Take by mouth. With 2000U vitamin d , 2 tablets daily   dorzolamide (TRUSOPT) 2 % ophthalmic solution Place 1 drop  into both eyes 2 (two) times daily.   lisinopril-hydrochlorothiazide (ZESTORETIC) 20-12.5 MG tablet TAKE ONE (1) TABLET BY MOUTH EVERY DAY   mirabegron ER (MYRBETRIQ) 25 MG TB24 tablet Take 1 tablet (25 mg total) by mouth daily.   rosuvastatin (CRESTOR) 10 MG tablet TAKE ONE (1) TABLET BY MOUTH EVERY DAY   Travoprost, BAK Free, (TRAVATAN Z) 0.004 % SOLN ophthalmic solution Place 1 drop into both eyes at bedtime.   No facility-administered encounter medications on file as of 10/07/2022.    Allergies (verified) Lipitor [atorvastatin calcium], Septra [bactrim], and Sulfa antibiotics   History: Past Medical History:  Diagnosis Date   Abnormal vaginal Pap smear    Candidiasis    cuteneous    Chronic UTI    COPD (chronic obstructive pulmonary disease) 2010   Dx by CXR    DM (diabetes mellitus)    Fatigue    Glaucoma    Hemorrhage in optic nerve sheath of left eye 01/23/2020   Hyperlipidemia    Diet Controlled    NIDDM (non-insulin dependent diabetes mellitus)    Osteopenia 03/12/2021   Phlebitis    Strep pharyngitis    Vaginal atrophy    Vertigo    Past Surgical History:  Procedure Laterality Date   CERVICAL CONE BIOPSY     GLAUCOMA SURGERY  12/04 & 07/05/03   left eye cataract surgery  01/29/2009   right eye cataract surgery  01/13/09   VARICOSE VEIN SURGERY     both legs    Family History  Problem Relation Age of Onset   Stroke Mother    Cancer Father    Cancer Sister    Diabetes Brother    Hyperlipidemia Brother    Hyperlipidemia Brother    Social History   Socioeconomic History   Marital status: Widowed    Spouse name: Not on file   Number of children: 2   Years of education: 7   Highest education level: 7th grade  Occupational History   Occupation: Retired  Tobacco Use   Smoking status: Never   Smokeless tobacco: Never  Vaping Use   Vaping Use: Never used  Substance and Sexual Activity   Alcohol use: No   Drug use: No   Sexual activity: Not Currently   Other Topics Concern   Not on file  Social History Narrative   Her daughter lives with her; She has 2 dogs she takes care of.   Social Determinants of Health   Financial Resource Strain: Low Risk  (10/07/2022)   Overall Financial Resource Strain (CARDIA)    Difficulty of Paying Living Expenses: Not hard at all  Food Insecurity: No Food Insecurity (10/07/2022)   Hunger Vital Sign    Worried About Running Out of Food in the Last Year: Never true    Ran Out of Food in the Last Year: Never true  Transportation Needs: No Transportation Needs (10/07/2022)   PRAPARE - Administrator, Civil Service (Medical): No    Lack of Transportation (Non-Medical): No  Physical Activity: Insufficiently Active (10/07/2022)   Exercise Vital Sign    Days of Exercise per Week: 3 days    Minutes of Exercise per Session: 30 min  Stress: No Stress Concern Present (10/07/2022)   [employer] of Occupational Health - Occupational Stress Questionnaire    Feeling of Stress : Not at all  Social Connections: Socially Isolated (10/07/2022)   Social Connection and Isolation Panel [NHANES]    Frequency of Communication with Friends and Family: More than three times a week    Frequency of Social Gatherings with Friends and Family: More than three times a week    Attends Religious Services: Never    Database administrator or Organizations: No    Attends Engineer, structural: Never    Marital Status: Divorced    Tobacco Counseling Counseling given: Not Answered   Clinical Intake:  Pre-visit preparation completed: Yes  Pain : No/denies pain     Nutritional Risks: None Diabetes: No  How often do you need to have someone help you when you read instructions, pamphlets, or other written materials from your doctor or pharmacy?: 1 - Never  Diabetic?no   Interpreter Needed?: No  Information entered by :: Renie Ora, LPN   Activities of Daily Living    10/07/2022    9:54 AM  In  your present state of health, do you have any difficulty performing the following activities:  Hearing? 0  Vision? 0  Difficulty concentrating or making decisions? 0  Walking or climbing stairs? 0  Dressing or bathing? 0  Doing errands, shopping? 0  Preparing Food and eating ? N  Using the Toilet? N  In the past six months, have you accidently leaked urine? N  Do you have problems with loss of bowel control? N  Managing your Medications? N  Managing your Finances?  N  Housekeeping or managing your Housekeeping? N    Patient Care Team: Gabriel Earing, FNP as PCP - General (Family Medicine) Luciana Axe Alford Highland, MD as Consulting Physician (Ophthalmology) Smitty Cords, OD (Optometry)  Indicate any recent Medical Services you may have received from other than [facility] providers in the past year (date may be approximate).     Assessment:   This is a routine wellness examination for Cameren.  Hearing/Vision screen Vision Screening - Comments:: Wears rx glasses - up to date with routine eye exams with  Dr.Martin   Dietary issues and exercise activities discussed: Current Exercise Habits: Home exercise routine, Type of exercise: walking, Time (Minutes): 30, Frequency (Times/Week): 3, Weekly Exercise (Minutes/Week): 90, Intensity: Mild, Exercise limited by: orthopedic condition(s)   Goals Addressed             This Visit's Progress    Have 3 meals a day   On track    Goals Addressed             This Visit's Progress    Exercise 150 min/wk Moderate Activity   On track    10/02/2019 AWV Goal: Exercise for General Health  Patient will verbalize understanding of the benefits of increased physical activity: Exercising regularly is important. It will improve your overall fitness, flexibility, and endurance. Regular exercise also will improve your overall health. It can help you control your weight, reduce stress, and improve your bone density. Over the next year, patient will  increase physical activity as tolerated with a goal of at least 150 minutes of moderate physical activity per week.  You can tell that you are exercising at a moderate intensity if your heart starts beating faster and you start breathing faster but can still hold a conversation. Moderate-intensity exercise ideas include: Walking 1 mile (1.6 km) in about 15 minutes Biking Hiking Golfing Dancing Water aerobics Patient will verbalize understanding of everyday activities that increase physical activity by providing examples like the following: Yard work, such as: Insurance underwriter Gardening Washing windows or floors Patient will be able to explain general safety guidelines for exercising:  Before you start a new exercise program, talk with your health care provider. Do not exercise so much that you hurt yourself, feel dizzy, or get very short of breath. Wear comfortable clothes and wear shoes with good support. Drink plenty of water while you exercise to prevent dehydration or heat stroke. Work out until your breathing and your heartbeat get faster.      Have 3 meals a day   On track    10/02/2019 AWV Goal: Improved Nutrition/Diet  Patient will verbalize understanding that diet plays an important role in overall health and that a poor diet is a risk factor for many chronic medical conditions.  Over the next year, patient will improve self management of their diet by incorporating better variety, improved meal pattern, more consistent meal timing, increased physical activity, improved protein intake, and better food choices. Patient will utilize available community resources to help with food acquisition if needed (ex: food pantries, [address], etc) Patient will work with nutrition specialist if a referral was made               Depression Screen    10/07/2022    9:52 AM 07/01/2022   11:15 AM 12/25/2021    11:16 AM 10/03/2021   10:35 AM 09/24/2021    1:29  PM 08/20/2021   10:30 AM 05/21/2021    3:28 PM  PHQ 2/9 Scores  PHQ - 2 Score 0 0 3 6 6 3 3   PHQ- 9 Score  0 11 16 16 15 12     Fall Risk    10/07/2022    9:51 AM 07/01/2022   11:15 AM 12/25/2021   11:16 AM 10/03/2021   10:38 AM 09/24/2021    1:29 PM  Fall Risk   Falls in the past year? 0 0 0 1 1  Number falls in past yr: 0   0 0  Injury with Fall? 0   0 0  Risk for fall due to : No Fall Risks   History of fall(s);Impaired balance/gait;Orthopedic patient   Follow up Falls prevention discussed   Education provided;Falls prevention discussed Falls prevention discussed    FALL RISK PREVENTION PERTAINING TO THE HOME:  Any stairs in or around the home? No  If so, are there any without handrails? No  Home free of loose throw rugs in walkways, pet beds, electrical cords, etc? Yes  Adequate lighting in your home to reduce risk of falls? Yes   ASSISTIVE DEVICES UTILIZED TO PREVENT FALLS:  Life alert? No  Use of a cane, walker or w/c? Yes  Grab bars in the bathroom? Yes  Shower chair or bench in shower? Yes  Elevated toilet seat or a handicapped toilet? No       08/15/2018    3:00 PM  MMSE - Mini Mental State Exam  Orientation to time 5  Orientation to Place 5  Registration 3  Attention/ Calculation 3  Recall 2  Language- name 2 objects 2  Language- repeat 1  Language- follow 3 step command 3  Language- read & follow direction 1  Write a sentence 1  Copy design 1  Total score 27        10/07/2022    9:54 AM 10/03/2021   10:46 AM 10/02/2019   10:07 AM  6CIT Screen  What Year? 0 points 0 points 0 points  What month? 0 points 0 points 0 points  What time? 0 points 0 points 0 points  Count back from 20 0 points 0 points 0 points  Months in reverse 0 points 0 points 4 points  Repeat phrase 0 points 0 points 0 points  Total Score 0 points 0 points 4 points    Immunizations Immunization History  Administered Date(s)  Administered   Fluad Quad(high Dose 65+) 03/15/2019, 04/02/2020, 03/12/2021, 04/22/2022   Influenza, High Dose Seasonal PF 04/17/2016, 03/31/2018   Influenza,inj,Quad PF,6+ Mos 03/27/2013, 04/05/2014, 04/19/2015, 03/22/2017   Moderna Sars-Covid-2 Vaccination 09/13/2019, 10/11/2019, 05/01/2020    TDAP status: Due, Education has been provided regarding the importance of this vaccine. Advised may receive this vaccine at local pharmacy or Health Dept. Aware to provide a copy of the vaccination record if obtained from local pharmacy or Health Dept. Verbalized acceptance and understanding.  Flu Vaccine status: Up to date  Pneumococcal vaccine status: Due, Education has been provided regarding the importance of this vaccine. Advised may receive this vaccine at local pharmacy or Health Dept. Aware to provide a copy of the vaccination record if obtained from local pharmacy or Health Dept. Verbalized acceptance and understanding.  Covid-19 vaccine status: Completed vaccines  Qualifies for Shingles Vaccine? Yes   Zostavax completed No   Shingrix Completed?: No.    Education has been provided regarding the importance of this vaccine. Patient has been advised to call  insurance company to determine out of pocket expense if they have not yet received this vaccine. Advised may also receive vaccine at local pharmacy or Health Dept. Verbalized acceptance and understanding.  Screening Tests Health Maintenance  Topic Date Due   DTaP/Tdap/Td (1 - Tdap) Never done   Pneumonia Vaccine 89+ Years old (1 of 1 - PCV) 07/02/2023 (Originally 10/25/2005)   COVID-19 Vaccine (4 - 2023-24 season) 07/18/2023 (Originally 02/20/2022)   Zoster Vaccines- Shingrix (1 of 2) 09/30/2023 (Originally 10/26/1959)   INFLUENZA VACCINE  01/21/2023   Medicare Annual Wellness (AWV)  10/07/2023   DEXA SCAN  03/12/2024   HPV VACCINES  Aged Out    Health Maintenance  Health Maintenance Due  Topic Date Due   DTaP/Tdap/Td (1 - Tdap) Never  done    Colorectal cancer screening: No longer required.   Mammogram status: No longer required due to age .  Bone Density status: Completed 03/12/2021. Results reflect: Bone density results: OSTEOPOROSIS. Repeat every 3 years.  Lung Cancer Screening: (Low Dose CT Chest recommended if Age 31-80 years, 30 pack-year currently smoking OR have quit w/in 15years.) does not qualify.   Lung Cancer Screening Referral: n/a  Additional Screening:  Hepatitis C Screening: does not qualify;   Vision Screening: Recommended annual ophthalmology exams for early detection of glaucoma and other disorders of the eye. Is the patient up to date with their annual eye exam?  Yes  Who is the provider or what is the name of the office in which the patient attends annual eye exams? Dr.Martin  If pt is not established with a provider, would they like to be referred to a provider to establish care? No .   Dental Screening: Recommended annual dental exams for proper oral hygiene  Community Resource Referral / Chronic Care Management: CRR required this visit?  No   CCM required this visit?  No      Plan:     I have personally reviewed and noted the following in the patient's chart:   Medical and social history Use of alcohol, tobacco or illicit drugs  Current medications and supplements including opioid prescriptions. Patient is not currently taking opioid prescriptions. Functional ability and status Nutritional status Physical activity Advanced directives List of other physicians Hospitalizations, surgeries, and ER visits in previous 12 months Vitals Screenings to include cognitive, depression, and falls Referrals and appointments  In addition, I have reviewed and discussed with patient certain preventive protocols, quality metrics, and best practice recommendations. A written personalized care plan for preventive services as well as general preventive health recommendations were provided to  patient.     Lorrene Reid, LPN   1/61/0960   Nurse Notes: Due TDAP Yevonne Aline Vaccine

## 2022-11-11 ENCOUNTER — Other Ambulatory Visit: Payer: Medicare Other

## 2022-11-11 ENCOUNTER — Other Ambulatory Visit: Payer: Self-pay

## 2022-11-11 DIAGNOSIS — R300 Dysuria: Secondary | ICD-10-CM | POA: Diagnosis not present

## 2022-11-12 ENCOUNTER — Encounter: Payer: Self-pay | Admitting: Family Medicine

## 2022-11-12 ENCOUNTER — Telehealth (INDEPENDENT_AMBULATORY_CARE_PROVIDER_SITE_OTHER): Payer: Medicare Other | Admitting: Family Medicine

## 2022-11-12 DIAGNOSIS — N3001 Acute cystitis with hematuria: Secondary | ICD-10-CM

## 2022-11-12 DIAGNOSIS — R300 Dysuria: Secondary | ICD-10-CM | POA: Diagnosis not present

## 2022-11-12 LAB — URINALYSIS, ROUTINE W REFLEX MICROSCOPIC
Bilirubin, UA: NEGATIVE
Glucose, UA: NEGATIVE
Ketones, UA: NEGATIVE
Nitrite, UA: POSITIVE — AB
Protein,UA: NEGATIVE
Specific Gravity, UA: 1.01 (ref 1.005–1.030)
Urobilinogen, Ur: 0.2 mg/dL (ref 0.2–1.0)
pH, UA: 7 (ref 5.0–7.5)

## 2022-11-12 MED ORDER — CIPROFLOXACIN HCL 500 MG PO TABS: 500.0000 mg | ORAL_TABLET | Freq: Two times a day (BID) | ORAL | 0 refills | Status: AC

## 2022-11-12 NOTE — Progress Notes (Signed)
Virtual Visit via Video   I connected with patient on 11/12/22 at 1230 by a video enabled telemedicine application and verified that I am speaking with the correct person using two identifiers.  Location patient: Home Location provider: [facility] Persons participating in the virtual visit: Patient and Provider  I discussed the limitations of evaluation and management by telemedicine and the availability of in person appointments. The patient expressed understanding and agreed to proceed.  Subjective:   HPI:  Pt presents today for  Chief Complaint  Patient presents with   Dysuria   Dysuria  Episode onset: 2 weeks ago. The problem occurs every urination. The problem has been gradually worsening. The quality of the pain is described as aching and burning. The pain is moderate. There has been no fever. She is Not sexually active. There is No history of pyelonephritis. Associated symptoms include frequency and urgency. Pertinent negatives include no chills, discharge, flank pain, hematuria, hesitancy, nausea, possible pregnancy, sweats or vomiting. She has tried increased fluids for the symptoms. The treatment provided no relief. Her past medical history is significant for recurrent UTIs (recently treated with augmentin).     Review of Systems  Constitutional:  Negative for chills, diaphoresis, fever, malaise/fatigue and weight loss.  Gastrointestinal:  Negative for abdominal pain, blood in stool, constipation, diarrhea, heartburn, melena, nausea and vomiting.  Genitourinary:  Positive for dysuria, frequency and urgency. Negative for flank pain, hematuria and hesitancy.  Neurological:  Negative for weakness.  All other systems reviewed and are negative.    Patient Active Problem List   Diagnosis Date Noted   Recurrent UTI 07/01/2022   Depression, recurrent (HCC) 07/01/2022   Chronic right-sided low back pain with right-sided sciatica 05/26/2021    Neural foraminal stenosis of cervical spine 05/26/2021   Urge incontinence of urine 05/26/2021   Varicose veins of both lower extremities with pain 05/26/2021   Prediabetes 03/12/2021   Vitamin D insufficiency 03/12/2021   Arthritis 03/12/2021   Osteopenia 03/12/2021   Posterior vitreous detachment, both eyes 07/30/2020   Central retinal vein occlusion of left eye 01/23/2020   Intermediate stage nonexudative age-related macular degeneration of both eyes 01/23/2020   Primary open angle glaucoma of both eyes, moderate stage 01/23/2020   Retinal hemorrhage of left eye 01/23/2020   CKD (chronic kidney disease), stage III (HCC) 02/11/2015   Atherosclerosis of aorta (HCC) 02/08/2015   Glaucoma (increased eye pressure) 09/30/2012   Hyperlipidemia with target LDL less than 100 09/30/2012   Primary hypertension 09/30/2012    Social History   Tobacco Use   Smoking status: Never   Smokeless tobacco: Never  Substance Use Topics   Alcohol use: No    Current Outpatient Medications:    ciprofloxacin (CIPRO) 500 MG tablet, Take 1 tablet (500 mg total) by mouth 2 (two) times daily for 5 days., Disp: 10 tablet, Rfl: 0   acetaminophen (TYLENOL) 500 MG tablet, Take 500 mg by mouth every 6 (six) hours as needed., Disp: , Rfl:    aspirin 81 MG EC tablet, Take 81 mg by mouth daily. Swallow whole., Disp: , Rfl:    Calcium Carb-Cholecalciferol (CALCIUM 600/VITAMIN D PO), Take by mouth. With 2000U vitamin d , 2 tablets daily, Disp: , Rfl:    dorzolamide (TRUSOPT) 2 % ophthalmic solution, Place 1 drop into both eyes 2 (two) times daily., Disp: 10 mL, Rfl: 12   lisinopril-hydrochlorothiazide (ZESTORETIC) 20-12.5 MG tablet, TAKE ONE (1) TABLET BY MOUTH EVERY DAY, Disp: 90  tablet, Rfl: 1   mirabegron ER (MYRBETRIQ) 25 MG TB24 tablet, Take 1 tablet (25 mg total) by mouth daily., Disp: 90 tablet, Rfl: 1   rosuvastatin (CRESTOR) 10 MG tablet, TAKE ONE (1) TABLET BY MOUTH EVERY DAY, Disp: 90 tablet, Rfl: 1    Travoprost, BAK Free, (TRAVATAN Z) 0.004 % SOLN ophthalmic solution, Place 1 drop into both eyes at bedtime., Disp: 2.5 mL, Rfl: 11  Allergies  Allergen Reactions   Lipitor [Atorvastatin Calcium]    Septra [Bactrim]    Sulfa Antibiotics     Objective:   There were no vitals taken for this visit.  Patient is well-developed, well-nourished in no acute distress.  Resting comfortably at home.  Head is normocephalic, atraumatic.  No labored breathing.  Speech is clear and coherent with logical content.  Patient is alert and oriented at baseline.   Urinalysis in office revealed trace blood, 3+ leukocytes, and positive nitrites. Culture is pending.   Assessment and Plan:   Ying was seen today for dysuria.  Diagnoses and all orders for this visit:  Acute cystitis with hematuria Last urine culture reviewed. Recently treated with Augmentin for acute cystitis. Will treat with below due to recurrent nature of UTIs and prior culture results. Increase water intake and avoid bladder stimulants. Report new, worsening, or persistent symptoms.  -     ciprofloxacin (CIPRO) 500 MG tablet; Take 1 tablet (500 mg total) by mouth 2 (two) times daily for 5 days.      Return if symptoms worsen or fail to improve.  Kari Baars, FNP-C [facility] [address] [place], [place] [address] [phone]  11/12/2022  Time spent with the patient: 15 minutes, of which >50% was spent in obtaining information about symptoms, reviewing previous labs, evaluations, and treatments, counseling about condition (please see the discussed topics above), and developing a plan to further investigate it; had a number of questions which I addressed.

## 2022-11-15 LAB — URINE CULTURE

## 2022-11-18 ENCOUNTER — Telehealth: Payer: Self-pay | Admitting: Family Medicine

## 2022-11-18 NOTE — Telephone Encounter (Signed)
Pt was prescribed Cipro for UTI and has finished the medication but is now itching all over. Started at her hands and now its everywhere. Needs advise.

## 2022-11-19 ENCOUNTER — Telehealth (INDEPENDENT_AMBULATORY_CARE_PROVIDER_SITE_OTHER): Payer: Medicare Other | Admitting: Family

## 2022-11-19 ENCOUNTER — Encounter: Payer: Self-pay | Admitting: Family

## 2022-11-19 ENCOUNTER — Other Ambulatory Visit: Payer: Self-pay | Admitting: Family Medicine

## 2022-11-19 DIAGNOSIS — I700 Atherosclerosis of aorta: Secondary | ICD-10-CM

## 2022-11-19 DIAGNOSIS — Z87440 Personal history of urinary (tract) infections: Secondary | ICD-10-CM

## 2022-11-19 DIAGNOSIS — L299 Pruritus, unspecified: Secondary | ICD-10-CM | POA: Diagnosis not present

## 2022-11-19 DIAGNOSIS — E785 Hyperlipidemia, unspecified: Secondary | ICD-10-CM

## 2022-11-19 DIAGNOSIS — R21 Rash and other nonspecific skin eruption: Secondary | ICD-10-CM

## 2022-11-19 MED ORDER — TRIAMCINOLONE ACETONIDE 0.025 % EX OINT: 1.0000 | TOPICAL_OINTMENT | Freq: Two times a day (BID) | CUTANEOUS | 1 refills | Status: DC

## 2022-11-19 NOTE — Telephone Encounter (Signed)
Called and spoke with daughter who states that patient is still having itching all over even with benadryl. Patient is also having difficulty walking since this UTI has started. Scheduled for a virtual visit today

## 2022-11-19 NOTE — Progress Notes (Signed)
Virtual Visit Consent   [facility] CADD, you are scheduled for a virtual visit with a [facility] provider today. Just as with appointments in the office, your consent must be obtained to participate. Your consent will be active for this visit and any virtual visit you may have with one of our providers in the next 365 days. If you have a MyChart account, a copy of this consent can be sent to you electronically.  As this is a virtual visit, video technology does not allow for your provider to perform a traditional examination. This may limit your provider's ability to fully assess your condition. If your provider identifies any concerns that need to be evaluated in person or the need to arrange testing (such as labs, EKG, etc.), we will make arrangements to do so. Although advances in technology are sophisticated, we cannot ensure that it will always work on either your end or our end. If the connection with a video visit is poor, the visit may have to be switched to a telephone visit. With either a video or telephone visit, we are not always able to ensure that we have a secure connection.  By engaging in this virtual visit, you consent to the provision of healthcare and authorize for your insurance to be billed (if applicable) for the services provided during this visit. Depending on your insurance coverage, you may receive a charge related to this service.  I need to obtain your verbal consent now. Are you willing to proceed with your visit today? Priscilla Daniels has provided verbal consent on 11/19/2022 for a virtual visit (video or telephone). Jannifer Rodney, FNP  Date: 11/19/2022 2:31 PM  Virtual Visit via Video Note   I, Jannifer Rodney, connected with  Priscilla Daniels  ([id number][birth year]) on 11/19/22 at  5:45 PM EDT by a video-enabled telemedicine application and verified that I am speaking with the correct person using two identifiers.  Location: Patient: Virtual Visit Location Patient:  Home Provider: Virtual Visit Location Provider: Office/Clinic   I discussed the limitations of evaluation and management by telemedicine and the availability of in person appointments. The patient expressed understanding and agreed to proceed.    History of Present Illness: Priscilla Daniels is a 82 y.o. who identifies as a female who was assigned female at birth, and is being seen today for rash that started this morning, but has been itching for a week. She had a UTI and was given Cipro. She completed the cipro two days ago. She has been taking Benadryl with mild relief.   HPI: Rash This is a new problem. The current episode started today. The problem is unchanged. The affected locations include the face and chest. The rash is characterized by itchiness and redness. Past treatments include antihistamine and anti-itch cream. The treatment provided mild relief.    Problems:  Patient Active Problem List   Diagnosis Date Noted   Recurrent UTI 07/01/2022   Depression, recurrent (HCC) 07/01/2022   Chronic right-sided low back pain with right-sided sciatica 05/26/2021   Neural foraminal stenosis of cervical spine 05/26/2021   Urge incontinence of urine 05/26/2021   Varicose veins of both lower extremities with pain 05/26/2021   Prediabetes 03/12/2021   Vitamin D insufficiency 03/12/2021   Arthritis 03/12/2021   Osteopenia 03/12/2021   Posterior vitreous detachment, both eyes 07/30/2020   Central retinal vein occlusion of left eye 01/23/2020   Intermediate stage nonexudative age-related macular degeneration of both eyes 01/23/2020   Primary  open angle glaucoma of both eyes, moderate stage 01/23/2020   Retinal hemorrhage of left eye 01/23/2020   CKD (chronic kidney disease), stage III (HCC) 02/11/2015   Atherosclerosis of aorta (HCC) 02/08/2015   Glaucoma (increased eye pressure) 09/30/2012   Hyperlipidemia with target LDL less than 100 09/30/2012   Primary hypertension 09/30/2012     Allergies:  Allergies  Allergen Reactions   Ciprofloxacin Itching    No rash, just itching.    Lipitor [Atorvastatin Calcium]    Septra [Bactrim]    Sulfa Antibiotics    Medications:  Current Outpatient Medications:    triamcinolone (KENALOG) 0.025 % ointment, Apply 1 Application topically 2 (two) times daily., Disp: 90 g, Rfl: 1   acetaminophen (TYLENOL) 500 MG tablet, Take 500 mg by mouth every 6 (six) hours as needed., Disp: , Rfl:    aspirin 81 MG EC tablet, Take 81 mg by mouth daily. Swallow whole., Disp: , Rfl:    Calcium Carb-Cholecalciferol (CALCIUM 600/VITAMIN D PO), Take by mouth. With 2000U vitamin d , 2 tablets daily, Disp: , Rfl:    dorzolamide (TRUSOPT) 2 % ophthalmic solution, Place 1 drop into both eyes 2 (two) times daily., Disp: 10 mL, Rfl: 12   lisinopril-hydrochlorothiazide (ZESTORETIC) 20-12.5 MG tablet, TAKE ONE (1) TABLET BY MOUTH EVERY DAY, Disp: 90 tablet, Rfl: 1   mirabegron ER (MYRBETRIQ) 25 MG TB24 tablet, Take 1 tablet (25 mg total) by mouth daily., Disp: 90 tablet, Rfl: 1   rosuvastatin (CRESTOR) 10 MG tablet, TAKE ONE (1) TABLET BY MOUTH EVERY DAY, Disp: 90 tablet, Rfl: 1   Travoprost, BAK Free, (TRAVATAN Z) 0.004 % SOLN ophthalmic solution, Place 1 drop into both eyes at bedtime., Disp: 2.5 mL, Rfl: 11  Observations/Objective: Patient is well-developed, well-nourished in no acute distress.  Resting comfortably  at home.  Head is normocephalic, atraumatic.  No labored breathing.  Speech is clear and coherent with logical content.  Patient is alert and oriented at baseline.  Slightly erythemas rash on chest, no hives noted   Assessment and Plan: 1. Rash and nonspecific skin eruption - triamcinolone (KENALOG) 0.025 % ointment; Apply 1 Application topically 2 (two) times daily.  Dispense: 90 g; Refill: 1  2. Pruritus - triamcinolone (KENALOG) 0.025 % ointment; Apply 1 Application topically 2 (two) times daily.  Dispense: 90 g; Refill: 1  3. Hx: UTI  (urinary tract infection)  I placed Cipro on allergy list as requested. Discussed what a true allergy is. Unsure this is  true allergy given rash started today and she completed Cipro two days ago.  Avoid putting Kenalog around eye UTI symptoms resolved Force fluids Continue benadryl  Follow up with PCP   Follow Up Instructions: I discussed the assessment and treatment plan with the patient. The patient was provided an opportunity to ask questions and all were answered. The patient agreed with the plan and demonstrated an understanding of the instructions.  A copy of instructions were sent to the patient via MyChart unless otherwise noted below.     The patient was advised to call back or seek an in-person evaluation if the symptoms worsen or if the condition fails to improve as anticipated.  Time:  I spent 11 minutes with the patient via telehealth technology discussing the above problems/concerns.    Jannifer Rodney, FNP

## 2022-11-22 ENCOUNTER — Inpatient Hospital Stay (HOSPITAL_COMMUNITY)
Admission: EM | Admit: 2022-11-22 | Discharge: 2022-11-26 | DRG: 435 | Disposition: A | Discharge status: Hospice | Payer: Medicare Other | Attending: Internal Medicine | Admitting: Internal Medicine

## 2022-11-22 ENCOUNTER — Encounter (HOSPITAL_COMMUNITY): Payer: Self-pay | Admitting: Radiology

## 2022-11-22 ENCOUNTER — Other Ambulatory Visit: Payer: Self-pay

## 2022-11-22 DIAGNOSIS — I4891 Unspecified atrial fibrillation: Secondary | ICD-10-CM | POA: Diagnosis not present

## 2022-11-22 DIAGNOSIS — F339 Major depressive disorder, recurrent, unspecified: Secondary | ICD-10-CM

## 2022-11-22 DIAGNOSIS — I2489 Other forms of acute ischemic heart disease: Secondary | ICD-10-CM | POA: Diagnosis not present

## 2022-11-22 DIAGNOSIS — R404 Transient alteration of awareness: Secondary | ICD-10-CM | POA: Diagnosis not present

## 2022-11-22 DIAGNOSIS — N183 Chronic kidney disease, stage 3 unspecified: Secondary | ICD-10-CM | POA: Diagnosis not present

## 2022-11-22 DIAGNOSIS — H409 Unspecified glaucoma: Secondary | ICD-10-CM | POA: Diagnosis not present

## 2022-11-22 DIAGNOSIS — G959 Disease of spinal cord, unspecified: Secondary | ICD-10-CM

## 2022-11-22 DIAGNOSIS — Z83438 Family history of other disorder of lipoprotein metabolism and other lipidemia: Secondary | ICD-10-CM

## 2022-11-22 DIAGNOSIS — I10 Essential (primary) hypertension: Secondary | ICD-10-CM | POA: Diagnosis not present

## 2022-11-22 DIAGNOSIS — L299 Pruritus, unspecified: Secondary | ICD-10-CM | POA: Diagnosis present

## 2022-11-22 DIAGNOSIS — K831 Obstruction of bile duct: Secondary | ICD-10-CM | POA: Diagnosis not present

## 2022-11-22 DIAGNOSIS — R7303 Prediabetes: Secondary | ICD-10-CM | POA: Diagnosis present

## 2022-11-22 DIAGNOSIS — R932 Abnormal findings on diagnostic imaging of liver and biliary tract: Secondary | ICD-10-CM | POA: Diagnosis not present

## 2022-11-22 DIAGNOSIS — H348122 Central retinal vein occlusion, left eye, stable: Secondary | ICD-10-CM

## 2022-11-22 DIAGNOSIS — Z7401 Bed confinement status: Secondary | ICD-10-CM | POA: Diagnosis not present

## 2022-11-22 DIAGNOSIS — I499 Cardiac arrhythmia, unspecified: Secondary | ICD-10-CM | POA: Diagnosis not present

## 2022-11-22 DIAGNOSIS — R7989 Other specified abnormal findings of blood chemistry: Secondary | ICD-10-CM | POA: Diagnosis not present

## 2022-11-22 DIAGNOSIS — K838 Other specified diseases of biliary tract: Secondary | ICD-10-CM | POA: Diagnosis not present

## 2022-11-22 DIAGNOSIS — D509 Iron deficiency anemia, unspecified: Secondary | ICD-10-CM | POA: Diagnosis present

## 2022-11-22 DIAGNOSIS — J449 Chronic obstructive pulmonary disease, unspecified: Secondary | ICD-10-CM | POA: Diagnosis not present

## 2022-11-22 DIAGNOSIS — E1122 Type 2 diabetes mellitus with diabetic chronic kidney disease: Secondary | ICD-10-CM | POA: Diagnosis not present

## 2022-11-22 DIAGNOSIS — N179 Acute kidney failure, unspecified: Secondary | ICD-10-CM | POA: Diagnosis not present

## 2022-11-22 DIAGNOSIS — R1010 Upper abdominal pain, unspecified: Secondary | ICD-10-CM | POA: Diagnosis not present

## 2022-11-22 DIAGNOSIS — R9431 Abnormal electrocardiogram [ECG] [EKG]: Secondary | ICD-10-CM | POA: Diagnosis present

## 2022-11-22 DIAGNOSIS — R410 Disorientation, unspecified: Secondary | ICD-10-CM | POA: Diagnosis not present

## 2022-11-22 DIAGNOSIS — C250 Malignant neoplasm of head of pancreas: Principal | ICD-10-CM | POA: Diagnosis present

## 2022-11-22 DIAGNOSIS — E876 Hypokalemia: Secondary | ICD-10-CM | POA: Diagnosis not present

## 2022-11-22 DIAGNOSIS — R55 Syncope and collapse: Secondary | ICD-10-CM | POA: Diagnosis present

## 2022-11-22 DIAGNOSIS — Z79899 Other long term (current) drug therapy: Secondary | ICD-10-CM | POA: Diagnosis not present

## 2022-11-22 DIAGNOSIS — E785 Hyperlipidemia, unspecified: Secondary | ICD-10-CM | POA: Diagnosis present

## 2022-11-22 DIAGNOSIS — Z7982 Long term (current) use of aspirin: Secondary | ICD-10-CM

## 2022-11-22 DIAGNOSIS — E871 Hypo-osmolality and hyponatremia: Secondary | ICD-10-CM | POA: Diagnosis present

## 2022-11-22 DIAGNOSIS — T502X5A Adverse effect of carbonic-anhydrase inhibitors, benzothiadiazides and other diuretics, initial encounter: Secondary | ICD-10-CM | POA: Diagnosis present

## 2022-11-22 DIAGNOSIS — I491 Atrial premature depolarization: Secondary | ICD-10-CM | POA: Diagnosis not present

## 2022-11-22 DIAGNOSIS — Z66 Do not resuscitate: Secondary | ICD-10-CM | POA: Diagnosis not present

## 2022-11-22 DIAGNOSIS — N281 Cyst of kidney, acquired: Secondary | ICD-10-CM | POA: Diagnosis not present

## 2022-11-22 DIAGNOSIS — B179 Acute viral hepatitis, unspecified: Secondary | ICD-10-CM | POA: Diagnosis present

## 2022-11-22 DIAGNOSIS — E861 Hypovolemia: Secondary | ICD-10-CM | POA: Diagnosis not present

## 2022-11-22 DIAGNOSIS — Z809 Family history of malignant neoplasm, unspecified: Secondary | ICD-10-CM

## 2022-11-22 DIAGNOSIS — R634 Abnormal weight loss: Secondary | ICD-10-CM | POA: Diagnosis not present

## 2022-11-22 DIAGNOSIS — R935 Abnormal findings on diagnostic imaging of other abdominal regions, including retroperitoneum: Secondary | ICD-10-CM | POA: Diagnosis not present

## 2022-11-22 DIAGNOSIS — E860 Dehydration: Secondary | ICD-10-CM | POA: Diagnosis present

## 2022-11-22 DIAGNOSIS — R748 Abnormal levels of other serum enzymes: Secondary | ICD-10-CM | POA: Diagnosis not present

## 2022-11-22 DIAGNOSIS — Z881 Allergy status to other antibiotic agents status: Secondary | ICD-10-CM

## 2022-11-22 DIAGNOSIS — L8990 Pressure ulcer of unspecified site, unspecified stage: Secondary | ICD-10-CM | POA: Insufficient documentation

## 2022-11-22 DIAGNOSIS — K8689 Other specified diseases of pancreas: Secondary | ICD-10-CM | POA: Diagnosis not present

## 2022-11-22 DIAGNOSIS — L298 Other pruritus: Secondary | ICD-10-CM | POA: Diagnosis present

## 2022-11-22 DIAGNOSIS — I129 Hypertensive chronic kidney disease with stage 1 through stage 4 chronic kidney disease, or unspecified chronic kidney disease: Secondary | ICD-10-CM | POA: Diagnosis present

## 2022-11-22 DIAGNOSIS — Z7189 Other specified counseling: Secondary | ICD-10-CM | POA: Diagnosis not present

## 2022-11-22 DIAGNOSIS — Z8672 Personal history of thrombophlebitis: Secondary | ICD-10-CM

## 2022-11-22 DIAGNOSIS — I700 Atherosclerosis of aorta: Secondary | ICD-10-CM | POA: Diagnosis present

## 2022-11-22 DIAGNOSIS — Z888 Allergy status to other drugs, medicaments and biological substances status: Secondary | ICD-10-CM

## 2022-11-22 DIAGNOSIS — Z833 Family history of diabetes mellitus: Secondary | ICD-10-CM

## 2022-11-22 DIAGNOSIS — Z882 Allergy status to sulfonamides status: Secondary | ICD-10-CM

## 2022-11-22 DIAGNOSIS — Z743 Need for continuous supervision: Secondary | ICD-10-CM | POA: Diagnosis not present

## 2022-11-22 DIAGNOSIS — Z823 Family history of stroke: Secondary | ICD-10-CM

## 2022-11-22 DIAGNOSIS — R6889 Other general symptoms and signs: Secondary | ICD-10-CM | POA: Diagnosis not present

## 2022-11-22 LAB — PROTIME-INR
INR: 1.5 — ABNORMAL HIGH (ref 0.8–1.2)
Prothrombin Time: 18.4 seconds — ABNORMAL HIGH (ref 11.4–15.2)

## 2022-11-22 LAB — CBC
HCT: 30.1 % — ABNORMAL LOW (ref 36.0–46.0)
Hemoglobin: 11.2 g/dL — ABNORMAL LOW (ref 12.0–15.0)
MCH: 28.8 pg (ref 26.0–34.0)
MCHC: 37.2 g/dL — ABNORMAL HIGH (ref 30.0–36.0)
MCV: 77.4 fL — ABNORMAL LOW (ref 80.0–100.0)
Platelets: 299 10*3/uL (ref 150–400)
RBC: 3.89 MIL/uL (ref 3.87–5.11)
RDW: 15.5 % (ref 11.5–15.5)
WBC: 10.2 10*3/uL (ref 4.0–10.5)
nRBC: 0 % (ref 0.0–0.2)

## 2022-11-22 LAB — APTT: aPTT: 33 seconds (ref 24–36)

## 2022-11-22 MED ORDER — SODIUM CHLORIDE 0.9 % IV BOLUS
1000.0000 mL | Freq: Once | INTRAVENOUS | Status: AC
  Administered 2022-11-22: 1000 mL via INTRAVENOUS

## 2022-11-22 NOTE — ED Provider Notes (Signed)
[place] EMERGENCY DEPARTMENT AT [facility]  Provider Note  CSN: [id number] Arrival date & time: 11/22/22 2308  History No chief complaint on file.   Priscilla Daniels is a 82 y.o. female brought by EMS for evaluation of syncope. Daughter at bedside supplements history. Patient was at home, daughter helped her to her bedside commode and a few minutes later noticed her head drooping and she was not responding. Daughter made sure she did not fall off the commode and called EMS who arrived about 5-10 minutes later. Patient reports she remembers waking up in the ambulance. She was recently treated for UTI via a telehealth visit with Cipro. She finished 2-3 days ago. Has not had any CP, SOB, fevers. No nausea, vomiting or diarrhea. She is feeling better now.   Daughter reports the Cipro made her itchy. Told by PCP to take benadryl.    Home Medications Prior to Admission medications   Medication Sig Start Date End Date Taking? Authorizing Provider  acetaminophen (TYLENOL) 500 MG tablet Take 500 mg by mouth every 6 (six) hours as needed.    [provider]  aspirin 81 MG EC tablet Take 81 mg by mouth daily. Swallow whole.    [provider]  Calcium Carb-Cholecalciferol (CALCIUM 600/VITAMIN D PO) Take by mouth. With 2000U vitamin d , 2 tablets daily    [provider]  dorzolamide (TRUSOPT) 2 % ophthalmic solution Place 1 drop into both eyes 2 (two) times daily. 01/29/22   Rankin, Alford Highland, MD  lisinopril-hydrochlorothiazide (ZESTORETIC) 20-12.5 MG tablet TAKE ONE (1) TABLET BY MOUTH EVERY DAY 07/02/22   Gabriel Earing, FNP  mirabegron ER (MYRBETRIQ) 25 MG TB24 tablet Take 1 tablet (25 mg total) by mouth daily. 07/02/22   Gabriel Earing, FNP  rosuvastatin (CRESTOR) 10 MG tablet TAKE ONE (1) TABLET BY MOUTH EVERY DAY 11/19/22   Gabriel Earing, FNP  Travoprost, BAK Free, (TRAVATAN Z) 0.004 % SOLN ophthalmic solution Place 1 drop into both eyes at bedtime.  01/29/22 01/30/23  Rankin, Alford Highland, MD  triamcinolone (KENALOG) 0.025 % ointment Apply 1 Application topically 2 (two) times daily. 11/19/22   Junie Spencer, FNP     Allergies    Ciprofloxacin, Lipitor [atorvastatin calcium], Septra [bactrim], and Sulfa antibiotics   Review of Systems   Review of Systems Please see HPI for pertinent positives and negatives  Physical Exam BP (!) 70/49 (BP Location: Left Arm)   Pulse 87   Temp 97.8 F (36.6 C) (Oral)   Resp 17   Ht 5\' 7"  (1.702 m)   Wt 58.1 kg   SpO2 100%   BMI 20.05 kg/m   Physical Exam Vitals and nursing note reviewed.  Constitutional:      Appearance: Normal appearance.  HENT:     Head: Normocephalic and atraumatic.     Nose: Nose normal.     Mouth/Throat:     Mouth: Mucous membranes are dry.  Eyes:     Extraocular Movements: Extraocular movements intact.     Conjunctiva/sclera: Conjunctivae normal.  Cardiovascular:     Rate and Rhythm: Normal rate.  Pulmonary:     Effort: Pulmonary effort is normal.     Breath sounds: Normal breath sounds.  Abdominal:     General: Abdomen is flat.     Palpations: Abdomen is soft.     Tenderness: There is no abdominal tenderness. There is no guarding.  Musculoskeletal:        General:  No swelling. Normal range of motion.     Cervical back: Neck supple.  Skin:    General: Skin is warm and dry.     Coloration: Skin is jaundiced.  Neurological:     General: No focal deficit present.     Mental Status: She is alert and oriented to person, place, and time.     Cranial Nerves: No cranial nerve deficit.     Sensory: No sensory deficit.     Motor: No weakness.  Psychiatric:        Mood and Affect: Mood normal.     ED Results / Procedures / Treatments   EKG EKG Interpretation  Date/Time:  Sunday November 22 2022 23:23:19 EDT Ventricular Rate:  84 PR Interval:  132 QRS Duration: 92 QT Interval:  475 QTC Calculation: 562 R Axis:   48 Text Interpretation: Sinus rhythm Atrial  premature complexes Probable anterior infarct, age indeterminate Prolonged QT interval Since last tracing QT has lengthened Confirmed by Jafet Wissing ([id number]) on 11/22/2022 11:39:26 PM  Procedures Procedures  Medications Ordered in the ED Medications  sodium chloride 0.9 % bolus 1,000 mL (has no administration in time range)    Initial Impression and Plan  Patient here after an episode of unresponsiveness lasting for several minutes. No falls or trauma. She is back to baseline now, no focal neuro findings. She was noted to be hypotensive on arrival, monitor appears to show afib, but EKG has sinus rhythm with PACs, low voltage probably accounts for monitor appearance. She appears to be jaundiced, no history of same. Will check labs, begin IVF and reassess.   ED Course       MDM Rules/Calculators/A&P Medical Decision Making    Final Clinical Impression(s) / ED Diagnoses Final diagnoses:  None    Rx / DC Orders ED Discharge Orders          Ordered    Amb referral to [facility]        06 /02/24 2332

## 2022-11-23 ENCOUNTER — Inpatient Hospital Stay (HOSPITAL_COMMUNITY): Payer: Medicare Other

## 2022-11-23 ENCOUNTER — Encounter (HOSPITAL_COMMUNITY): Payer: Self-pay | Admitting: Internal Medicine

## 2022-11-23 ENCOUNTER — Emergency Department (HOSPITAL_COMMUNITY): Payer: Medicare Other

## 2022-11-23 DIAGNOSIS — N179 Acute kidney failure, unspecified: Secondary | ICD-10-CM | POA: Diagnosis present

## 2022-11-23 DIAGNOSIS — K831 Obstruction of bile duct: Secondary | ICD-10-CM | POA: Diagnosis present

## 2022-11-23 DIAGNOSIS — I4891 Unspecified atrial fibrillation: Secondary | ICD-10-CM | POA: Diagnosis present

## 2022-11-23 DIAGNOSIS — J449 Chronic obstructive pulmonary disease, unspecified: Secondary | ICD-10-CM | POA: Diagnosis present

## 2022-11-23 DIAGNOSIS — E1122 Type 2 diabetes mellitus with diabetic chronic kidney disease: Secondary | ICD-10-CM | POA: Diagnosis present

## 2022-11-23 DIAGNOSIS — R9431 Abnormal electrocardiogram [ECG] [EKG]: Secondary | ICD-10-CM | POA: Diagnosis present

## 2022-11-23 DIAGNOSIS — K8689 Other specified diseases of pancreas: Secondary | ICD-10-CM | POA: Diagnosis not present

## 2022-11-23 DIAGNOSIS — E871 Hypo-osmolality and hyponatremia: Secondary | ICD-10-CM | POA: Diagnosis present

## 2022-11-23 DIAGNOSIS — D509 Iron deficiency anemia, unspecified: Secondary | ICD-10-CM | POA: Diagnosis present

## 2022-11-23 DIAGNOSIS — Z7189 Other specified counseling: Secondary | ICD-10-CM | POA: Diagnosis not present

## 2022-11-23 DIAGNOSIS — I2489 Other forms of acute ischemic heart disease: Secondary | ICD-10-CM | POA: Diagnosis present

## 2022-11-23 DIAGNOSIS — C250 Malignant neoplasm of head of pancreas: Secondary | ICD-10-CM | POA: Diagnosis present

## 2022-11-23 DIAGNOSIS — R7989 Other specified abnormal findings of blood chemistry: Secondary | ICD-10-CM | POA: Diagnosis present

## 2022-11-23 DIAGNOSIS — E785 Hyperlipidemia, unspecified: Secondary | ICD-10-CM | POA: Diagnosis present

## 2022-11-23 DIAGNOSIS — R55 Syncope and collapse: Secondary | ICD-10-CM | POA: Diagnosis present

## 2022-11-23 DIAGNOSIS — B179 Acute viral hepatitis, unspecified: Secondary | ICD-10-CM | POA: Diagnosis present

## 2022-11-23 DIAGNOSIS — Z66 Do not resuscitate: Secondary | ICD-10-CM | POA: Diagnosis not present

## 2022-11-23 DIAGNOSIS — I491 Atrial premature depolarization: Secondary | ICD-10-CM | POA: Diagnosis not present

## 2022-11-23 DIAGNOSIS — I700 Atherosclerosis of aorta: Secondary | ICD-10-CM | POA: Diagnosis present

## 2022-11-23 DIAGNOSIS — E860 Dehydration: Secondary | ICD-10-CM | POA: Diagnosis present

## 2022-11-23 DIAGNOSIS — E876 Hypokalemia: Secondary | ICD-10-CM | POA: Diagnosis present

## 2022-11-23 DIAGNOSIS — H409 Unspecified glaucoma: Secondary | ICD-10-CM | POA: Diagnosis present

## 2022-11-23 DIAGNOSIS — N183 Chronic kidney disease, stage 3 unspecified: Secondary | ICD-10-CM | POA: Diagnosis present

## 2022-11-23 DIAGNOSIS — Z79899 Other long term (current) drug therapy: Secondary | ICD-10-CM | POA: Diagnosis not present

## 2022-11-23 DIAGNOSIS — I129 Hypertensive chronic kidney disease with stage 1 through stage 4 chronic kidney disease, or unspecified chronic kidney disease: Secondary | ICD-10-CM | POA: Diagnosis present

## 2022-11-23 DIAGNOSIS — E861 Hypovolemia: Secondary | ICD-10-CM | POA: Diagnosis present

## 2022-11-23 DIAGNOSIS — L299 Pruritus, unspecified: Secondary | ICD-10-CM | POA: Diagnosis present

## 2022-11-23 LAB — URINALYSIS, ROUTINE W REFLEX MICROSCOPIC
Bacteria, UA: NONE SEEN
Glucose, UA: NEGATIVE mg/dL
Ketones, ur: NEGATIVE mg/dL
Leukocytes,Ua: NEGATIVE
Nitrite: NEGATIVE
Protein, ur: 100 mg/dL — AB
Specific Gravity, Urine: 1.008 (ref 1.005–1.030)
pH: 6 (ref 5.0–8.0)

## 2022-11-23 LAB — TROPONIN I (HIGH SENSITIVITY)
Troponin I (High Sensitivity): 40 ng/L — ABNORMAL HIGH (ref <18)
Troponin I (High Sensitivity): 48 ng/L — ABNORMAL HIGH (ref <18)

## 2022-11-23 LAB — COMPREHENSIVE METABOLIC PANEL
ALT: 119 U/L — ABNORMAL HIGH (ref 0–44)
AST: 299 U/L — ABNORMAL HIGH (ref 15–41)
Albumin: 2.3 g/dL — ABNORMAL LOW (ref 3.5–5.0)
Alkaline Phosphatase: 641 U/L — ABNORMAL HIGH (ref 38–126)
Anion gap: 12 (ref 5–15)
BUN: 17 mg/dL (ref 8–23)
CO2: 24 mmol/L (ref 22–32)
Calcium: 8.1 mg/dL — ABNORMAL LOW (ref 8.9–10.3)
Chloride: 90 mmol/L — ABNORMAL LOW (ref 98–111)
Creatinine, Ser: 1.31 mg/dL — ABNORMAL HIGH (ref 0.44–1.00)
GFR, Estimated: 41 mL/min — ABNORMAL LOW (ref >60)
Glucose, Bld: 132 mg/dL — ABNORMAL HIGH (ref 70–99)
Potassium: 2 mmol/L — CL (ref 3.5–5.1)
Sodium: 126 mmol/L — ABNORMAL LOW (ref 135–145)
Total Bilirubin: 9.3 mg/dL — ABNORMAL HIGH (ref 0.3–1.2)
Total Protein: 5.9 g/dL — ABNORMAL LOW (ref 6.5–8.1)

## 2022-11-23 LAB — BASIC METABOLIC PANEL
Anion gap: 4 — ABNORMAL LOW (ref 5–15)
Anion gap: 10 (ref 5–15)
BUN: 15 mg/dL (ref 8–23)
BUN: 15 mg/dL (ref 8–23)
CO2: 21 mmol/L — ABNORMAL LOW (ref 22–32)
CO2: 21 mmol/L — ABNORMAL LOW (ref 22–32)
Calcium: 6.9 mg/dL — ABNORMAL LOW (ref 8.9–10.3)
Calcium: 7.6 mg/dL — ABNORMAL LOW (ref 8.9–10.3)
Chloride: 102 mmol/L (ref 98–111)
Chloride: 99 mmol/L (ref 98–111)
Creatinine, Ser: 1.13 mg/dL — ABNORMAL HIGH (ref 0.44–1.00)
Creatinine, Ser: 1.12 mg/dL — ABNORMAL HIGH (ref 0.44–1.00)
GFR, Estimated: 49 mL/min — ABNORMAL LOW (ref >60)
GFR, Estimated: 49 mL/min — ABNORMAL LOW (ref >60)
Glucose, Bld: 98 mg/dL (ref 70–99)
Glucose, Bld: 145 mg/dL — ABNORMAL HIGH (ref 70–99)
Potassium: 2.5 mmol/L — CL (ref 3.5–5.1)
Potassium: 3.1 mmol/L — ABNORMAL LOW (ref 3.5–5.1)
Sodium: 127 mmol/L — ABNORMAL LOW (ref 135–145)
Sodium: 130 mmol/L — ABNORMAL LOW (ref 135–145)

## 2022-11-23 LAB — HEPATIC FUNCTION PANEL
ALT: 117 U/L — ABNORMAL HIGH (ref 0–44)
AST: 314 U/L — ABNORMAL HIGH (ref 15–41)
Albumin: 2.1 g/dL — ABNORMAL LOW (ref 3.5–5.0)
Alkaline Phosphatase: 574 U/L — ABNORMAL HIGH (ref 38–126)
Bilirubin, Direct: 5 mg/dL — ABNORMAL HIGH (ref 0.0–0.2)
Indirect Bilirubin: 3.3 mg/dL — ABNORMAL HIGH (ref 0.3–0.9)
Total Bilirubin: 8.3 mg/dL — ABNORMAL HIGH (ref 0.3–1.2)
Total Protein: 5.6 g/dL — ABNORMAL LOW (ref 6.5–8.1)

## 2022-11-23 LAB — HEPATITIS PANEL, ACUTE
HCV Ab: NONREACTIVE
Hep A IgM: NONREACTIVE
Hep B C IgM: NONREACTIVE
Hepatitis B Surface Ag: NONREACTIVE

## 2022-11-23 LAB — CBC WITH DIFFERENTIAL/PLATELET
Abs Immature Granulocytes: 0.04 10*3/uL (ref 0.00–0.07)
Basophils Absolute: 0 10*3/uL (ref 0.0–0.1)
Basophils Relative: 0 %
Eosinophils Absolute: 0 10*3/uL (ref 0.0–0.5)
Eosinophils Relative: 0 %
HCT: 28.4 % — ABNORMAL LOW (ref 36.0–46.0)
Hemoglobin: 10.1 g/dL — ABNORMAL LOW (ref 12.0–15.0)
Immature Granulocytes: 0 %
Lymphocytes Relative: 12 %
Lymphs Abs: 1.2 10*3/uL (ref 0.7–4.0)
MCH: 28.3 pg (ref 26.0–34.0)
MCHC: 35.6 g/dL (ref 30.0–36.0)
MCV: 79.6 fL — ABNORMAL LOW (ref 80.0–100.0)
Monocytes Absolute: 0.7 10*3/uL (ref 0.1–1.0)
Monocytes Relative: 6 %
Neutro Abs: 8.5 10*3/uL — ABNORMAL HIGH (ref 1.7–7.7)
Neutrophils Relative %: 82 %
Platelets: 267 10*3/uL (ref 150–400)
RBC: 3.57 MIL/uL — ABNORMAL LOW (ref 3.87–5.11)
RDW: 15.7 % — ABNORMAL HIGH (ref 11.5–15.5)
WBC: 10.4 10*3/uL (ref 4.0–10.5)
nRBC: 0 % (ref 0.0–0.2)

## 2022-11-23 LAB — PHOSPHORUS: Phosphorus: 1.9 mg/dL — ABNORMAL LOW (ref 2.5–4.6)

## 2022-11-23 LAB — LIPASE, BLOOD: Lipase: 87 U/L — ABNORMAL HIGH (ref 11–51)

## 2022-11-23 LAB — MRSA NEXT GEN BY PCR, NASAL: MRSA by PCR Next Gen: NOT DETECTED

## 2022-11-23 LAB — TSH: TSH: 2.133 u[IU]/mL (ref 0.350–4.500)

## 2022-11-23 LAB — MAGNESIUM: Magnesium: 1.6 mg/dL — ABNORMAL LOW (ref 1.7–2.4)

## 2022-11-23 MED ORDER — ONDANSETRON HCL 4 MG PO TABS: 4.0000 mg | ORAL_TABLET | Freq: Four times a day (QID) | ORAL | Status: DC | PRN

## 2022-11-23 MED ORDER — PIPERACILLIN-TAZOBACTAM 3.375 G IVPB 30 MIN: 3.3750 g | Freq: Three times a day (TID) | INTRAVENOUS | Status: DC

## 2022-11-23 MED ORDER — POTASSIUM PHOSPHATES 15 MMOLE/5ML IV SOLN
15.0000 mmol | Freq: Once | INTRAVENOUS | Status: AC
  Administered 2022-11-23: 15 mmol via INTRAVENOUS
  Filled 2022-11-23: qty 5

## 2022-11-23 MED ORDER — POTASSIUM CHLORIDE 10 MEQ/100ML IV SOLN
10.0000 meq | INTRAVENOUS | Status: AC
  Administered 2022-11-23 (×4): 10 meq via INTRAVENOUS
  Filled 2022-11-23 (×3): qty 100

## 2022-11-23 MED ORDER — PIPERACILLIN-TAZOBACTAM 3.375 G IVPB
3.3750 g | Freq: Three times a day (TID) | INTRAVENOUS | Status: DC
  Administered 2022-11-23 – 2022-11-24 (×5): 3.375 g via INTRAVENOUS
  Filled 2022-11-23 (×5): qty 50

## 2022-11-23 MED ORDER — PROCHLORPERAZINE EDISYLATE 10 MG/2ML IJ SOLN: 5.0000 mg | INTRAMUSCULAR | Status: DC | PRN

## 2022-11-23 MED ORDER — LATANOPROST 0.005 % OP SOLN
1.0000 [drp] | Freq: Every day | OPHTHALMIC | Status: DC
  Administered 2022-11-23 – 2022-11-25 (×3): 1 [drp] via OPHTHALMIC
  Filled 2022-11-23: qty 2.5

## 2022-11-23 MED ORDER — FENTANYL CITRATE PF 50 MCG/ML IJ SOSY
25.0000 ug | PREFILLED_SYRINGE | Freq: Once | INTRAMUSCULAR | Status: AC
  Administered 2022-11-23: 25 ug via INTRAVENOUS
  Filled 2022-11-23: qty 1

## 2022-11-23 MED ORDER — PANTOPRAZOLE SODIUM 40 MG IV SOLR
40.0000 mg | INTRAVENOUS | Status: DC
  Administered 2022-11-23 – 2022-11-25 (×3): 40 mg via INTRAVENOUS
  Filled 2022-11-23 (×3): qty 10

## 2022-11-23 MED ORDER — POTASSIUM CHLORIDE IN NACL 40-0.9 MEQ/L-% IV SOLN
INTRAVENOUS | Status: DC
  Filled 2022-11-23 (×4): qty 1000

## 2022-11-23 MED ORDER — POTASSIUM CHLORIDE CRYS ER 20 MEQ PO TBCR
40.0000 meq | EXTENDED_RELEASE_TABLET | Freq: Once | ORAL | Status: AC
  Administered 2022-11-23: 40 meq via ORAL
  Filled 2022-11-23: qty 2

## 2022-11-23 MED ORDER — FENTANYL CITRATE PF 50 MCG/ML IJ SOSY: 25.0000 ug | PREFILLED_SYRINGE | INTRAMUSCULAR | Status: DC | PRN

## 2022-11-23 MED ORDER — IOHEXOL 300 MG/ML  SOLN
100.0000 mL | Freq: Once | INTRAMUSCULAR | Status: AC | PRN
  Administered 2022-11-23: 100 mL via INTRAVENOUS

## 2022-11-23 MED ORDER — GADOBUTROL 1 MMOL/ML IV SOLN
5.0000 mL | Freq: Once | INTRAVENOUS | Status: AC | PRN
  Administered 2022-11-23: 5 mL via INTRAVENOUS

## 2022-11-23 MED ORDER — POTASSIUM CHLORIDE CRYS ER 20 MEQ PO TBCR
20.0000 meq | EXTENDED_RELEASE_TABLET | Freq: Once | ORAL | Status: AC
  Administered 2022-11-23: 20 meq via ORAL
  Filled 2022-11-23: qty 1

## 2022-11-23 MED ORDER — PIPERACILLIN-TAZOBACTAM 3.375 G IVPB 30 MIN
3.3750 g | Freq: Once | INTRAVENOUS | Status: AC
  Administered 2022-11-23: 3.375 g via INTRAVENOUS
  Filled 2022-11-23: qty 50

## 2022-11-23 MED ORDER — MAGNESIUM SULFATE 2 GM/50ML IV SOLN
2.0000 g | Freq: Once | INTRAVENOUS | Status: AC
  Administered 2022-11-23: 2 g via INTRAVENOUS
  Filled 2022-11-23: qty 50

## 2022-11-23 MED ORDER — ONDANSETRON HCL 4 MG/2ML IJ SOLN: 4.0000 mg | Freq: Four times a day (QID) | INTRAMUSCULAR | Status: DC | PRN

## 2022-11-23 MED ORDER — POTASSIUM CHLORIDE 10 MEQ/100ML IV SOLN
10.0000 meq | INTRAVENOUS | Status: AC
  Administered 2022-11-23 (×6): 10 meq via INTRAVENOUS
  Filled 2022-11-23 (×6): qty 100

## 2022-11-23 MED ORDER — LISINOPRIL 20 MG PO TABS
20.0000 mg | ORAL_TABLET | Freq: Every day | ORAL | Status: DC
  Administered 2022-11-23 – 2022-11-26 (×4): 20 mg via ORAL
  Filled 2022-11-23 (×2): qty 1
  Filled 2022-11-23 (×2): qty 2

## 2022-11-23 MED ORDER — SODIUM CHLORIDE 0.9% FLUSH
3.0000 mL | Freq: Two times a day (BID) | INTRAVENOUS | Status: DC
  Administered 2022-11-23 – 2022-11-26 (×6): 3 mL via INTRAVENOUS

## 2022-11-23 MED ORDER — CHLORHEXIDINE GLUCONATE CLOTH 2 % EX PADS
6.0000 | MEDICATED_PAD | Freq: Every day | CUTANEOUS | Status: DC
  Administered 2022-11-23: 6 via TOPICAL

## 2022-11-23 MED ORDER — DIPHENHYDRAMINE HCL 25 MG PO CAPS
25.0000 mg | ORAL_CAPSULE | Freq: Four times a day (QID) | ORAL | Status: DC | PRN
  Administered 2022-11-23 – 2022-11-26 (×6): 25 mg via ORAL
  Filled 2022-11-23 (×7): qty 1

## 2022-11-23 MED ORDER — DORZOLAMIDE HCL 2 % OP SOLN
1.0000 [drp] | Freq: Two times a day (BID) | OPHTHALMIC | Status: DC
  Administered 2022-11-23 – 2022-11-26 (×7): 1 [drp] via OPHTHALMIC
  Filled 2022-11-23: qty 10

## 2022-11-23 NOTE — ED Provider Notes (Signed)
Patient transferred here from [facility] for advanced imaging.  Patient had a syncopal episode and prolonged unresponsiveness.  Patient is now awake and alert.  She has evidence of acute hepatitis of unclear etiology. She will obtain CT head and abdomen pelvis, will be admitted   Zadie Rhine, MD 11/23/22 [phone]

## 2022-11-23 NOTE — ED Notes (Signed)
Critical K 2.0. MD made aware.

## 2022-11-23 NOTE — ED Notes (Signed)
[facility] arrived  

## 2022-11-23 NOTE — ED Notes (Signed)
Incontinence pad changed, pericare performed, and barrier cream applied to sacral region. Patient placed in gown and skid proof socks

## 2022-11-23 NOTE — Consult Note (Signed)
Reason for Consult: Obstructive jaundice Referring Physician: Hospital team  Priscilla Daniels is an 82 y.o. female.  HPI: Patient seen and examined in her hospital computer chart reviewed and she has noted dark urine for a week or 2 and is lost 10 to 15 pounds over the last month or 2 and has had some increased abdominal pain mostly upper and will periodically have some diarrhea but she blames coffee or too much sugar for that and her daughter noticed her skin was a little more yellow lately and she will occasionally have some low-grade fevers that Tylenol will help but she has not had any previous GI problems or workup and her family history is negative for any GI issues and it has been years since she has been in the hospital and other than a conization and a tubal ligation she has had no other obvious surgeries and she has no other complaints  Past Medical History:  Diagnosis Date   Abnormal vaginal Pap smear    Candidiasis    cuteneous    Chronic UTI    COPD (chronic obstructive pulmonary disease) (HCC) 2010   Dx by CXR    DM (diabetes mellitus) (HCC)    Fatigue    Glaucoma    Hemorrhage in optic nerve sheath of left eye 01/23/2020   Hyperlipidemia    Diet Controlled    NIDDM (non-insulin dependent diabetes mellitus)    Osteopenia 03/12/2021   Phlebitis    Strep pharyngitis    Vaginal atrophy    Vertigo     Past Surgical History:  Procedure Laterality Date   CERVICAL CONE BIOPSY     GLAUCOMA SURGERY  12/04 & 07/05/03   left eye cataract surgery  01/29/2009   right eye cataract surgery  01/13/09   VARICOSE VEIN SURGERY     both legs     Family History  Problem Relation Age of Onset   Stroke Mother    Cancer Father    Cancer Sister    Diabetes Brother    Hyperlipidemia Brother    Hyperlipidemia Brother     Social History:  reports that she has never smoked. She has never used smokeless tobacco. She reports that she does not drink alcohol and does not use  drugs.  Allergies:  Allergies  Allergen Reactions   Ciprofloxacin Itching    No rash, just itching.    Lipitor [Atorvastatin Calcium] Other (See Comments)    Unknown    Septra [Bactrim] Other (See Comments)    Gi upset    Sulfa Antibiotics Other (See Comments)    Gi upset     Medications: I have reviewed the patient's current medications.  Results for orders placed or performed during the hospital encounter of 11/22/22 (from the past 48 hour(s))  Magnesium     Status: Abnormal   Collection Time: 11/22/22 11:25 PM  Result Value Ref Range   Magnesium 1.6 (L) 1.7 - 2.4 mg/dL    Comment: Performed at [facility], [address], [place], [place] [address]  CBC     Status: Abnormal   Collection Time: 11/22/22 11:25 PM  Result Value Ref Range   WBC 10.2 4.0 - 10.5 K/uL   RBC 3.89 3.87 - 5.11 MIL/uL   Hemoglobin 11.2 (L) 12.0 - 15.0 g/dL   HCT 19.1 (L) 47.8 - 29.5 %   MCV 77.4 (L) 80.0 - 100.0 fL   MCH 28.8 26.0 - 34.0 pg   MCHC 37.2 (H) 30.0 -  36.0 g/dL   RDW 16.1 09.6 - 04.5 %   Platelets 299 150 - 400 K/uL   nRBC 0.0 0.0 - 0.2 %    Comment: Performed at [facility], [address], [place], [place] [address]  TSH     Status: None   Collection Time: 11/22/22 11:25 PM  Result Value Ref Range   TSH 2.133 0.350 - 4.500 uIU/mL    Comment: Performed by a 3rd Generation assay with a functional sensitivity of <=0.01 uIU/mL. Performed at [facility], [address] [place], [place] [address]   Comprehensive metabolic panel     Status: Abnormal   Collection Time: 11/22/22 11:25 PM  Result Value Ref Range   Sodium 126 (L) 135 - 145 mmol/L   Potassium 2.0 (LL) 3.5 - 5.1 mmol/L    Comment: CRITICAL RESULT CALLED TO, READ BACK BY AND VERIFIED WITH ELLWANGER, A AT 0008 ON 11/23/22 BY SMN.   Chloride 90 (L) 98 - 111 mmol/L   CO2 24 22 - 32 mmol/L   Glucose, Bld 132 (H) 70 - 99 mg/dL    Comment: Glucose reference range applies only to samples taken after fasting for at least 8 hours.    BUN 17 8 - 23 mg/dL   Creatinine, Ser 8.29 (H) 0.44 - 1.00 mg/dL   Calcium 8.1 (L) 8.9 - 10.3 mg/dL   Total Protein 5.9 (L) 6.5 - 8.1 g/dL   Albumin 2.3 (L) 3.5 - 5.0 g/dL   AST 562 (H) 15 - 41 U/L   ALT 119 (H) 0 - 44 U/L   Alkaline Phosphatase 641 (H) 38 - 126 U/L   Total Bilirubin 9.3 (H) 0.3 - 1.2 mg/dL   GFR, Estimated 41 (L) >60 mL/min    Comment: (NOTE) Calculated using the CKD-EPI Creatinine Equation (2021)    Anion gap 12 5 - 15    Comment: Performed at [facility], [address] [place], [place] [address]  Protime-INR (if pt is taking coumadin)     Status: Abnormal   Collection Time: 11/22/22 11:25 PM  Result Value Ref Range   Prothrombin Time 18.4 (H) 11.4 - 15.2 seconds   INR 1.5 (H) 0.8 - 1.2    Comment: (NOTE) INR goal varies based on device and disease states. Performed at [facility], [address], [place], [place] [address]   APTT  (IF patient is taking Pradaxa)     Status: None   Collection Time: 11/22/22 11:25 PM  Result Value Ref Range   aPTT 33 24 - 36 seconds    Comment: Performed at [facility], [address] [place], [place] [address]  Troponin I (High Sensitivity)     Status: Abnormal   Collection Time: 11/22/22 11:25 PM  Result Value Ref Range   Troponin I (High Sensitivity) 40 (H) <18 ng/L    Comment: (NOTE) Elevated high sensitivity troponin I (hsTnI) values and significant  changes across serial measurements may suggest ACS but many other  chronic and acute conditions are known to elevate hsTnI results.  Refer to the "Links" section for chest pain algorithms and additional  guidance. Performed at [facility], [address] [place], [place] [address]   Urinalysis, Routine w reflex microscopic -Urine, Clean Catch     Status: Abnormal   Collection Time: 11/23/22 12:42 AM  Result Value Ref Range   Color, Urine AMBER (A) YELLOW    Comment: BIOCHEMICALS MAY BE AFFECTED BY COLOR   APPearance CLEAR CLEAR   Specific  Gravity, Urine 1.008  1.005 - 1.030   pH 6.0 5.0 - 8.0   Glucose, UA NEGATIVE NEGATIVE mg/dL   Hgb urine dipstick SMALL (A) NEGATIVE   Bilirubin Urine SMALL (A) NEGATIVE   Ketones, ur NEGATIVE NEGATIVE mg/dL   Protein, ur 161 (A) NEGATIVE mg/dL   Nitrite NEGATIVE NEGATIVE   Leukocytes,Ua NEGATIVE NEGATIVE   RBC / HPF 0-5 0 - 5 RBC/hpf   WBC, UA 0-5 0 - 5 WBC/hpf   Bacteria, UA NONE SEEN NONE SEEN   Squamous Epithelial / HPF 0-5 0 - 5 /HPF   Mucus PRESENT     Comment: Performed at [facility], [address] [place], [place] [address]  Troponin I (High Sensitivity)     Status: Abnormal   Collection Time: 11/23/22  1:55 AM  Result Value Ref Range   Troponin I (High Sensitivity) 48 (H) <18 ng/L    Comment: (NOTE) Elevated high sensitivity troponin I (hsTnI) values and significant  changes across serial measurements may suggest ACS but many other  chronic and acute conditions are known to elevate hsTnI results.  Refer to the "Links" section for chest pain algorithms and additional  guidance. Performed at [facility], [address] [place], [place] [address]   Lipase, blood     Status: Abnormal   Collection Time: 11/23/22  6:45 AM  Result Value Ref Range   Lipase 87 (H) 11 - 51 U/L    Comment: Performed at [facility], [address], [place], [place] [address]  CBC with Differential/Platelet     Status: Abnormal   Collection Time: 11/23/22  7:50 AM  Result Value Ref Range   WBC 10.4 4.0 - 10.5 K/uL   RBC 3.57 (L) 3.87 - 5.11 MIL/uL   Hemoglobin 10.1 (L) 12.0 - 15.0 g/dL   HCT 78.2 (L) 95.6 - 21.3 %   MCV 79.6 (L) 80.0 - 100.0 fL   MCH 28.3 26.0 - 34.0 pg   MCHC 35.6 30.0 - 36.0 g/dL   RDW 08.6 (H) 57.8 - 46.9 %   Platelets 267 150 - 400 K/uL   nRBC 0.0 0.0 - 0.2 %   Neutrophils Relative % 82 %   Neutro Abs 8.5 (H) 1.7 - 7.7 K/uL   Lymphocytes Relative 12 %   Lymphs Abs 1.2 0.7 - 4.0 K/uL   Monocytes Relative 6 %   Monocytes Absolute 0.7 0.1 - 1.0 K/uL   Eosinophils  Relative 0 %   Eosinophils Absolute 0.0 0.0 - 0.5 K/uL   Basophils Relative 0 %   Basophils Absolute 0.0 0.0 - 0.1 K/uL   Immature Granulocytes 0 %   Abs Immature Granulocytes 0.04 0.00 - 0.07 K/uL    Comment: Performed at [facility], [address], [place], [place] [address]  Phosphorus     Status: Abnormal   Collection Time: 11/23/22  7:50 AM  Result Value Ref Range   Phosphorus 1.9 (L) 2.5 - 4.6 mg/dL    Comment: ICTERUS AT THIS LEVEL MAY AFFECT RESULT Performed at [facility], [address], [place], [place] [address]   Hepatic function panel     Status: Abnormal   Collection Time: 11/23/22  7:50 AM  Result Value Ref Range   Total Protein 5.6 (L) 6.5 - 8.1 g/dL   Albumin 2.1 (L) 3.5 - 5.0 g/dL   AST 440 (H) 15 - 41 U/L   ALT 117 (H) 0 - 44 U/L   Alkaline  Phosphatase 574 (H) 38 - 126 U/L   Total Bilirubin 8.3 (H) 0.3 - 1.2 mg/dL   Bilirubin, Direct 5.0 (H) 0.0 - 0.2 mg/dL   Indirect Bilirubin 3.3 (H) 0.3 - 0.9 mg/dL    Comment: Performed at [facility], [address] [place], [place] [address]  Basic metabolic panel     Status: Abnormal   Collection Time: 11/23/22  7:50 AM  Result Value Ref Range   Sodium 127 (L) 135 - 145 mmol/L   Potassium 2.5 (LL) 3.5 - 5.1 mmol/L    Comment: CRITICAL RESULT CALLED TO, READ BACK BY AND VERIFIED WITH WILLIAMS,C. RN AT 6045 11/23/22 MULLINS,T    Chloride 102 98 - 111 mmol/L   CO2 21 (L) 22 - 32 mmol/L   Glucose, Bld 98 70 - 99 mg/dL    Comment: Glucose reference range applies only to samples taken after fasting for at least 8 hours.   BUN 15 8 - 23 mg/dL   Creatinine, Ser 4.09 (H) 0.44 - 1.00 mg/dL   Calcium 6.9 (L) 8.9 - 10.3 mg/dL   GFR, Estimated 49 (L) >60 mL/min    Comment: (NOTE) Calculated using the CKD-EPI Creatinine Equation (2021)    Anion gap 4 (L) 5 - 15    Comment: Performed at [facility], [address], [place], [place] [address]     CT ABDOMEN PELVIS W CONTRAST  Result Date: 11/23/2022 CLINICAL DATA:  82 year old female with syncope. Suspected biliary obstruction. EXAM: CT ABDOMEN AND PELVIS WITH CONTRAST TECHNIQUE: Multidetector CT imaging of the abdomen and pelvis was performed using the standard protocol following bolus administration of intravenous contrast. RADIATION DOSE REDUCTION: This exam was performed according to the departmental dose-optimization program which includes automated exposure control, adjustment of the mA and/or kV according to patient size and/or use of iterative reconstruction technique. CONTRAST:  OMNIPAQUE IOHEXOL 300 MG/ML  SOLN COMPARISON:  Lumbar MRI 02/21/2021. FINDINGS: Lower chest: Calcified coronary artery atherosclerosis. Mild cardiomegaly. No pericardial effusion. No pleural effusion. Symmetric lung base atelectasis. Additionally, small indeterminate 3 mm right lower lobe lung nodule series 6, image 1. Hepatobiliary: Severe diffuse dilatation of the biliary system. The gallbladder is hydropic, 16 cm in length. Diffuse severe intrahepatic and extrahepatic biliary ductal enlargement, the CBD is 24 mm diameter. And there is abnormal abrupt tapering and enhancement of the distal CBD (bird beak appearance series 8, image 44). The distal duct appears abnormally thickened and enhancing over a segment of up to 3 cm to the papilla. See coronal images 39 through 44. No superimposed discrete hepatic parenchymal mass. No definite porta hepatis lymphadenopathy. Pancreas: Diffusely abnormal pancreatic parenchyma with extensive multi cystic dilatation of the ductal system and subsequent parenchymal atrophy. The main pancreatic duct is difficult to identify, and the pancreatic head at the papilla is heterogeneous with loss of normal architecture in an area of about 4 cm on coronal image 41. However, there is no obvious tumor in case mint of vessels, lymphadenopathy, or pancreatic active inflammation. Spleen:  Diminutive, negative. Adrenals/Urinary Tract: Adrenal glands and kidneys remain within normal limits. Symmetric renal contrast excretion on the delayed images. Numerous pelvic phleboliths. Unremarkable bladder. Stomach/Bowel: Mild large bowel retained stool. Decompressed distal transverse colon. No convincing large bowel inflammation. No dilated small bowel. Decompressed stomach. Duodenum contains only a small volume of fluid. No free air or free fluid. Vascular/Lymphatic: Aortoiliac calcified atherosclerosis. Major arterial structures are patent. Normal caliber abdominal aorta. Portal venous system is patent. There  is no convincing vascular in case mint in the region of the pancreas or retroperitoneum. And no malignant lymphadenopathy identified. Reproductive: Within normal limits. Other: No pelvis free fluid. Musculoskeletal: No pelvis free fluid. Numerous pelvis phleboliths. No acute or suspicious osseous lesion identified. IMPRESSION: 1. Very severe obstructive biliary disease. Etiology appears to be diffuse abnormal thickening and enhancement of the distal 2-3 cm of the CBD contiguous with the Papilla (coronal images 39 to 44). CBD dilated up to 24 mm diameter, and virtually no normal pancreatic parenchyma. Papillary and/or Pancreatic Head Carcinoma not excluded. However, there is no regional vascular encasement, no convincing lymphadenopathy or regional metastatic disease. 2. Outside of the heterogeneous pancreatic head there is diffuse multi-cystic replacement and atrophy of the normal pancreas. And Severe intrahepatic biliary dilatation with Gallbladder Hydrops all secondary to #1. 3. Small indeterminate right lower lobe pulmonary nodule, follow-up to be determined by presence or absence of malignancy and #1. Electronically Signed   By: Odessa Fleming M.D.   On: 11/23/2022 05:47   CT Head Wo Contrast  Result Date: 11/23/2022 CLINICAL DATA:  82 year old female with syncope. Suspected biliary obstruction. EXAM: CT  HEAD WITHOUT CONTRAST TECHNIQUE: Contiguous axial images were obtained from the base of the skull through the vertex without intravenous contrast. RADIATION DOSE REDUCTION: This exam was performed according to the departmental dose-optimization program which includes automated exposure control, adjustment of the mA and/or kV according to patient size and/or use of iterative reconstruction technique. COMPARISON:  None Available. FINDINGS: Brain: No midline shift, mass effect, or evidence of intracranial mass lesion. No ventriculomegaly. No acute intracranial hemorrhage identified. Confluent bilateral cerebral white matter hypodensity, including bilateral deep white matter capsule involvement. Basal ganglia and posterior fossa relatively spared. No cortical encephalomalacia identified. No cortically based acute infarct identified. Basal ganglia vascular calcifications are mild. Mass effect on the cervicomedullary junction appears related to chronic/congenital skull base changes described below. Vascular: No suspicious intracranial vascular hyperdensity. Calcified atherosclerosis at the skull base. 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 Some evidence of associated craniocervical and C1-C2 degeneration. No skull fracture, No acute osseous abnormality identified. Sinuses/Orbits: Tympanic cavities and mastoids are clear. Paranasal sinuses are well aerated with occasional small mucous retention cysts. Other: No acute orbit or scalp soft tissue finding. Partially visible bilateral parotid gland atrophy and sialolithiasis. IMPRESSION: 1. No acute intracranial abnormality. 2. Advanced cerebral white matter changes, most commonly due to chronic small vessel disease. 3. 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 4. Parotid gland atrophy and sialolithiasis. Electronically Signed   By: Odessa Fleming M.D.    On: 11/23/2022 05:10   DG Chest Port 1 View  Result Date: 11/23/2022 CLINICAL DATA:  Syncope EXAM: PORTABLE CHEST 1 VIEW COMPARISON:  02/10/2018 FINDINGS: The heart size and mediastinal contours are within normal limits. Both lungs are clear. The visualized skeletal structures are unremarkable. IMPRESSION: No active disease. Electronically Signed   By: Sharlet Salina M.D.   On: 11/23/2022 00:23    Review of Systems negative except above Blood pressure (!) 145/77, pulse 75, temperature 98.1 F (36.7 C), temperature source Oral, resp. rate 16, height 5\' 7"  (1.702 m), weight 58.1 kg, SpO2 97 %. Physical Exam signs stable afebrile no acute distress lying comfortably in the bed abdomen is soft essentially nontender CT and labs reviewed obvious obstructive jaundice  Assessment/Plan: Obstructive jaundice Plan: The risk benefits methods and success rate of ERCP  and probable tissue sample and stenting was discussed with the patient and she is not sure she wants anything done and she did say if she had cancer she would not want to be treated and we discussed no obvious mass showed up on the CT however some of these obstructions are due to cancerous masses and after our prolonged conversation about whether to proceed with our ERCP we have decided on proceeding with an MRI to get better information and she would also like to talk to her daughter and in the meantime okay to have clear liquids and after MRI could advance her diet if nothing new and different shows up I will be on standby if she changes her mind about ERCP [facility] 11/23/2022, 9:04 AM

## 2022-11-23 NOTE — H&P (Signed)
History and Physical    Patient: Priscilla Daniels QMV:[id number] DOB: March 16, 1941 DOA: 11/22/2022 DOS: the patient was seen and examined on 11/23/2022 PCP: Gabriel Earing, FNP  Patient coming from: Home  Chief Complaint:  Chief Complaint  Patient presents with   Loss of Consciousness   HPI: Priscilla Daniels is a 82 y.o. female with medical history significant of abnormal Pap smear, vaginal atrophy, cutaneous candidiasis, chronic UTI, COPD, type 2 diabetes, fatigue, glaucoma, left eye optic nerve hemorrhage, hyperlipidemia, osteopenia, unspecified site phlebitis, strep pharyngitis, vertigo who presented to the emergency department due to having a syncopal episode while she was sitting down on the toilet trying to have a bowel movement.  She has been having decreased appetite, very mild nausea, no emesis, diarrhea, melena or hematochezia.  She was noticed to be jaundiced when arriving to the emergency department and her stools have been lighter than usual.  She has lost an unknown amount of weight recently.  Her symptoms have been going on for the past few weeks, but not a full month.  She is recently also started having pruritus.  She recently had frequency, mild dysuria, but no flank pain or hematuria.  She was given ciprofloxacin which she has already finished.  She denied fever, chills, rhinorrhea, sore throat, wheezing or hemoptysis.  No chest pain, palpitations, diaphoresis, PND, orthopnea or pitting edema of the lower extremities.   No flank pain, dysuria, frequency or hematuria.  No polyuria, polydipsia, polyphagia or blurred vision.   ED course: Initial vital signs were temperature 97.8 F, pulse 87, respirations 17, BP 70/49 mmHg O2 sat 100% on room air.  The patient received fentanyl 25 mcg IVP, Zosyn 3.375 g IVPB, NS 1000 mL bolus, KCl 40 mill equivalents p.o. in KCl 10 mEq IVPB x 6.  Lab work: Urinalysis with small hemoglobin, small bilirubin and 100 mg/dL of protein.  CBC showed a white  count of 10.2, hemoglobin 11.2 g/dL with an MCV of 28.4 fL and platelets 299.  PT 18.4, INR 1.5 PTT 33.  TSH was normal.  Troponin was 40 than 48 ng/L.  Magnesium was 1.6 mg/dL.  CMP showed a sodium 126, potassium 2.0, chloride 90 and CO2 24 mmol/L with a normal anion gap.  Glucose 132, BUN 17, creatinine 1.31 and calcium 8.1 mg deciliter but corrects to albumin level.  Total protein 5.9 and albumin 2.3 g/dL.  AST 299, ALT 119 and alkaline phosphatase 641 units/L.  Total bilirubin was 9.3 mg/dL.  Imaging: Portable 1 view chest radiograph with no active disease.  CT head without contrast with no acute intracranial normality.  There was advanced 0 white matter changes, most commonly due to chronic small vessel disease.  Congenital variation of the C1 vertebrae with evidence of associated chronic cervical and C1-C2 degeneration.  Parotid gland atrophy was cholelithiasis.  CT abdomen/pelvis with contrast shows very severe obstructive biliary disease there is diffuse abnormal thickening and enhancement of the distal 2-3 centimeters of's CBD continuous with the papilla and virtual no normal pancreatic parenchyma.  Biliary and/or pancreatic head.  Carcinoma not excluded.  However no regional vascular encasement, no convincing lymphadenopathy or regional metastatic disease.  Outside of the head of the pancreas there is diffuse multicystic replacement and atrophy of the normal pancreas.  Severe intrahepatic biliary dilatation with gallbladder hydrops.  Small indeterminate right lower lobe pulmonary nodule follow-up to be determined by the presence or absence of malignancy.  Review of Systems: As mentioned in the history of present  illness. All other systems reviewed and are negative. Past Medical History:  Diagnosis Date   Abnormal vaginal Pap smear    Candidiasis    cuteneous    Chronic UTI    COPD (chronic obstructive pulmonary disease) (HCC) 2010   Dx by CXR    DM (diabetes mellitus) (HCC)    Fatigue     Glaucoma    Hemorrhage in optic nerve sheath of left eye 01/23/2020   Hyperlipidemia    Diet Controlled    NIDDM (non-insulin dependent diabetes mellitus)    Osteopenia 03/12/2021   Phlebitis    Strep pharyngitis    Vaginal atrophy    Vertigo    Past Surgical History:  Procedure Laterality Date   CERVICAL CONE BIOPSY     GLAUCOMA SURGERY  12/04 & 07/05/03   left eye cataract surgery  01/29/2009   right eye cataract surgery  01/13/09   VARICOSE VEIN SURGERY     both legs    Social History:  reports that she has never smoked. She has never used smokeless tobacco. She reports that she does not drink alcohol and does not use drugs.  Allergies  Allergen Reactions   Ciprofloxacin Itching    No rash, just itching.    Lipitor [Atorvastatin Calcium]    Septra [Bactrim]    Sulfa Antibiotics     Family History  Problem Relation Age of Onset   Stroke Mother    Cancer Father    Cancer Sister    Diabetes Brother    Hyperlipidemia Brother    Hyperlipidemia Brother     Prior to Admission medications   Medication Sig Start Date End Date Taking? Authorizing Provider  acetaminophen (TYLENOL) 500 MG tablet Take 500 mg by mouth every 6 (six) hours as needed.    [provider]  aspirin 81 MG EC tablet Take 81 mg by mouth daily. Swallow whole.    [provider]  Calcium Carb-Cholecalciferol (CALCIUM 600/VITAMIN D PO) Take by mouth. With 2000U vitamin d , 2 tablets daily    [provider]  dorzolamide (TRUSOPT) 2 % ophthalmic solution Place 1 drop into both eyes 2 (two) times daily. 01/29/22   Rankin, Alford Highland, MD  lisinopril-hydrochlorothiazide (ZESTORETIC) 20-12.5 MG tablet TAKE ONE (1) TABLET BY MOUTH EVERY DAY 07/02/22   Gabriel Earing, FNP  mirabegron ER (MYRBETRIQ) 25 MG TB24 tablet Take 1 tablet (25 mg total) by mouth daily. 07/02/22   Gabriel Earing, FNP  rosuvastatin (CRESTOR) 10 MG tablet TAKE ONE (1) TABLET BY MOUTH EVERY DAY 11/19/22   Gabriel Earing, FNP  Travoprost, BAK Free, (TRAVATAN Z) 0.004 % SOLN ophthalmic solution Place 1 drop into both eyes at bedtime. 01/29/22 01/30/23  Rankin, Alford Highland, MD  triamcinolone (KENALOG) 0.025 % ointment Apply 1 Application topically 2 (two) times daily. 11/19/22   Junie Spencer, FNP    Physical Exam: Vitals:   11/23/22 0300 11/23/22 0326 11/23/22 0515 11/23/22 0701  BP: (!) 148/86  (!) 154/85   Pulse: 77  78   Resp: 19  19   Temp:  99.3 F (37.4 C)  98.4 F (36.9 C)  TempSrc:  Rectal  Oral  SpO2: 97%  96%   Weight:      Height:       Physical Exam Constitutional:      General: She is awake. She is not in acute distress.    Appearance: Normal appearance. She is ill-appearing.  HENT:  Head: Normocephalic.     Nose: No rhinorrhea.     Mouth/Throat:     Mouth: Mucous membranes are dry.  Eyes:     General: Scleral icterus present.     Pupils: Pupils are equal, round, and reactive to light.  Neck:     Vascular: No JVD.  Cardiovascular:     Rate and Rhythm: Normal rate and regular rhythm.     Heart sounds: S1 normal and S2 normal.  Pulmonary:     Effort: Pulmonary effort is normal.     Breath sounds: Normal breath sounds.  Abdominal:     General: Bowel sounds are normal. There is no distension.     Palpations: Abdomen is soft.     Tenderness: There is abdominal tenderness. There is no guarding or rebound.  Musculoskeletal:     Cervical back: Neck supple.     Right lower leg: No edema.     Left lower leg: No edema.  Skin:    General: Skin is warm and dry.  Neurological:     General: No focal deficit present.     Mental Status: She is alert.  Psychiatric:        Mood and Affect: Mood normal.        Behavior: Behavior normal. Behavior is cooperative.   Data Reviewed:  There are no new results to review at this time.  Assessment and Plan: Principal Problem:   Obstructive jaundice Inpatient/stepdown. Continue IV fluids. Hold statin therapy. Full liquid  diet. Analgesics as needed. Antiemetics as needed. Pantoprazole 40 mg IVP daily. Follow CBC, CMP in AM. MRCP showed pancreatic mass. Will need ERCP during this hospitalization with GI. The patient does not want any treatment if it is pancreatic cancer.  Active Problems:   Atrial fibrillation (HCC)  Seen on cardiac monitor. New EKG ordered. CHA2DS2-VASc Score of at least 7. Declined anticoagulation. May change her mind after talking to cardiology. Check transthoracic echocardiogram. Cardiology has been consulted.    Prolonged QT interval  Avoid QT prolonging meds as possible. KCl supplementation. Magnesium sulfate 2 g IVPB now. Optimize electrolytes. Check EKG in the morning.    Syncope Sounds vasovagal by history. Checking echo as above.    Elevated troponin Likely demand ischemia.    Hyponatremia Secondary to diuretic. Secondary to poor oral intake. Hold HCTZ. Continue normal saline infusion. Follow sodium level.    Hypokalemia Secondary to diuretic. Replacing. Follow-up potassium level.    Hypophosphatemia Replacing. Follow level as needed.    Hypocalcemia Check calcium and albumin level in AM. Further workup depending on results.    CKD (chronic kidney disease), stage III (HCC) Follow GFR and electrolytes.    Prediabetes Check hemoglobin A1c. CBG monitoring before meals and bedtime.    Glaucoma (increased eye pressure) Continue Travatan and Trusopt drops.    Hyperlipidemia with target LDL less than 100   Atherosclerosis of aorta (HCC) Hold rosuvastatin.    Primary hypertension Continue lisinopril 20 mg p.o. daily.  Hold HCTZ due to hyponatremia. Monitor BP, renal function and electrolytes.    Microcytic anemia Check anemia panel. Monitor hematocrit and hemoglobin.      Advance Care Planning:   Code Status: Full Code   Consults: [facility] Vida Rigger, MD).  Family Communication: His daughter was at bedside.  Severity of  Illness: The appropriate patient status for this patient is INPATIENT. Inpatient status is judged to be reasonable and necessary in order to provide the required intensity of service to ensure  the patient's safety. The patient's presenting symptoms, physical exam findings, and initial radiographic and laboratory data in the context of their chronic comorbidities is felt to place them at high risk for further clinical deterioration. Furthermore, it is not anticipated that the patient will be medically stable for discharge from the hospital within 2 midnights of admission.   * I certify that at the point of admission it is my clinical judgment that the patient will require inpatient hospital care spanning beyond 2 midnights from the point of admission due to high intensity of service, high risk for further deterioration and high frequency of surveillance required.*  Author: Bobette Mo, MD 11/23/2022 7:17 AM  For on call review [email or web address].   This document was prepared using Dragon voice recognition software and may contain some unintended transcription errors.

## 2022-11-23 NOTE — ED Provider Notes (Signed)
Patient with significant biliary dilatation.  She is  not septic appearing.  Blood pressure is now appropriate.  Will add on IV Zosyn.  Patient will be admitted to the hospitalist, discussed with Dr. Margo Aye.  Secure chat is been sent to Dr. Ewing Schlein, who will have his service see the patient   Zadie Rhine, MD 11/23/22 [phone]

## 2022-11-24 ENCOUNTER — Inpatient Hospital Stay (HOSPITAL_COMMUNITY): Payer: Medicare Other

## 2022-11-24 DIAGNOSIS — E876 Hypokalemia: Secondary | ICD-10-CM

## 2022-11-24 DIAGNOSIS — R7989 Other specified abnormal findings of blood chemistry: Secondary | ICD-10-CM | POA: Diagnosis present

## 2022-11-24 DIAGNOSIS — I10 Essential (primary) hypertension: Secondary | ICD-10-CM

## 2022-11-24 DIAGNOSIS — N179 Acute kidney failure, unspecified: Secondary | ICD-10-CM | POA: Diagnosis not present

## 2022-11-24 DIAGNOSIS — R55 Syncope and collapse: Secondary | ICD-10-CM | POA: Diagnosis not present

## 2022-11-24 DIAGNOSIS — L8990 Pressure ulcer of unspecified site, unspecified stage: Secondary | ICD-10-CM | POA: Insufficient documentation

## 2022-11-24 DIAGNOSIS — K8689 Other specified diseases of pancreas: Secondary | ICD-10-CM

## 2022-11-24 DIAGNOSIS — E871 Hypo-osmolality and hyponatremia: Secondary | ICD-10-CM

## 2022-11-24 DIAGNOSIS — I491 Atrial premature depolarization: Secondary | ICD-10-CM | POA: Diagnosis not present

## 2022-11-24 DIAGNOSIS — L298 Other pruritus: Secondary | ICD-10-CM

## 2022-11-24 DIAGNOSIS — I4891 Unspecified atrial fibrillation: Secondary | ICD-10-CM

## 2022-11-24 DIAGNOSIS — Z7189 Other specified counseling: Secondary | ICD-10-CM

## 2022-11-24 DIAGNOSIS — K831 Obstruction of bile duct: Secondary | ICD-10-CM | POA: Diagnosis not present

## 2022-11-24 LAB — ECHOCARDIOGRAM COMPLETE
AR max vel: 2.66 cm2
AV Area VTI: 2.87 cm2
AV Area mean vel: 2.53 cm2
AV Mean grad: 4 mmHg
AV Peak grad: 7.1 mmHg
Ao pk vel: 1.33 m/s
Area-P 1/2: 4.06 cm2
Height: 67 in
S' Lateral: 2.6 cm
Single Plane A4C EF: 65.8 %
Weight: 2215.18 oz

## 2022-11-24 LAB — RETICULOCYTES
Immature Retic Fract: 5.8 % (ref 2.3–15.9)
RBC.: 3.26 MIL/uL — ABNORMAL LOW (ref 3.87–5.11)
Retic Count, Absolute: 27.1 10*3/uL (ref 19.0–186.0)
Retic Ct Pct: 0.8 % (ref 0.4–3.1)

## 2022-11-24 LAB — CBC
HCT: 26.8 % — ABNORMAL LOW (ref 36.0–46.0)
Hemoglobin: 9.5 g/dL — ABNORMAL LOW (ref 12.0–15.0)
MCH: 28.6 pg (ref 26.0–34.0)
MCHC: 35.4 g/dL (ref 30.0–36.0)
MCV: 80.7 fL (ref 80.0–100.0)
Platelets: 263 10*3/uL (ref 150–400)
RBC: 3.32 MIL/uL — ABNORMAL LOW (ref 3.87–5.11)
RDW: 15.7 % — ABNORMAL HIGH (ref 11.5–15.5)
WBC: 7.7 10*3/uL (ref 4.0–10.5)
nRBC: 0 % (ref 0.0–0.2)

## 2022-11-24 LAB — IRON AND TIBC
Iron: 98 ug/dL (ref 28–170)
Saturation Ratios: 54 % — ABNORMAL HIGH (ref 10.4–31.8)
TIBC: 181 ug/dL — ABNORMAL LOW (ref 250–450)
UIBC: 83 ug/dL

## 2022-11-24 LAB — COMPREHENSIVE METABOLIC PANEL
ALT: 125 U/L — ABNORMAL HIGH (ref 0–44)
AST: 310 U/L — ABNORMAL HIGH (ref 15–41)
Albumin: 2 g/dL — ABNORMAL LOW (ref 3.5–5.0)
Alkaline Phosphatase: 594 U/L — ABNORMAL HIGH (ref 38–126)
Anion gap: 8 (ref 5–15)
BUN: 12 mg/dL (ref 8–23)
CO2: 21 mmol/L — ABNORMAL LOW (ref 22–32)
Calcium: 7.8 mg/dL — ABNORMAL LOW (ref 8.9–10.3)
Chloride: 103 mmol/L (ref 98–111)
Creatinine, Ser: 0.93 mg/dL (ref 0.44–1.00)
GFR, Estimated: >60 mL/min (ref >60)
Glucose, Bld: 120 mg/dL — ABNORMAL HIGH (ref 70–99)
Potassium: 3.2 mmol/L — ABNORMAL LOW (ref 3.5–5.1)
Sodium: 132 mmol/L — ABNORMAL LOW (ref 135–145)
Total Bilirubin: 8 mg/dL — ABNORMAL HIGH (ref 0.3–1.2)
Total Protein: 5.2 g/dL — ABNORMAL LOW (ref 6.5–8.1)

## 2022-11-24 LAB — GLUCOSE, CAPILLARY
Glucose-Capillary: 129 mg/dL — ABNORMAL HIGH (ref 70–99)
Glucose-Capillary: 119 mg/dL — ABNORMAL HIGH (ref 70–99)
Glucose-Capillary: 108 mg/dL — ABNORMAL HIGH (ref 70–99)
Glucose-Capillary: 124 mg/dL — ABNORMAL HIGH (ref 70–99)
Glucose-Capillary: 152 mg/dL — ABNORMAL HIGH (ref 70–99)

## 2022-11-24 LAB — VITAMIN B12: Vitamin B-12: 4074 pg/mL — ABNORMAL HIGH (ref 180–914)

## 2022-11-24 LAB — FOLATE: Folate: 12.2 ng/mL (ref >5.9)

## 2022-11-24 LAB — FERRITIN: Ferritin: 605 ng/mL — ABNORMAL HIGH (ref 11–307)

## 2022-11-24 LAB — MAGNESIUM: Magnesium: 1.5 mg/dL — ABNORMAL LOW (ref 1.7–2.4)

## 2022-11-24 MED ORDER — POTASSIUM CHLORIDE 10 MEQ/100ML IV SOLN
10.0000 meq | INTRAVENOUS | Status: AC
  Administered 2022-11-24 (×2): 10 meq via INTRAVENOUS
  Filled 2022-11-24 (×2): qty 100

## 2022-11-24 MED ORDER — ACETAMINOPHEN 325 MG PO TABS
650.0000 mg | ORAL_TABLET | ORAL | Status: DC | PRN
  Administered 2022-11-24: 650 mg via ORAL
  Filled 2022-11-24: qty 2

## 2022-11-24 MED ORDER — CHLORHEXIDINE GLUCONATE CLOTH 2 % EX PADS: 6.0000 | MEDICATED_PAD | Freq: Every day | CUTANEOUS | Status: DC

## 2022-11-24 MED ORDER — MAGNESIUM SULFATE 2 GM/50ML IV SOLN
2.0000 g | Freq: Once | INTRAVENOUS | Status: AC
  Administered 2022-11-24: 2 g via INTRAVENOUS
  Filled 2022-11-24: qty 50

## 2022-11-24 NOTE — Plan of Care (Signed)
Plan of care and goals discussed with patient and family, patient guide handbook also given, time given for questions and answers.  Problem: Education: Goal: Knowledge of General Education information will improve Description: Including pain rating scale, medication(s)/side effects and non-pharmacologic comfort measures Outcome: Progressing   Problem: Health Behavior/Discharge Planning: Goal: Ability to manage health-related needs will improve Outcome: Progressing   Problem: Clinical Measurements: Goal: Ability to maintain clinical measurements within normal limits will improve Outcome: Progressing Goal: Will remain free from infection Outcome: Progressing Goal: Diagnostic test results will improve Outcome: Progressing Goal: Respiratory complications will improve Outcome: Progressing Goal: Cardiovascular complication will be avoided Outcome: Progressing   Problem: Activity: Goal: Risk for activity intolerance will decrease Outcome: Progressing   Problem: Nutrition: Goal: Adequate nutrition will be maintained Outcome: Progressing   Problem: Coping: Goal: Level of anxiety will decrease Outcome: Progressing   Problem: Elimination: Goal: Will not experience complications related to bowel motility Outcome: Progressing Goal: Will not experience complications related to urinary retention Outcome: Progressing   Problem: Pain Managment: Goal: General experience of comfort will improve Outcome: Progressing   Problem: Safety: Goal: Ability to remain free from injury will improve Outcome: Progressing   Problem: Skin Integrity: Goal: Risk for impaired skin integrity will decrease Outcome: Progressing   Problem: Education: Goal: Knowledge of condition and prescribed therapy will improve Outcome: Progressing   Problem: Cardiac: Goal: Will achieve and/or maintain adequate cardiac output Outcome: Progressing   Problem: Physical Regulation: Goal: Complications related to  the disease process, condition or treatment will be avoided or minimized Outcome: Progressing   Problem: Education: Goal: Ability to describe self-care measures that may prevent or decrease complications (Diabetes Survival Skills Education) will improve Outcome: Progressing Goal: Individualized Educational Video(s) Outcome: Progressing   Problem: Coping: Goal: Ability to adjust to condition or change in health will improve Outcome: Progressing   Problem: Fluid Volume: Goal: Ability to maintain a balanced intake and output will improve Outcome: Progressing   Problem: Health Behavior/Discharge Planning: Goal: Ability to identify and utilize available resources and services will improve Outcome: Progressing Goal: Ability to manage health-related needs will improve Outcome: Progressing   Problem: Metabolic: Goal: Ability to maintain appropriate glucose levels will improve Outcome: Progressing   Problem: Nutritional: Goal: Maintenance of adequate nutrition will improve Outcome: Progressing Goal: Progress toward achieving an optimal weight will improve Outcome: Progressing   Problem: Skin Integrity: Goal: Risk for impaired skin integrity will decrease Outcome: Progressing   Problem: Tissue Perfusion: Goal: Adequacy of tissue perfusion will improve Outcome: Progressing

## 2022-11-24 NOTE — Progress Notes (Signed)
PROGRESS NOTE   Priscilla Daniels  ZOX:[id number] DOB: 1940-08-13 DOA: 11/22/2022 PCP: Gabriel Earing, FNP   Date of Service: the patient was seen and examined on 11/24/2022  Brief Narrative:  82 year old female with past medical history of COPD, diabetes mellitus type 2, hyperlipidemia presenting to [facility] emergency department on 6/2 for loss of consciousness while sitting on the commode.  Upon evaluation in the [facility] emergency department patient was then transferred to the [facility] emergency department for further evaluation including CT imaging.    Upon evaluation in the [facility] emergency department CT imaging of the abdomen pelvis revealed very severe obstructive biliary disease with enhancement of the distal 2 to 3 cm of the common bile duct with findings concerning for papillary and/or pancreatic head carcinoma. The hospitalist group was then called to assess the patient admission the hospital.  Dr. Ewing Schlein with gastroenterology was additionally consulted for further evaluation.  Patient was admitted to stepdown unit and placed on intravenous Protonix.  Shortly after hospitalization patient developed atrial fibrillation for which an echocardiogram was ordered.  Cardiology was consulted.  CHA2DS2-VASc or was found to be markedly elevated however patient declined anticoagulation.  An MRCP was performed on 6/3 identifying findings compatible with pancreatic head malignancy with malignant biliary obstruction and a 3.8 x 3.8 x 5.0 cm pancreatic head mass.   Assessment and Plan: No notes have been filed under this hospital service. Service: Hospitalist       Subjective:  ***  Physical Exam:  Vitals:   11/24/22 0400 11/24/22 0403 11/24/22 0500 11/24/22 0600  BP: (!) 171/80  (!) 164/70 (!) 153/67  Pulse: 63 64 80 61  Resp: 14 15 15 15   Temp:  98 F (36.7 C)    TempSrc:  Oral    SpO2: 94% 97% 95% 94%  Weight:  62.8 kg     Height:        *** Constitutional: Awake alert and oriented x3, no associated distress.   Skin: no rashes, no lesions, good skin turgor noted. Eyes: Pupils are equally reactive to light.  No evidence of scleral icterus or conjunctival pallor.  ENMT: Moist mucous membranes noted.  Posterior pharynx clear of any exudate or lesions.   Respiratory: clear to auscultation bilaterally, no wheezing, no crackles. Normal respiratory effort. No accessory muscle use.  Cardiovascular: Regular rate and rhythm, no murmurs / rubs / gallops. No extremity edema. 2+ pedal pulses. No carotid bruits.  Abdomen: Abdomen is soft and nontender.  No evidence of intra-abdominal masses.  Positive bowel sounds noted in all quadrants.   Musculoskeletal: No joint deformity upper and lower extremities. Good ROM, no contractures. Normal muscle tone.    Data Reviewed:  I have personally reviewed and interpreted labs, imaging.  Significant findings are ***  CBC: Recent Labs  Lab 11/22/22 2325 11/23/22 0750 11/24/22 0327  WBC 10.2 10.4 7.7  NEUTROABS  --  8.5*  --   HGB 11.2* 10.1* 9.5*  HCT 30.1* 28.4* 26.8*  MCV 77.4* 79.6* 80.7  PLT 299 267 263   Basic Metabolic Panel: Recent Labs  Lab 11/22/22 2325 11/23/22 0750 11/23/22 1557 11/24/22 0327 11/24/22 0413  NA 126* 127* 130* 132*  --   K 2.0* 2.5* 3.1* 3.2*  --   CL 90* 102 99 103  --   CO2 24 21* 21* 21*  --   GLUCOSE 132* 98 145* 120*  --   BUN 17 15 15 12   --  CREATININE 1.31* 1.13* 1.12* 0.93  --   CALCIUM 8.1* 6.9* 7.6* 7.8*  --   MG 1.6*  --   --   --  1.5*  PHOS  --  1.9*  --   --   --    GFR: Estimated Creatinine Clearance: 45.4 mL/min (by C-G formula based on SCr of 0.93 mg/dL). Liver Function Tests: Recent Labs  Lab 11/22/22 2325 11/23/22 0750 11/24/22 0327  AST 299* 314* 310*  ALT 119* 117* 125*  ALKPHOS 641* 574* 594*  BILITOT 9.3* 8.3* 8.0*  PROT 5.9* 5.6* 5.2*  ALBUMIN 2.3* 2.1* 2.0*    Coagulation Profile: Recent  Labs  Lab 11/22/22 2325  INR 1.5*     EKG/Telemetry: Personally reviewed.  Rhythm is *** with heart rate of ***.  No dynamic ST segment changes appreciated.   Code Status:  {Palliative Code status:23503}.  Code status decision has been confirmed with: *** Family Communication: ***    Severity of Illness:  {Observation/Inpatient:21159}  Time spent:  *** minutes  Author:  Marinda Elk MD  11/24/2022 8:33 AM

## 2022-11-24 NOTE — TOC Initial Note (Signed)
Transition of Care [facility]) - Initial/Assessment Note    Patient Details  Name: Priscilla Daniels MRN: [id number] Date of Birth: [birth year]  Transition of Care [facility]) CM/SW Contact:    Beckie Busing, RN Phone Number:[phone]  11/24/2022, 3:15 PM  Clinical Narrative:                 Transition of Care [facility]) - Inpatient Brief Assessment   Patient Details  Name: Priscilla Daniels MRN: [id number] Date of Birth: 17-Apr-1941  Transition of Care [facility]) CM/SW Contact:    Beckie Busing, RN Phone Number: 11/24/2022, 3:15 PM   Clinical Narrative: TOC assessment complete. TOC will continue to follow for needs   Transition of Care Asessment: Insurance and Status: Insurance coverage has been reviewed Patient has primary care physician: Yes Home environment has been reviewed: From home Prior level of function:: independent Prior/Current Home Services: No current home services Social Determinants of Health Reivew: SDOH reviewed no interventions necessary Readmission risk has been reviewed: Yes Transition of care needs: no transition of care needs at this time         Patient Goals and CMS Choice            Expected Discharge Plan and Services                                              Prior Living Arrangements/Services                       Activities of Daily Living Home Assistive Devices/Equipment: Bedside commode/3-in-1, Eyeglasses, Hearing aid, Grab bars in shower ADL Screening (condition at time of admission) Patient's cognitive ability adequate to safely complete daily activities?: Yes Is the patient deaf or have difficulty hearing?: Yes Does the patient have difficulty seeing, even when wearing glasses/contacts?: No Does the patient have difficulty concentrating, remembering, or making decisions?: No Patient able to express need for assistance with ADLs?: Yes Does the patient have difficulty dressing or bathing?: Yes Independently performs ADLs?:  Yes (appropriate for developmental age) Does the patient have difficulty walking or climbing stairs?: Yes Weakness of Legs: Both Weakness of Arms/Hands: None  Permission Sought/Granted                  Emotional Assessment              Admission diagnosis:  Hypokalemia [E87.6] Acute hepatitis [B17.9] Syncope, unspecified syncope type [R55] Patient Active Problem List   Diagnosis Date Noted   Hypomagnesemia 11/24/2022   Pressure injury of skin 11/24/2022   Pancreatic mass 11/24/2022   AKI (acute kidney injury) (HCC) 11/24/2022   Elevated troponin level not due myocardial infarction 11/24/2022   Microcytic anemia 11/23/2022   Hyponatremia 11/23/2022   Elevated troponin 11/23/2022   Hypokalemia 11/23/2022   Hypophosphatemia 11/23/2022   Hypocalcemia 11/23/2022   Syncope 11/23/2022   Common bile duct (CBD) obstruction 11/23/2022   Atrial fibrillation, new onset (HCC) 11/23/2022   Prolonged QT interval 11/23/2022   Recurrent UTI 07/01/2022   Depression, recurrent (HCC) 07/01/2022   Chronic right-sided low back pain with right-sided sciatica 05/26/2021   Neural foraminal stenosis of cervical spine 05/26/2021   Urge incontinence of urine 05/26/2021   Varicose veins of both lower extremities with pain 05/26/2021   Prediabetes 03/12/2021   Vitamin D insufficiency 03/12/2021   Arthritis 03/12/2021  Osteopenia 03/12/2021   Posterior vitreous detachment, both eyes 07/30/2020   Central retinal vein occlusion of left eye 01/23/2020   Intermediate stage nonexudative age-related macular degeneration of both eyes 01/23/2020   Primary open angle glaucoma of both eyes, moderate stage 01/23/2020   Retinal hemorrhage of left eye 01/23/2020   CKD (chronic kidney disease), stage III (HCC) 02/11/2015   Atherosclerosis of aorta (HCC) 02/08/2015   Glaucoma (increased eye pressure) 09/30/2012   Hyperlipidemia with target LDL less than 100 09/30/2012   Essential hypertension  09/30/2012   PCP:  Gabriel Earing, FNP Pharmacy:   [facility] [place] [place] - [address] [place] [address] Phone: [phone] Fax: [phone]  [facility] [address] [place], [place] - [address] [place] [address] Phone: [phone] Fax: [phone]     Social Determinants of Health (SDOH) Social History: SDOH Screenings   Food Insecurity: No Food Insecurity (11/23/2022)  Housing: Low Risk  (11/23/2022)  Transportation Needs: No Transportation Needs (11/23/2022)  Utilities: Not At Risk (11/23/2022)  Alcohol Screen: Low Risk  (10/07/2022)  Depression (PHQ2-9): Low Risk  (10/07/2022)  Financial Resource Strain: Low Risk  (10/07/2022)  Physical Activity: Insufficiently Active (10/07/2022)  Social Connections: Socially Isolated (10/07/2022)  Stress: No Stress Concern Present (10/07/2022)  Tobacco Use: Low Risk  (11/23/2022)   SDOH Interventions:     Readmission Risk Interventions     No data to display

## 2022-11-24 NOTE — Consult Note (Signed)
Cardiology Consultation   Patient ID: Priscilla Daniels MRN: [id number]; DOB: 01/01/1941  Admit date: 11/22/2022 Date of Consult: 11/24/2022  PCP:  Gabriel Earing, FNP   [facility] Providers Cardiologist:  Reatha Harps, MD   -- New this admission    Patient Profile:   Priscilla Daniels is a 82 y.o. female with a hx of chronic UTI, COPD, type 2 DM, fatigue, glaucoma, HLD, osteopenia, vertigo who is being seen 11/24/2022 for the evaluation of new onset atrial fibrillation at the request of Dr. Robb Matar.  History of Present Illness:   Priscilla Daniels is an 82 year old female with above medical history. Per chart review, patient does not appear to have any past cardiac history and does not follow with a cardiologist.   Patient presented to the ED on 6/2 complaining of syncope that occurred while she was sitting on the toilet trying to have a bowel movement. Also complained of decrease appetite, mild nausea. In the ED, she was noted to be jaundiced and she reported having light colored stools, pruritus, and unexplained weight loss for the past few weeks. Labs in the ED showed K 2.0, Na 126, creatinine 1.31, mag 1.6, AST 299, ALT 119, total protein 5.9, total bilirubin 9.3, WBC 10.2, hemoglobin 11.2. hsTn 40>48.   Initial EKG in the ED showed sinus rhythm with PACs, HR 83 BPM, prolonged QT (Qtc 562). CXR showed no active disease. CT abdomen showed very severe obstructive biliary disease. There was diffusely abnormal pancreatic parenchyma, could not rule out papillary and/or pancreatic head carcinoma.   Patient was admitted to the internal medicine service for obstructive jaundice. GI was consulted. Per their notes, they recommended ERCP but patient was unsure if she wanted anything done. She mentioned that if she had cancer, she would not want to be treated. GI planning to obtain MRI. Patient was also noted to have new onset atrial fibrillation on telemetry. IM attempted to start  anticoagulation, but patient declined. Echocardiogram pending.   On interview, patient denies having any past cardiac history. Denies chest pain, palpitations, shortness of breath, orthopnea, weakness, fatigue.   Past Medical History:  Diagnosis Date   Abnormal vaginal Pap smear    Candidiasis    cuteneous    Chronic UTI    COPD (chronic obstructive pulmonary disease) (HCC) 2010   Dx by CXR    DM (diabetes mellitus) (HCC)    Fatigue    Glaucoma    Hemorrhage in optic nerve sheath of left eye 01/23/2020   Hyperlipidemia    Diet Controlled    NIDDM (non-insulin dependent diabetes mellitus)    Osteopenia 03/12/2021   Phlebitis    Strep pharyngitis    Vaginal atrophy    Vertigo     Past Surgical History:  Procedure Laterality Date   CERVICAL CONE BIOPSY     GLAUCOMA SURGERY  12/04 & 07/05/03   left eye cataract surgery  01/29/2009   right eye cataract surgery  01/13/09   VARICOSE VEIN SURGERY     both legs      Home Medications:  Prior to Admission medications   Medication Sig Start Date End Date Taking? Authorizing Provider  acetaminophen (TYLENOL) 500 MG tablet Take 500 mg by mouth 2 (two) times daily as needed for moderate pain.   Yes [provider]  aspirin 81 MG EC tablet Take 81 mg by mouth daily. Swallow whole.   Yes [provider]  Calcium Carb-Cholecalciferol (CALCIUM 600/VITAMIN D  PO) Take 1 tablet by mouth daily.   Yes [provider]  Cyanocobalamin (VITAMIN B-12 PO) Take 1 capsule by mouth daily.   Yes [provider]  diphenhydrAMINE (BENADRYL) 25 MG tablet Take 25 mg by mouth every 6 (six) hours as needed for itching.   Yes [provider]  dorzolamide (TRUSOPT) 2 % ophthalmic solution Place 1 drop into both eyes 2 (two) times daily. 01/29/22  Yes Rankin, Alford Highland, MD  lisinopril-hydrochlorothiazide (ZESTORETIC) 20-12.5 MG tablet TAKE ONE (1) TABLET BY MOUTH EVERY DAY Patient taking differently: Take 1 tablet by mouth  daily. 07/02/22  Yes Gabriel Earing, FNP  mirabegron ER (MYRBETRIQ) 50 MG TB24 tablet Take 50 mg by mouth daily.   Yes [provider]  Polyethyl Glycol-Propyl Glycol (SYSTANE OP) Place 1 drop into both eyes 3 (three) times daily as needed (itching and dry eyes).   Yes [provider]  rosuvastatin (CRESTOR) 10 MG tablet TAKE ONE (1) TABLET BY MOUTH EVERY DAY Patient taking differently: Take 10 mg by mouth daily. 11/19/22  Yes Gabriel Earing, FNP  Travoprost, BAK Free, (TRAVATAN Z) 0.004 % SOLN ophthalmic solution Place 1 drop into both eyes at bedtime. 01/29/22 01/30/23 Yes Rankin, Alford Highland, MD  triamcinolone (KENALOG) 0.025 % ointment Apply 1 Application topically 2 (two) times daily. Patient taking differently: Apply 1 Application topically continuous. 11/19/22  Yes Hawks, Christy A, FNP  mirabegron ER (MYRBETRIQ) 25 MG TB24 tablet Take 1 tablet (25 mg total) by mouth daily. Patient not taking: Reported on 11/23/2022 07/02/22   Gabriel Earing, FNP    Inpatient Medications: Scheduled Meds:  Chlorhexidine Gluconate Cloth  6 each Topical Daily   dorzolamide  1 drop Both Eyes BID   latanoprost  1 drop Both Eyes QHS   lisinopril  20 mg Oral Daily   pantoprazole (PROTONIX) IV  40 mg Intravenous Q24H   sodium chloride flush  3 mL Intravenous Q12H   Continuous Infusions:  0.9 % NaCl with KCl 40 mEq / L 75 mL/hr at 11/24/22 0608   piperacillin-tazobactam (ZOSYN)  IV 12.5 mL/hr at 11/24/22 0608   PRN Meds: diphenhydrAMINE, fentaNYL (SUBLIMAZE) injection, prochlorperazine  Allergies:    Allergies  Allergen Reactions   Ciprofloxacin Itching    No rash, just itching.    Lipitor [Atorvastatin Calcium] Other (See Comments)    Unknown    Septra [Bactrim] Other (See Comments)    Gi upset    Sulfa Antibiotics Other (See Comments)    Gi upset     Social History:   Social History   Socioeconomic History   Marital status: Widowed    Spouse name: Not on file   Number of  children: 2   Years of education: 7   Highest education level: 7th grade  Occupational History   Occupation: Retired  Tobacco Use   Smoking status: Never   Smokeless tobacco: Never  Vaping Use   Vaping Use: Never used  Substance and Sexual Activity   Alcohol use: No   Drug use: No   Sexual activity: Not Currently  Other Topics Concern   Not on file  Social History Narrative   Her daughter lives with her; She has 2 dogs she takes care of.   Social Determinants of Health   Financial Resource Strain: Low Risk  (10/07/2022)   Overall Financial Resource Strain (CARDIA)    Difficulty of Paying Living Expenses: Not hard at all  Food Insecurity: No Food Insecurity (11/23/2022)  Hunger Vital Sign    Worried About Running Out of Food in the Last Year: Never true    Ran Out of Food in the Last Year: Never true  Transportation Needs: No Transportation Needs (11/23/2022)   PRAPARE - Administrator, Civil Service (Medical): No    Lack of Transportation (Non-Medical): No  Physical Activity: Insufficiently Active (10/07/2022)   Exercise Vital Sign    Days of Exercise per Week: 3 days    Minutes of Exercise per Session: 30 min  Stress: No Stress Concern Present (10/07/2022)   [employer] of Occupational Health - Occupational Stress Questionnaire    Feeling of Stress : Not at all  Social Connections: Socially Isolated (10/07/2022)   Social Connection and Isolation Panel [NHANES]    Frequency of Communication with Friends and Family: More than three times a week    Frequency of Social Gatherings with Friends and Family: More than three times a week    Attends Religious Services: Never    Database administrator or Organizations: No    Attends Banker Meetings: Never    Marital Status: Divorced  Catering manager Violence: Not At Risk (11/23/2022)   Humiliation, Afraid, Rape, and Kick questionnaire    Fear of Current or Ex-Partner: No    Emotionally Abused: No     Physically Abused: No    Sexually Abused: No    Family History:    Family History  Problem Relation Age of Onset   Stroke Mother    Cancer Father    Cancer Sister    Diabetes Brother    Hyperlipidemia Brother    Hyperlipidemia Brother      ROS:  Please see the history of present illness.   All other ROS reviewed and negative.     Physical Exam/Data:   Vitals:   11/24/22 0400 11/24/22 0403 11/24/22 0500 11/24/22 0600  BP: (!) 171/80  (!) 164/70 (!) 153/67  Pulse: 63 64 80 61  Resp: 14 15 15 15   Temp:  98 F (36.7 C)    TempSrc:  Oral    SpO2: 94% 97% 95% 94%  Weight:  62.8 kg    Height:        Intake/Output Summary (Last 24 hours) at 11/24/2022 0745 Last data filed at 11/24/2022 1610 Gross per 24 hour  Intake 2747.84 ml  Output 1450 ml  Net 1297.84 ml      11/24/2022    4:03 AM 11/23/2022   10:20 AM 11/23/2022    9:35 AM  Last 3 Weights  Weight (lbs) 138 lb 7.2 oz 127 lb 13.9 oz 128 lb 12 oz  Weight (kg) 62.8 kg 58 kg 58.4 kg     Body mass index is 21.68 kg/m.  General:  Thin, elderly female. Laying flat in the bed in no acute distress.  HEENT: normal Neck: no JVD Vascular: Radial pulses 2+ bilaterally Cardiac:  normal S1, S2; RRR; soft systolic murmur at RUSB  Lungs:  clear to auscultation bilaterally, no wheezing, rhonchi or rales. Normal work of breathing on room air   Abd: soft, mildly tender to palpation  Ext: no edema in BLE  Musculoskeletal:  No deformities, BUE and BLE strength normal and equal Skin: warm and dry  Neuro:  CNs 2-12 intact, no focal abnormalities noted Psych:  Normal affect   EKG:  The EKG was personally reviewed and demonstrates:  Sinsu rhythm with frequent PACs, HR 84 BPM  Telemetry:  Telemetry was  personally reviewed and demonstrates:  predominantly normal sinus rhythm with frequent PACs. No true atrial fibrillation noted. Low voltage QRS complexes with artifact   Relevant CV Studies:   Laboratory Data:  High Sensitivity  Troponin:   Recent Labs  Lab 11/22/22 2325 11/23/22 0155  TROPONINIHS 40* 48*     Chemistry Recent Labs  Lab 11/22/22 2325 11/23/22 0750 11/23/22 1557 11/24/22 0327 11/24/22 0413  NA 126* 127* 130* 132*  --   K 2.0* 2.5* 3.1* 3.2*  --   CL 90* 102 99 103  --   CO2 24 21* 21* 21*  --   GLUCOSE 132* 98 145* 120*  --   BUN 17 15 15 12   --   CREATININE 1.31* 1.13* 1.12* 0.93  --   CALCIUM 8.1* 6.9* 7.6* 7.8*  --   MG 1.6*  --   --   --  1.5*  GFRNONAA 41* 49* 49* >60  --   ANIONGAP 12 4* 10 8  --     Recent Labs  Lab 11/22/22 2325 11/23/22 0750 11/24/22 0327  PROT 5.9* 5.6* 5.2*  ALBUMIN 2.3* 2.1* 2.0*  AST 299* 314* 310*  ALT 119* 117* 125*  ALKPHOS 641* 574* 594*  BILITOT 9.3* 8.3* 8.0*   Lipids No results for input(s): "CHOL", "TRIG", "HDL", "LABVLDL", "LDLCALC", "CHOLHDL" in the last 168 hours.  Hematology Recent Labs  Lab 11/22/22 2325 11/23/22 0750 11/24/22 0327  WBC 10.2 10.4 7.7  RBC 3.89 3.57* 3.32*  3.26*  HGB 11.2* 10.1* 9.5*  HCT 30.1* 28.4* 26.8*  MCV 77.4* 79.6* 80.7  MCH 28.8 28.3 28.6  MCHC 37.2* 35.6 35.4  RDW 15.5 15.7* 15.7*  PLT 299 267 263   Thyroid  Recent Labs  Lab 11/22/22 2325  TSH 2.133    BNPNo results for input(s): "BNP", "PROBNP" in the last 168 hours.  DDimer No results for input(s): "DDIMER" in the last 168 hours.   Radiology/Studies:  MR ABDOMEN MRCP W WO CONTAST  Result Date: 11/23/2022 CLINICAL DATA:  Inpatient. Biliary obstruction with abnormal pancreas on CT. EXAM: MRI ABDOMEN WITHOUT AND WITH CONTRAST (INCLUDING MRCP) TECHNIQUE: Multiplanar multisequence MR imaging of the abdomen was performed both before and after the administration of intravenous contrast. Heavily T2-weighted images of the biliary and pancreatic ducts were obtained, and three-dimensional MRCP images were rendered by post processing. CONTRAST:  5mL GADAVIST GADOBUTROL 1 MMOL/ML IV SOLN COMPARISON:  11/23/2022 CT abdomen/pelvis. FINDINGS: Lower  chest: No acute abnormality at the lung bases. Hepatobiliary: Normal liver size and configuration. No hepatic steatosis. No liver mass. Severe diffuse intrahepatic biliary ductal dilatation. Distended gallbladder (5.6 cm diameter). No significant pericholecystic fluid. Borderline mild diffuse gallbladder wall thickening. Marked dilatation of the common bile duct measuring 25 mm diameter proximally, with abrupt caliber transition at the level of the pancreatic head compatible with malignant biliary stricture. No choledocholithiasis. Pancreas: Enlarged pancreatic head with suspected poorly marginated heterogeneously enhancing 3.8 x 3.8 x 5.0 cm pancreatic head mass (series 20/image 55), best delineated on the high B value diffusion sequence (series 7/image 29). Marked dilatation of the pancreatic duct (14 mm diameter) and pancreatic duct side branches with advanced pancreatic parenchymal atrophy throughout the pancreatic body and tail. Spleen: Normal size. No mass. Adrenals/Urinary Tract: Normal adrenals. No overt hydronephrosis. Simple 1.1 cm posterior upper right renal cyst, for which no follow-up imaging is recommended. No suspicious renal masses. Stomach/Bowel: Normal non-distended stomach. Visualized small and large bowel is normal caliber, with no bowel wall thickening.  Vascular/Lymphatic: Atherosclerotic nonaneurysmal abdominal aorta. Patent hepatic, portal, splenic and renal veins. The SMA and celiac trunk appear uninvolved by tumor. No pathologically enlarged lymph nodes in the abdomen. Other: No abdominal ascites or focal fluid collection. Musculoskeletal: No aggressive appearing focal osseous lesions. Mild to moderate dextrocurvature of the lumbar spine with associated advanced lower lumbar degenerative disc disease. IMPRESSION: 1. 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 Enlarged pancreatic head with suspected poorly marginated heterogeneously enhancing 3.8 x  3.8 x 5.0 cm pancreatic head mass. Marked dilatation of the common bile duct and pancreatic duct with advanced pancreatic parenchymal atrophy throughout the pancreatic body and tail. Severe diffuse intrahepatic biliary ductal dilatation. 2. No evidence of metastatic disease in the abdomen. 3. No appreciable vascular involvement by the suspected pancreatic head neoplasm. Electronically Signed   By: Delbert Phenix M.D.   On: 11/23/2022 12:23   MR 3D Recon At Scanner  Result Date: 11/23/2022 CLINICAL DATA:  Inpatient. Biliary obstruction with abnormal pancreas on CT. EXAM: MRI ABDOMEN WITHOUT AND WITH CONTRAST (INCLUDING MRCP) TECHNIQUE: Multiplanar multisequence MR imaging of the abdomen was performed both before and after the administration of intravenous contrast. Heavily T2-weighted images of the biliary and pancreatic ducts were obtained, and three-dimensional MRCP images were rendered by post processing. CONTRAST:  5mL GADAVIST GADOBUTROL 1 MMOL/ML IV SOLN COMPARISON:  11/23/2022 CT abdomen/pelvis. FINDINGS: Lower chest: No acute abnormality at the lung bases. Hepatobiliary: Normal liver size and configuration. No hepatic steatosis. No liver mass. Severe diffuse intrahepatic biliary ductal dilatation. Distended gallbladder (5.6 cm diameter). No significant pericholecystic fluid. Borderline mild diffuse gallbladder wall thickening. Marked dilatation of the common bile duct measuring 25 mm diameter proximally, with abrupt caliber transition at the level of the pancreatic head compatible with malignant biliary stricture. No choledocholithiasis. Pancreas: Enlarged pancreatic head with suspected poorly marginated heterogeneously enhancing 3.8 x 3.8 x 5.0 cm pancreatic head mass (series 20/image 55), best delineated on the high B value diffusion sequence (series 7/image 29). Marked dilatation of the pancreatic duct (14 mm diameter) and pancreatic duct side branches with advanced pancreatic parenchymal atrophy  throughout the pancreatic body and tail. Spleen: Normal size. No mass. Adrenals/Urinary Tract: Normal adrenals. No overt hydronephrosis. Simple 1.1 cm posterior upper right renal cyst, for which no follow-up imaging is recommended. No suspicious renal masses. Stomach/Bowel: Normal non-distended stomach. Visualized small and large bowel is normal caliber, with no bowel wall thickening. Vascular/Lymphatic: Atherosclerotic nonaneurysmal abdominal aorta. Patent hepatic, portal, splenic and renal veins. The SMA and celiac trunk appear uninvolved by tumor. No pathologically enlarged lymph nodes in the abdomen. Other: No abdominal ascites or focal fluid collection. Musculoskeletal: No aggressive appearing focal osseous lesions. Mild to moderate dextrocurvature of the lumbar spine with associated advanced lower lumbar degenerative disc disease. IMPRESSION: 1. 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 Enlarged pancreatic head with suspected poorly marginated heterogeneously enhancing 3.8 x 3.8 x 5.0 cm pancreatic head mass. Marked dilatation of the common bile duct and pancreatic duct with advanced pancreatic parenchymal atrophy throughout the pancreatic body and tail. Severe diffuse intrahepatic biliary ductal dilatation. 2. No evidence of metastatic disease in the abdomen. 3. No appreciable vascular involvement by the suspected pancreatic head neoplasm. Electronically Signed   By: Delbert Phenix M.D.   On: 11/23/2022 12:23   CT ABDOMEN PELVIS W CONTRAST  Result Date: 11/23/2022 CLINICAL DATA:  82 year old female with syncope. Suspected biliary obstruction. EXAM: CT ABDOMEN AND PELVIS WITH CONTRAST TECHNIQUE: Multidetector CT imaging  of the abdomen and pelvis was performed using the standard protocol following bolus administration of intravenous contrast. RADIATION DOSE REDUCTION: This exam was performed according to the departmental dose-optimization program which includes  automated exposure control, adjustment of the mA and/or kV according to patient size and/or use of iterative reconstruction technique. CONTRAST:  OMNIPAQUE IOHEXOL 300 MG/ML  SOLN COMPARISON:  Lumbar MRI 02/21/2021. FINDINGS: Lower chest: Calcified coronary artery atherosclerosis. Mild cardiomegaly. No pericardial effusion. No pleural effusion. Symmetric lung base atelectasis. Additionally, small indeterminate 3 mm right lower lobe lung nodule series 6, image 1. Hepatobiliary: Severe diffuse dilatation of the biliary system. The gallbladder is hydropic, 16 cm in length. Diffuse severe intrahepatic and extrahepatic biliary ductal enlargement, the CBD is 24 mm diameter. And there is abnormal abrupt tapering and enhancement of the distal CBD (bird beak appearance series 8, image 44). The distal duct appears abnormally thickened and enhancing over a segment of up to 3 cm to the papilla. See coronal images 39 through 44. No superimposed discrete hepatic parenchymal mass. No definite porta hepatis lymphadenopathy. Pancreas: Diffusely abnormal pancreatic parenchyma with extensive multi cystic dilatation of the ductal system and subsequent parenchymal atrophy. The main pancreatic duct is difficult to identify, and the pancreatic head at the papilla is heterogeneous with loss of normal architecture in an area of about 4 cm on coronal image 41. However, there is no obvious tumor in case mint of vessels, lymphadenopathy, or pancreatic active inflammation. Spleen: Diminutive, negative. Adrenals/Urinary Tract: Adrenal glands and kidneys remain within normal limits. Symmetric renal contrast excretion on the delayed images. Numerous pelvic phleboliths. Unremarkable bladder. Stomach/Bowel: Mild large bowel retained stool. Decompressed distal transverse colon. No convincing large bowel inflammation. No dilated small bowel. Decompressed stomach. Duodenum contains only a small volume of fluid. No free air or free fluid.  Vascular/Lymphatic: Aortoiliac calcified atherosclerosis. Major arterial structures are patent. Normal caliber abdominal aorta. Portal venous system is patent. There is no convincing vascular in case mint in the region of the pancreas or retroperitoneum. And no malignant lymphadenopathy identified. Reproductive: Within normal limits. Other: No pelvis free fluid. Musculoskeletal: No pelvis free fluid. Numerous pelvis phleboliths. No acute or suspicious osseous lesion identified. IMPRESSION: 1. Very severe obstructive biliary disease. Etiology appears to be diffuse abnormal thickening and enhancement of the distal 2-3 cm of the CBD contiguous with the Papilla (coronal images 39 to 44). CBD dilated up to 24 mm diameter, and virtually no normal pancreatic parenchyma. Papillary and/or Pancreatic Head Carcinoma not excluded. However, there is no regional vascular encasement, no convincing lymphadenopathy or regional metastatic disease. 2. Outside of the heterogeneous pancreatic head there is diffuse multi-cystic replacement and atrophy of the normal pancreas. And Severe intrahepatic biliary dilatation with Gallbladder Hydrops all secondary to #1. 3. Small indeterminate right lower lobe pulmonary nodule, follow-up to be determined by presence or absence of malignancy and #1. Electronically Signed   By: Odessa Fleming M.D.   On: 11/23/2022 05:47   CT Head Wo Contrast  Result Date: 11/23/2022 CLINICAL DATA:  82 year old female with syncope. Suspected biliary obstruction. EXAM: CT HEAD WITHOUT CONTRAST TECHNIQUE: Contiguous axial images were obtained from the base of the skull through the vertex without intravenous contrast. RADIATION DOSE REDUCTION: This exam was performed according to the departmental dose-optimization program which includes automated exposure control, adjustment of the mA and/or kV according to patient size and/or use of iterative reconstruction technique. COMPARISON:  None Available. FINDINGS: Brain: No  midline shift, mass effect, or evidence of intracranial  mass lesion. No ventriculomegaly. No acute intracranial hemorrhage identified. Confluent bilateral cerebral white matter hypodensity, including bilateral deep white matter capsule involvement. Basal ganglia and posterior fossa relatively spared. No cortical encephalomalacia identified. No cortically based acute infarct identified. Basal ganglia vascular calcifications are mild. Mass effect on the cervicomedullary junction appears related to chronic/congenital skull base changes described below. Vascular: No suspicious intracranial vascular hyperdensity. Calcified atherosclerosis at the skull base. 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 Some evidence of associated craniocervical and C1-C2 degeneration. No skull fracture, No acute osseous abnormality identified. Sinuses/Orbits: Tympanic cavities and mastoids are clear. Paranasal sinuses are well aerated with occasional small mucous retention cysts. Other: No acute orbit or scalp soft tissue finding. Partially visible bilateral parotid gland atrophy and sialolithiasis. IMPRESSION: 1. No acute intracranial abnormality. 2. Advanced cerebral white matter changes, most commonly due to chronic small vessel disease. 3. 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 4. Parotid gland atrophy and sialolithiasis. Electronically Signed   By: Odessa Fleming M.D.   On: 11/23/2022 05:10   DG Chest Port 1 View  Result Date: 11/23/2022 CLINICAL DATA:  Syncope EXAM: PORTABLE CHEST 1 VIEW COMPARISON:  02/10/2018 FINDINGS: The heart size and mediastinal contours are within normal limits. Both lungs are clear. The visualized skeletal structures are unremarkable. IMPRESSION: No active disease. Electronically Signed   By: Sharlet Salina M.D.   On: 11/23/2022 00:23     Assessment and Plan:   Irregular Heart  Rhythm  Prolonged QT  Hypokalemia  Hypomagnesia  - Patient noted to have an irregular heart rhythm on telemetry that was concerning for atrial fibrillation. Per telemetry and EKG, appears that patient is in normal sinus rhythm with frequent PACs  - K was 2.0 and mag 1.6 on admission. Has been supplemented, but K remains low at 3.2 and mag 1.5 this AM. Supplementation has been ordered per primary  - Initial EKG showed nsr with frequent PACs. Qtc 562 - Repeat EKG today after mag and K supplemented. Ectopy has decreased on telemetry with electrolyte repletion   Syncope  - Patient had an episode of syncope while trying to have a bowel movement on the toilet. Likely vasovagal given story  - Telemetry shows predominantly normal sinus rhythm with frequent ectopy  - Consider outpatient monitor at DC - Echocardiogram pending  - Patient is also being evaluated for obstructive jaundice with workup suggesting pancreatic head malignancy. Has low albumin and low total protein. 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

## 2022-11-24 NOTE — Progress Notes (Signed)
[facility] Progress Note  SUBJECTIVE:   Interval history: Priscilla Daniels was seen and evaluated today at bedside. MRCP results discussed with her at bedside, discussed concern for pancreatic head mass and that this is cancer. She is not interested in pursuing cancer treatment. I discussed completing ERCP procedure with her, not for tissue diagnosis, rather for stenting to relieve obstruction. She was not interested in ERCP procedure either. I discussed that next steps would typically involve requesting evaluation by palliative care and hospice, she was agreeable to this.   She currently has some epigastric abdominal discomfort and nausea. She also has been experiencing some diarrhea. She has long standing shortness of breath. No vomiting. She has lost roughly 10 lbs over the past 1 year.  Past Medical History:  Diagnosis Date   Abnormal vaginal Pap smear    Candidiasis    cuteneous    Chronic UTI    COPD (chronic obstructive pulmonary disease) (HCC) 2010   Dx by CXR    DM (diabetes mellitus) (HCC)    Fatigue    Glaucoma    Hemorrhage in optic nerve sheath of left eye 01/23/2020   Hyperlipidemia    Diet Controlled    NIDDM (non-insulin dependent diabetes mellitus)    Osteopenia 03/12/2021   Phlebitis    Strep pharyngitis    Vaginal atrophy    Vertigo    Past Surgical History:  Procedure Laterality Date   CERVICAL CONE BIOPSY     GLAUCOMA SURGERY  12/04 & 07/05/03   left eye cataract surgery  01/29/2009   right eye cataract surgery  01/13/09   VARICOSE VEIN SURGERY     both legs    Current Facility-Administered Medications  Medication Dose Route Frequency Provider Last Rate Last Admin   0.9 % NaCl with KCl 40 mEq / L  infusion   Intravenous Continuous Bobette Mo, MD 75 mL/hr at 11/24/22 0900 Infusion Verify at 11/24/22 0900   Chlorhexidine Gluconate Cloth 2 % PADS 6 each  6 each Topical Daily Bobette Mo, MD       diphenhydrAMINE (BENADRYL) capsule  25 mg  25 mg Oral Q6H PRN Bobette Mo, MD   25 mg at 11/24/22 0727   dorzolamide (TRUSOPT) 2 % ophthalmic solution 1 drop  1 drop Both Eyes BID Bobette Mo, MD   1 drop at 11/23/22 2144   fentaNYL (SUBLIMAZE) injection 25 mcg  25 mcg Intravenous Q2H PRN Bobette Mo, MD       latanoprost (XALATAN) 0.005 % ophthalmic solution 1 drop  1 drop Both Eyes QHS Bobette Mo, MD   1 drop at 11/23/22 2144   lisinopril (ZESTRIL) tablet 20 mg  20 mg Oral Daily Bobette Mo, MD   20 mg at 11/23/22 1322   magnesium sulfate IVPB 2 g 50 mL  2 g Intravenous Once Marinda Elk, MD       pantoprazole (PROTONIX) injection 40 mg  40 mg Intravenous Q24H Bobette Mo, MD   40 mg at 11/23/22 1213   piperacillin-tazobactam (ZOSYN) IVPB 3.375 g  3.375 g Intravenous Q8H Bobette Mo, MD   Stopped at 11/24/22 0847   prochlorperazine (COMPAZINE) injection 5 mg  5 mg Intravenous Q4H PRN Bobette Mo, MD       sodium chloride flush (NS) 0.9 % injection 3 mL  3 mL Intravenous Q12H Bobette Mo, MD   3 mL at 11/23/22 2144   Allergies as of  11/22/2022 - Review Complete 11/22/2022  Allergen Reaction Noted   Ciprofloxacin Itching 11/19/2022   Lipitor [atorvastatin calcium]  11/11/2010   Septra [bactrim]  11/11/2010   Sulfa antibiotics  11/11/2010   Review of Systems:  Review of Systems  Constitutional:  Positive for weight loss.  Respiratory:  Positive for shortness of breath.   Gastrointestinal:  Positive for abdominal pain, diarrhea and nausea. Negative for vomiting.    OBJECTIVE:   Temp:  [97.9 F (36.6 C)-98.3 F (36.8 C)] 98.2 F (36.8 C) (06/04 0800) Pulse Rate:  [60-91] 65 (06/04 0900) Resp:  [12-37] 16 (06/04 0900) BP: (114-174)/(52-121) 174/77 (06/04 0900) SpO2:  [94 %-100 %] 95 % (06/04 0900) Weight:  [58 kg-62.8 kg] 62.8 kg (06/04 0403) Last BM Date : 11/23/22 Physical Exam Constitutional:      General: She is not in acute distress.     Appearance: She is not ill-appearing, toxic-appearing or diaphoretic.  Cardiovascular:     Rate and Rhythm: Bradycardia present.  Pulmonary:     Effort: No respiratory distress.     Breath sounds: Normal breath sounds.  Abdominal:     General: Bowel sounds are normal. There is no distension.     Palpations: Abdomen is soft.     Tenderness: There is abdominal tenderness (epigastric).  Musculoskeletal:     Right lower leg: No edema.     Left lower leg: No edema.  Skin:    General: Skin is warm and dry.     Coloration: Skin is jaundiced.  Neurological:     Mental Status: She is alert.     Labs: Recent Labs    11/22/22 2325 11/23/22 0750 11/24/22 0327  WBC 10.2 10.4 7.7  HGB 11.2* 10.1* 9.5*  HCT 30.1* 28.4* 26.8*  PLT 299 267 263   BMET Recent Labs    11/23/22 0750 11/23/22 1557 11/24/22 0327  NA 127* 130* 132*  K 2.5* 3.1* 3.2*  CL 102 99 103  CO2 21* 21* 21*  GLUCOSE 98 145* 120*  BUN 15 15 12   CREATININE 1.13* 1.12* 0.93  CALCIUM 6.9* 7.6* 7.8*   LFT Recent Labs    11/23/22 0750 11/24/22 0327  PROT 5.6* 5.2*  ALBUMIN 2.1* 2.0*  AST 314* 310*  ALT 117* 125*  ALKPHOS 574* 594*  BILITOT 8.3* 8.0*  BILIDIR 5.0*  --   IBILI 3.3*  --    PT/INR Recent Labs    11/22/22 2325  LABPROT 18.4*  INR 1.5*   Diagnostic imaging: MR ABDOMEN MRCP W WO CONTAST  Result Date: 11/23/2022 CLINICAL DATA:  Inpatient. Biliary obstruction with abnormal pancreas on CT. EXAM: MRI ABDOMEN WITHOUT AND WITH CONTRAST (INCLUDING MRCP) TECHNIQUE: Multiplanar multisequence MR imaging of the abdomen was performed both before and after the administration of intravenous contrast. Heavily T2-weighted images of the biliary and pancreatic ducts were obtained, and three-dimensional MRCP images were rendered by post processing. CONTRAST:  5mL GADAVIST GADOBUTROL 1 MMOL/ML IV SOLN COMPARISON:  11/23/2022 CT abdomen/pelvis. FINDINGS: Lower chest: No acute abnormality at the lung bases.  Hepatobiliary: Normal liver size and configuration. No hepatic steatosis. No liver mass. Severe diffuse intrahepatic biliary ductal dilatation. Distended gallbladder (5.6 cm diameter). No significant pericholecystic fluid. Borderline mild diffuse gallbladder wall thickening. Marked dilatation of the common bile duct measuring 25 mm diameter proximally, with abrupt caliber transition at the level of the pancreatic head compatible with malignant biliary stricture. No choledocholithiasis. Pancreas: Enlarged pancreatic head with suspected poorly marginated heterogeneously enhancing 3.8 x  3.8 x 5.0 cm pancreatic head mass (series 20/image 55), best delineated on the high B value diffusion sequence (series 7/image 29). Marked dilatation of the pancreatic duct (14 mm diameter) and pancreatic duct side branches with advanced pancreatic parenchymal atrophy throughout the pancreatic body and tail. Spleen: Normal size. No mass. Adrenals/Urinary Tract: Normal adrenals. No overt hydronephrosis. Simple 1.1 cm posterior upper right renal cyst, for which no follow-up imaging is recommended. No suspicious renal masses. Stomach/Bowel: Normal non-distended stomach. Visualized small and large bowel is normal caliber, with no bowel wall thickening. Vascular/Lymphatic: Atherosclerotic nonaneurysmal abdominal aorta. Patent hepatic, portal, splenic and renal veins. The SMA and celiac trunk appear uninvolved by tumor. No pathologically enlarged lymph nodes in the abdomen. Other: No abdominal ascites or focal fluid collection. Musculoskeletal: No aggressive appearing focal osseous lesions. Mild to moderate dextrocurvature of the lumbar spine with associated advanced lower lumbar degenerative disc disease. IMPRESSION: 1. 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 Enlarged pancreatic head with suspected poorly marginated heterogeneously enhancing 3.8 x 3.8 x 5.0 cm pancreatic head mass. Marked  dilatation of the common bile duct and pancreatic duct with advanced pancreatic parenchymal atrophy throughout the pancreatic body and tail. Severe diffuse intrahepatic biliary ductal dilatation. 2. No evidence of metastatic disease in the abdomen. 3. No appreciable vascular involvement by the suspected pancreatic head neoplasm. Electronically Signed   By: Delbert Phenix M.D.   On: 11/23/2022 12:23   MR 3D Recon At Scanner  Result Date: 11/23/2022 CLINICAL DATA:  Inpatient. Biliary obstruction with abnormal pancreas on CT. EXAM: MRI ABDOMEN WITHOUT AND WITH CONTRAST (INCLUDING MRCP) TECHNIQUE: Multiplanar multisequence MR imaging of the abdomen was performed both before and after the administration of intravenous contrast. Heavily T2-weighted images of the biliary and pancreatic ducts were obtained, and three-dimensional MRCP images were rendered by post processing. CONTRAST:  5mL GADAVIST GADOBUTROL 1 MMOL/ML IV SOLN COMPARISON:  11/23/2022 CT abdomen/pelvis. FINDINGS: Lower chest: No acute abnormality at the lung bases. Hepatobiliary: Normal liver size and configuration. No hepatic steatosis. No liver mass. Severe diffuse intrahepatic biliary ductal dilatation. Distended gallbladder (5.6 cm diameter). No significant pericholecystic fluid. Borderline mild diffuse gallbladder wall thickening. Marked dilatation of the common bile duct measuring 25 mm diameter proximally, with abrupt caliber transition at the level of the pancreatic head compatible with malignant biliary stricture. No choledocholithiasis. Pancreas: Enlarged pancreatic head with suspected poorly marginated heterogeneously enhancing 3.8 x 3.8 x 5.0 cm pancreatic head mass (series 20/image 55), best delineated on the high B value diffusion sequence (series 7/image 29). Marked dilatation of the pancreatic duct (14 mm diameter) and pancreatic duct side branches with advanced pancreatic parenchymal atrophy throughout the pancreatic body and tail. Spleen:  Normal size. No mass. Adrenals/Urinary Tract: Normal adrenals. No overt hydronephrosis. Simple 1.1 cm posterior upper right renal cyst, for which no follow-up imaging is recommended. No suspicious renal masses. Stomach/Bowel: Normal non-distended stomach. Visualized small and large bowel is normal caliber, with no bowel wall thickening. Vascular/Lymphatic: Atherosclerotic nonaneurysmal abdominal aorta. Patent hepatic, portal, splenic and renal veins. The SMA and celiac trunk appear uninvolved by tumor. No pathologically enlarged lymph nodes in the abdomen. Other: No abdominal ascites or focal fluid collection. Musculoskeletal: No aggressive appearing focal osseous lesions. Mild to moderate dextrocurvature of the lumbar spine with associated advanced lower lumbar degenerative disc disease. IMPRESSION: 1. 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 Enlarged pancreatic head with suspected poorly marginated heterogeneously enhancing 3.8 x 3.8 x 5.0 cm pancreatic  head mass. Marked dilatation of the common bile duct and pancreatic duct with advanced pancreatic parenchymal atrophy throughout the pancreatic body and tail. Severe diffuse intrahepatic biliary ductal dilatation. 2. No evidence of metastatic disease in the abdomen. 3. No appreciable vascular involvement by the suspected pancreatic head neoplasm. Electronically Signed   By: Delbert Phenix M.D.   On: 11/23/2022 12:23   CT ABDOMEN PELVIS W CONTRAST  Result Date: 11/23/2022 CLINICAL DATA:  82 year old female with syncope. Suspected biliary obstruction. EXAM: CT ABDOMEN AND PELVIS WITH CONTRAST TECHNIQUE: Multidetector CT imaging of the abdomen and pelvis was performed using the standard protocol following bolus administration of intravenous contrast. RADIATION DOSE REDUCTION: This exam was performed according to the departmental dose-optimization program which includes automated exposure control, adjustment of the mA  and/or kV according to patient size and/or use of iterative reconstruction technique. CONTRAST:  OMNIPAQUE IOHEXOL 300 MG/ML  SOLN COMPARISON:  Lumbar MRI 02/21/2021. FINDINGS: Lower chest: Calcified coronary artery atherosclerosis. Mild cardiomegaly. No pericardial effusion. No pleural effusion. Symmetric lung base atelectasis. Additionally, small indeterminate 3 mm right lower lobe lung nodule series 6, image 1. Hepatobiliary: Severe diffuse dilatation of the biliary system. The gallbladder is hydropic, 16 cm in length. Diffuse severe intrahepatic and extrahepatic biliary ductal enlargement, the CBD is 24 mm diameter. And there is abnormal abrupt tapering and enhancement of the distal CBD (bird beak appearance series 8, image 44). The distal duct appears abnormally thickened and enhancing over a segment of up to 3 cm to the papilla. See coronal images 39 through 44. No superimposed discrete hepatic parenchymal mass. No definite porta hepatis lymphadenopathy. Pancreas: Diffusely abnormal pancreatic parenchyma with extensive multi cystic dilatation of the ductal system and subsequent parenchymal atrophy. The main pancreatic duct is difficult to identify, and the pancreatic head at the papilla is heterogeneous with loss of normal architecture in an area of about 4 cm on coronal image 41. However, there is no obvious tumor in case mint of vessels, lymphadenopathy, or pancreatic active inflammation. Spleen: Diminutive, negative. Adrenals/Urinary Tract: Adrenal glands and kidneys remain within normal limits. Symmetric renal contrast excretion on the delayed images. Numerous pelvic phleboliths. Unremarkable bladder. Stomach/Bowel: Mild large bowel retained stool. Decompressed distal transverse colon. No convincing large bowel inflammation. No dilated small bowel. Decompressed stomach. Duodenum contains only a small volume of fluid. No free air or free fluid. Vascular/Lymphatic: Aortoiliac calcified atherosclerosis.  Major arterial structures are patent. Normal caliber abdominal aorta. Portal venous system is patent. There is no convincing vascular in case mint in the region of the pancreas or retroperitoneum. And no malignant lymphadenopathy identified. Reproductive: Within normal limits. Other: No pelvis free fluid. Musculoskeletal: No pelvis free fluid. Numerous pelvis phleboliths. No acute or suspicious osseous lesion identified. IMPRESSION: 1. Very severe obstructive biliary disease. Etiology appears to be diffuse abnormal thickening and enhancement of the distal 2-3 cm of the CBD contiguous with the Papilla (coronal images 39 to 44). CBD dilated up to 24 mm diameter, and virtually no normal pancreatic parenchyma. Papillary and/or Pancreatic Head Carcinoma not excluded. However, there is no regional vascular encasement, no convincing lymphadenopathy or regional metastatic disease. 2. Outside of the heterogeneous pancreatic head there is diffuse multi-cystic replacement and atrophy of the normal pancreas. And Severe intrahepatic biliary dilatation with Gallbladder Hydrops all secondary to #1. 3. Small indeterminate right lower lobe pulmonary nodule, follow-up to be determined by presence or absence of malignancy and #1. Electronically Signed   By: Odessa Fleming M.D.   On:  11/23/2022 05:47   CT Head Wo Contrast  Result Date: 11/23/2022 CLINICAL DATA:  82 year old female with syncope. Suspected biliary obstruction. EXAM: CT HEAD WITHOUT CONTRAST TECHNIQUE: Contiguous axial images were obtained from the base of the skull through the vertex without intravenous contrast. RADIATION DOSE REDUCTION: This exam was performed according to the departmental dose-optimization program which includes automated exposure control, adjustment of the mA and/or kV according to patient size and/or use of iterative reconstruction technique. COMPARISON:  None Available. FINDINGS: Brain: No midline shift, mass effect, or evidence of intracranial mass  lesion. No ventriculomegaly. No acute intracranial hemorrhage identified. Confluent bilateral cerebral white matter hypodensity, including bilateral deep white matter capsule involvement. Basal ganglia and posterior fossa relatively spared. No cortical encephalomalacia identified. No cortically based acute infarct identified. Basal ganglia vascular calcifications are mild. Mass effect on the cervicomedullary junction appears related to chronic/congenital skull base changes described below. Vascular: No suspicious intracranial vascular hyperdensity. Calcified atherosclerosis at the skull base. 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 Some evidence of associated craniocervical and C1-C2 degeneration. No skull fracture, No acute osseous abnormality identified. Sinuses/Orbits: Tympanic cavities and mastoids are clear. Paranasal sinuses are well aerated with occasional small mucous retention cysts. Other: No acute orbit or scalp soft tissue finding. Partially visible bilateral parotid gland atrophy and sialolithiasis. IMPRESSION: 1. No acute intracranial abnormality. 2. Advanced cerebral white matter changes, most commonly due to chronic small vessel disease. 3. 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 4. Parotid gland atrophy and sialolithiasis. Electronically Signed   By: Odessa Fleming M.D.   On: 11/23/2022 05:10   DG Chest Port 1 View  Result Date: 11/23/2022 CLINICAL DATA:  Syncope EXAM: PORTABLE CHEST 1 VIEW COMPARISON:  02/10/2018 FINDINGS: The heart size and mediastinal contours are within normal limits. Both lungs are clear. The visualized skeletal structures are unremarkable. IMPRESSION: No active disease. Electronically Signed   By: Sharlet Salina M.D.   On: 11/23/2022 00:23    IMPRESSION: Pancreatic head mass on MRCP, dilated common bile duct and pancreatic  duct Hyperbilirubinemia Elevated transaminase level in mixed pattern  COPD Hyperlipidemia Diabetes mellitus  PLAN: -Discussed concerns for pancreatic head malignancy with patient today, she verbalized understanding, she was not interested in cancer treatment nor endoscopic intervention to treat biliary obstruction related to mass effect (ERCP) -She noted that her family has opinions for her to pursue treatment, though she is not interested in pursuing -In this setting, would recommend involve palliative care and hospice -[facility] will sign off, will be available if patient wishes to pursue ERCP   LOS: 1 day   Liliane Shi, [facility]

## 2022-11-24 NOTE — Progress Notes (Signed)
  Echocardiogram 2D Echocardiogram has been performed.  Priscilla Daniels Wynn Banker 11/24/2022, 11:41 AM

## 2022-11-24 NOTE — Hospital Course (Addendum)
Priscilla Daniels is an 82 year old female with PMH COPD, DMII, HLD who presented originally to [facility] on 11/22/2022 due to loss of consciousness while on the commode.  She was transferred to [facility] for further workup.  Upon evaluation in the [facility] emergency department CT imaging of the abdomen pelvis revealed very severe obstructive biliary disease with enhancement of the distal 2 to 3 cm of the common bile duct with findings concerning for papillary and/or pancreatic head carcinoma.   She also developed new onset atrial fibrillation and was evaluated by cardiology during hospitalization.  Further workup for underlying pancreatic mass was also pursued with MRCP.  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

## 2022-11-25 DIAGNOSIS — K831 Obstruction of bile duct: Secondary | ICD-10-CM | POA: Diagnosis not present

## 2022-11-25 DIAGNOSIS — K8689 Other specified diseases of pancreas: Secondary | ICD-10-CM | POA: Diagnosis not present

## 2022-11-25 DIAGNOSIS — I4891 Unspecified atrial fibrillation: Secondary | ICD-10-CM | POA: Diagnosis not present

## 2022-11-25 DIAGNOSIS — Z7189 Other specified counseling: Secondary | ICD-10-CM

## 2022-11-25 DIAGNOSIS — L298 Other pruritus: Secondary | ICD-10-CM | POA: Diagnosis present

## 2022-11-25 DIAGNOSIS — N179 Acute kidney failure, unspecified: Secondary | ICD-10-CM | POA: Diagnosis not present

## 2022-11-25 LAB — CBC WITH DIFFERENTIAL/PLATELET
Abs Immature Granulocytes: 0.05 10*3/uL (ref 0.00–0.07)
Basophils Absolute: 0.1 10*3/uL (ref 0.0–0.1)
Basophils Relative: 1 %
Eosinophils Absolute: 0.3 10*3/uL (ref 0.0–0.5)
Eosinophils Relative: 3 %
HCT: 27.4 % — ABNORMAL LOW (ref 36.0–46.0)
Hemoglobin: 9.7 g/dL — ABNORMAL LOW (ref 12.0–15.0)
Immature Granulocytes: 1 %
Lymphocytes Relative: 18 %
Lymphs Abs: 1.6 10*3/uL (ref 0.7–4.0)
MCH: 28.5 pg (ref 26.0–34.0)
MCHC: 35.4 g/dL (ref 30.0–36.0)
MCV: 80.6 fL (ref 80.0–100.0)
Monocytes Absolute: 0.8 10*3/uL (ref 0.1–1.0)
Monocytes Relative: 9 %
Neutro Abs: 6.1 10*3/uL (ref 1.7–7.7)
Neutrophils Relative %: 68 %
Platelets: 239 10*3/uL (ref 150–400)
RBC: 3.4 MIL/uL — ABNORMAL LOW (ref 3.87–5.11)
RDW: 16 % — ABNORMAL HIGH (ref 11.5–15.5)
WBC: 9 10*3/uL (ref 4.0–10.5)
nRBC: 0 % (ref 0.0–0.2)

## 2022-11-25 LAB — GLUCOSE, CAPILLARY
Glucose-Capillary: 110 mg/dL — ABNORMAL HIGH (ref 70–99)
Glucose-Capillary: 115 mg/dL — ABNORMAL HIGH (ref 70–99)
Glucose-Capillary: 111 mg/dL — ABNORMAL HIGH (ref 70–99)
Glucose-Capillary: 136 mg/dL — ABNORMAL HIGH (ref 70–99)
Glucose-Capillary: 182 mg/dL — ABNORMAL HIGH (ref 70–99)

## 2022-11-25 LAB — COMPREHENSIVE METABOLIC PANEL
ALT: 126 U/L — ABNORMAL HIGH (ref 0–44)
AST: 279 U/L — ABNORMAL HIGH (ref 15–41)
Albumin: 1.9 g/dL — ABNORMAL LOW (ref 3.5–5.0)
Alkaline Phosphatase: 580 U/L — ABNORMAL HIGH (ref 38–126)
Anion gap: 8 (ref 5–15)
BUN: 8 mg/dL (ref 8–23)
CO2: 21 mmol/L — ABNORMAL LOW (ref 22–32)
Calcium: 7.9 mg/dL — ABNORMAL LOW (ref 8.9–10.3)
Chloride: 105 mmol/L (ref 98–111)
Creatinine, Ser: 0.66 mg/dL (ref 0.44–1.00)
GFR, Estimated: >60 mL/min (ref >60)
Glucose, Bld: 115 mg/dL — ABNORMAL HIGH (ref 70–99)
Potassium: 3.5 mmol/L (ref 3.5–5.1)
Sodium: 134 mmol/L — ABNORMAL LOW (ref 135–145)
Total Bilirubin: 8.2 mg/dL — ABNORMAL HIGH (ref 0.3–1.2)
Total Protein: 5.1 g/dL — ABNORMAL LOW (ref 6.5–8.1)

## 2022-11-25 LAB — MAGNESIUM: Magnesium: 1.6 mg/dL — ABNORMAL LOW (ref 1.7–2.4)

## 2022-11-25 LAB — HEMOGLOBIN A1C
Hgb A1c MFr Bld: 6.1 % — ABNORMAL HIGH (ref 4.8–5.6)
Mean Plasma Glucose: 128 mg/dL

## 2022-11-25 MED ORDER — ASPIRIN 81 MG PO TBEC
81.0000 mg | DELAYED_RELEASE_TABLET | Freq: Every day | ORAL | Status: DC
  Administered 2022-11-25 – 2022-11-26 (×2): 81 mg via ORAL
  Filled 2022-11-25 (×2): qty 1

## 2022-11-25 MED ORDER — HYDRALAZINE HCL 20 MG/ML IJ SOLN
10.0000 mg | Freq: Four times a day (QID) | INTRAMUSCULAR | Status: DC | PRN
  Administered 2022-11-25: 10 mg via INTRAVENOUS
  Filled 2022-11-25: qty 1

## 2022-11-25 MED ORDER — CHOLESTYRAMINE LIGHT 4 G PO PACK
4.0000 g | PACK | ORAL | Status: DC
  Administered 2022-11-25 – 2022-11-26 (×3): 4 g via ORAL
  Filled 2022-11-25 (×3): qty 1

## 2022-11-25 MED ORDER — OXYCODONE HCL 5 MG PO TABS: 10.0000 mg | ORAL_TABLET | ORAL | Status: DC | PRN

## 2022-11-25 MED ORDER — KCL-LACTATED RINGERS 20 MEQ/L IV SOLN
INTRAVENOUS | Status: DC
  Filled 2022-11-25: qty 1000

## 2022-11-25 MED ORDER — POTASSIUM CHLORIDE 2 MEQ/ML IV SOLN
INTRAVENOUS | Status: DC
  Filled 2022-11-25 (×9): qty 1000

## 2022-11-25 MED ORDER — PANTOPRAZOLE SODIUM 40 MG PO TBEC
40.0000 mg | DELAYED_RELEASE_TABLET | Freq: Every day | ORAL | Status: DC
  Administered 2022-11-26: 40 mg via ORAL
  Filled 2022-11-25: qty 1

## 2022-11-25 MED ORDER — OXYCODONE HCL 5 MG PO TABS: 5.0000 mg | ORAL_TABLET | ORAL | Status: DC | PRN

## 2022-11-25 MED ORDER — FENTANYL CITRATE PF 50 MCG/ML IJ SOSY: 25.0000 ug | PREFILLED_SYRINGE | INTRAMUSCULAR | Status: DC | PRN

## 2022-11-25 NOTE — Assessment & Plan Note (Addendum)
Lisinopril daily from home regimen

## 2022-11-25 NOTE — Assessment & Plan Note (Addendum)
-   Ongoing discussions held during hospitalization.  Patient aware of suspected malignancy diagnosis and confidently continues to decline further diagnostic workup nor any treatment that might be offered -CODE STATUS discussed on 11/25/2022 and patient has elected for DNR as well in anticipation of discharging home with hospice

## 2022-11-25 NOTE — TOC Progression Note (Addendum)
Transition of Care [facility]) - Progression Note    Patient Details  Name: Priscilla Daniels MRN: [id number] Date of Birth: May 13, 1941  [facility] CM/SW Contact  Howell Rucks, RN Phone Number: 11/25/2022, 8:17 AM  Clinical Narrative:   [facility] consult for Home Hospice, await Palliative Care Consult. TOC will continue to follow.   -10:33am Discussion with MD on unit, reports pt has decided on Home Hospice, Palliative Care consult cancelled. Met with pt at bedside to introduce role of TOC/NCM and review for dc needs, pt requested to call her dtr ([name]), NCM called pt's dtr [name], introduced role of TOC/NCM and to review for dc needs, no preference for Home Hospice, agreeable to [facility], dtr requesting to meet with [facility] at 3pm today in pt's room, rep-Shanita, notified pt/dtr agreeable to [facility] and dtr requesting to meet in pt's room today at 3pm. TOC will continue to follow.          Expected Discharge Plan and Services                                               Social Determinants of Health (SDOH) Interventions SDOH Screenings   Food Insecurity: No Food Insecurity (11/23/2022)  Housing: Low Risk  (11/23/2022)  Transportation Needs: No Transportation Needs (11/23/2022)  Utilities: Not At Risk (11/23/2022)  Alcohol Screen: Low Risk  (10/07/2022)  Depression (PHQ2-9): Low Risk  (10/07/2022)  Financial Resource Strain: Low Risk  (10/07/2022)  Physical Activity: Insufficiently Active (10/07/2022)  Social Connections: Socially Isolated (10/07/2022)  Stress: No Stress Concern Present (10/07/2022)  Tobacco Use: Low Risk  (11/23/2022)    Readmission Risk Interventions     No data to display

## 2022-11-25 NOTE — Assessment & Plan Note (Signed)
Brief period of atrial fibrillation upon initial evaluation Patient is already in sinus rhythm upon arrival to the medical floor Admitting provider discussed possibly initiating full dose anticoagulation with the patient and she declined; low benefit in anticoagulation at this point also

## 2022-11-25 NOTE — Assessment & Plan Note (Signed)
·   Please see assessment and plan above °

## 2022-11-25 NOTE — Assessment & Plan Note (Addendum)
-   replete as needed 

## 2022-11-25 NOTE — Assessment & Plan Note (Addendum)
Patient exhibiting evidence of acute kidney injury secondary to volume depletion Normalized with fluids

## 2022-11-25 NOTE — Assessment & Plan Note (Addendum)
Very slightly elevated troponin during ED workup without any associated chest pain or ST segment changes on EKG Unlikely to be plaque rupture Continuing home regimen of daily aspirin

## 2022-11-25 NOTE — Progress Notes (Signed)
Telemetry reviewed.  Normal sinus rhythm.  No SVT or A-fib.  Patient with plans to transition to home hospice.  Cardiology will sign off at this time.  Gerri Spore T. Flora Lipps, MD, [facility]  [facility]  [address] [place], [place] [address] [phone]  10:53 AM

## 2022-11-25 NOTE — Assessment & Plan Note (Addendum)
Possibly vasovagal as this occurred while patient was sitting on the commode at home Echocardiogram reveals preserved ejection fraction with grade 2 diastolic dysfunction and no significant wall motion or valvular abnormalities.

## 2022-11-25 NOTE — Consult Note (Signed)
  Daily Progress Note   Patient Name: Priscilla Daniels       Date: 11/25/2022 DOB: Aug 08, 1940  Age: 82 y.o. MRN#: [id number] Attending Physician: Lewie Chamber, MD Primary Care Physician: Gabriel Earing, FNP Admit Date: 11/22/2022 Length of Stay: 2 days  Discussed care with primary hospitalist today. Patient has already elected that she does not want to pursue further medical interventions for her likely pancreatic cancer. Patient already discussed with hospitalist today and is electing to go home with hospice. TOC assisting with coordination. As goals for medical care have already been determined, palliative medicine team consult has been canceled. Please reach out if further assistance needed from palliative medicine team. Thank you.   Alvester Morin, DO Palliative Care Provider PMT # [phone]

## 2022-11-25 NOTE — Plan of Care (Signed)
  Problem: Education: Goal: Knowledge of General Education information will improve Description: Including pain rating scale, medication(s)/side effects and non-pharmacologic comfort measures Outcome: Completed/Met   Problem: Health Behavior/Discharge Planning: Goal: Ability to manage health-related needs will improve Outcome: Progressing   Problem: Clinical Measurements: Goal: Ability to maintain clinical measurements within normal limits will improve Outcome: Adequate for Discharge Goal: Will remain free from infection Outcome: Adequate for Discharge Goal: Diagnostic test results will improve Outcome: Adequate for Discharge Goal: Respiratory complications will improve Outcome: Adequate for Discharge Goal: Cardiovascular complication will be avoided Outcome: Adequate for Discharge   Problem: Activity: Goal: Risk for activity intolerance will decrease Outcome: Adequate for Discharge   Problem: Nutrition: Goal: Adequate nutrition will be maintained Outcome: Adequate for Discharge   Problem: Coping: Goal: Level of anxiety will decrease Outcome: Progressing   Problem: Elimination: Goal: Will not experience complications related to bowel motility Outcome: Adequate for Discharge Goal: Will not experience complications related to urinary retention Outcome: Adequate for Discharge   Problem: Pain Managment: Goal: General experience of comfort will improve Outcome: Adequate for Discharge   Problem: Safety: Goal: Ability to remain free from injury will improve Outcome: Adequate for Discharge   Problem: Skin Integrity: Goal: Risk for impaired skin integrity will decrease Outcome: Adequate for Discharge   Problem: Education: Goal: Knowledge of condition and prescribed therapy will improve Outcome: Completed/Met   Problem: Cardiac: Goal: Will achieve and/or maintain adequate cardiac output Outcome: Adequate for Discharge   Problem: Physical Regulation: Goal:  Complications related to the disease process, condition or treatment will be avoided or minimized Outcome: Adequate for Discharge   Problem: Education: Goal: Ability to describe self-care measures that may prevent or decrease complications (Diabetes Survival Skills Education) will improve Outcome: Adequate for Discharge Goal: Individualized Educational Video(s) Outcome: Adequate for Discharge   Problem: Coping: Goal: Ability to adjust to condition or change in health will improve Outcome: Adequate for Discharge   Problem: Fluid Volume: Goal: Ability to maintain a balanced intake and output will improve Outcome: Adequate for Discharge   Problem: Health Behavior/Discharge Planning: Goal: Ability to identify and utilize available resources and services will improve Outcome: Adequate for Discharge Goal: Ability to manage health-related needs will improve Outcome: Adequate for Discharge   Problem: Metabolic: Goal: Ability to maintain appropriate glucose levels will improve Outcome: Adequate for Discharge   Problem: Nutritional: Goal: Maintenance of adequate nutrition will improve Outcome: Adequate for Discharge Goal: Progress toward achieving an optimal weight will improve Outcome: Adequate for Discharge   Problem: Skin Integrity: Goal: Risk for impaired skin integrity will decrease Outcome: Adequate for Discharge   Problem: Tissue Perfusion: Goal: Adequacy of tissue perfusion will improve Outcome: Adequate for Discharge

## 2022-11-25 NOTE — Assessment & Plan Note (Addendum)
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  Currently asymptomatic but will empirically send with some pain and nausea medications at discharge.  No need for inpatient palliative care evaluation TOC assistance for arranging home hospice and DME equipment

## 2022-11-25 NOTE — Assessment & Plan Note (Addendum)
Ongoing pruritus that patient has been suffering from for the past several weeks Attempts at using Benadryl and topical creams prescribed by previous providers have not been effective Is likely the patient's pruritus is secondary to cholestasis from the biliary obstruction. Will initiate cholestyramine twice daily which can be titrated upwards to achieve efficacy.

## 2022-11-25 NOTE — Progress Notes (Signed)
Civil engineer, contracting [facility]) Hospital Liaison Note  Referral received for patient/family interest in hospice at home. [facility] liaison spoke with patient's daughter [name] to confirm interest. Interest confirmed.   Plan is to discharge home tomorrow via EMS.   DME needs to include hospital bed, which has been ordered for delivery tomorrow.   Please send comfort medications/prescriptions home with patient at discharge.   Please call with any questions or concerns. Thank you  Dionicio Stall, [place] [facility] Liaison [phone]

## 2022-11-25 NOTE — Assessment & Plan Note (Addendum)
Hypovolemic hyponatremia Improving with intravenous volume resuscitation with gentle isotonic fluids

## 2022-11-25 NOTE — Progress Notes (Signed)
Progress Note    Priscilla Daniels   ZOX:[id number]  DOB: 07-Jan-1941  DOA: 11/22/2022     2 PCP: Priscilla Earing, FNP  Initial CC: syncope  Hospital Course: Priscilla Daniels is an 82 year old female with PMH COPD, DMII, HLD who presented originally to [facility] on 11/22/2022 due to loss of consciousness while on the commode.  She was transferred to [facility] for further workup.  Upon evaluation in the [facility] emergency department CT imaging of the abdomen pelvis revealed very severe obstructive biliary disease with enhancement of the distal 2 to 3 cm of the common bile duct with findings concerning for papillary and/or pancreatic head carcinoma.   She also developed new onset atrial fibrillation and was evaluated by cardiology during hospitalization.  Further workup for underlying pancreatic mass was also pursued with MRCP.  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  Patient resting in bed comfortably.  Obvious jaundice noted.  Denies any pain or nausea.  Confirms wishes are to go home with hospice at discharge.  She elected for DNR today as well after discussion.  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  Currently asymptomatic but will empirically send with some pain and nausea medications at discharge.  No need for inpatient palliative care evaluation TOC assistance for arranging home  hospice and DME equipment   Pancreatic mass Please see assessment and plan above.  AKI (acute kidney injury) (HCC)-resolved as of 11/25/2022 Patient exhibiting evidence of acute kidney injury secondary to volume depletion Normalized with fluids   Hyponatremia Hypovolemic hyponatremia Improving with intravenous volume resuscitation with gentle isotonic fluids  Atrial fibrillation, new onset (HCC) Brief period of atrial fibrillation upon initial evaluation Patient is already in sinus rhythm upon arrival to the medical floor Admitting provider discussed possibly initiating full dose anticoagulation with the patient and she declined Considering patient's poor prognosis I would agree it is unlikely the patient would benefit from full dose anticoagulation at this point.  Cholestatic pruritus Ongoing pruritus that patient has been suffering from for the past several weeks Attempts at using Benadryl and topical creams prescribed by previous providers have not been effective Is likely the patient's pruritus is secondary to cholestasis from the biliary obstruction. Will initiate cholestyramine twice daily which can be titrated upwards to achieve efficacy.  Elevated troponin level not due myocardial infarction Very slightly elevated troponin during ED workup without any associated chest pain or ST segment changes on EKG Unlikely to be plaque rupture Continuing home regimen of daily aspirin  Syncope-resolved as of 11/25/2022 Possibly vasovagal as this occurred while patient was sitting on the commode at home Echocardiogram reveals preserved ejection fraction with grade 2 diastolic dysfunction and no significant wall motion or valvular abnormalities.  Hypokalemia - replete as needed   Hypomagnesemia - replete as needed   Essential hypertension Lisinopril daily from home regimen  Goals of care, counseling/discussion - Ongoing discussions held during hospitalization.  Patient aware of  suspected malignancy diagnosis and confidently continues to  decline further diagnostic workup nor any treatment that might be offered -CODE STATUS discussed on 11/25/2022 and patient has elected for DNR as well in anticipation of discharging home with hospice    Old records reviewed in assessment of this patient  Antimicrobials:   DVT prophylaxis:  SCDs Start: 11/23/22 0719   Code Status:   Code Status: DNR  Mobility Assessment (last 72 hours)     Mobility Assessment     Row Name 11/25/22 0815 11/24/22 2045 11/24/22 1400       Does patient have an order for bedrest or is patient medically unstable No - Continue assessment No - Continue assessment No - Continue assessment     What is the highest level of mobility based on the progressive mobility assessment? Level 2 (Chairfast) - Balance while sitting on edge of bed and cannot stand Level 2 (Chairfast) - Balance while sitting on edge of bed and cannot stand Level 2 (Chairfast) - Balance while sitting on edge of bed and cannot stand     Is the above level different from baseline mobility prior to current illness? No - Consider discontinuing PT/OT No - Consider discontinuing PT/OT No - Consider discontinuing PT/OT              Barriers to discharge: none Disposition Plan:  Home with hospice Status is: Inpt  Objective: Blood pressure (!) 175/90, pulse 65, temperature 98.2 F (36.8 C), temperature source Oral, resp. rate 20, height 5\' 7"  (1.702 m), weight 60.3 kg, SpO2 98 %.  Examination:  Physical Exam Constitutional:      Appearance: Normal appearance.  HENT:     Head: Normocephalic and atraumatic.     Mouth/Throat:     Mouth: Mucous membranes are moist.  Eyes:     Extraocular Movements: Extraocular movements intact.  Cardiovascular:     Rate and Rhythm: Normal rate. Rhythm irregular.  Pulmonary:     Effort: Pulmonary effort is normal. No respiratory distress.     Breath sounds: Normal breath sounds. No wheezing.   Abdominal:     General: Bowel sounds are normal. There is no distension.     Palpations: Abdomen is soft. There is mass (palpable mass in right mid abdomen).     Tenderness: There is no abdominal tenderness.  Musculoskeletal:        General: Normal range of motion.     Cervical back: Normal range of motion and neck supple.  Skin:    General: Skin is warm and dry.     Coloration: Skin is jaundiced.  Neurological:     General: No focal deficit present.     Mental Status: She is alert.  Psychiatric:        Mood and Affect: Mood normal.        Behavior: Behavior normal.      Consultants:  GI  Procedures:    Data Reviewed: Results for orders placed or performed during the hospital encounter of 11/22/22 (from the past 24 hour(s))  Glucose, capillary     Status: Abnormal   Collection Time: 11/24/22  4:04 PM  Result Value Ref Range   Glucose-Capillary 124 (H) 70 - 99 mg/dL  Glucose, capillary     Status: Abnormal   Collection Time: 11/24/22  8:41 PM  Result Value Ref Range   Glucose-Capillary 152 (H) 70 - 99 mg/dL  CBC with Differential/Platelet     Status: Abnormal   Collection Time: 11/25/22  5:00 AM  Result Value Ref Range  WBC 9.0 4.0 - 10.5 K/uL   RBC 3.40 (L) 3.87 - 5.11 MIL/uL   Hemoglobin 9.7 (L) 12.0 - 15.0 g/dL   HCT 87.5 (L) 64.3 - 32.9 %   MCV 80.6 80.0 - 100.0 fL   MCH 28.5 26.0 - 34.0 pg   MCHC 35.4 30.0 - 36.0 g/dL   RDW 51.8 (H) 84.1 - 66.0 %   Platelets 239 150 - 400 K/uL   nRBC 0.0 0.0 - 0.2 %   Neutrophils Relative % 68 %   Neutro Abs 6.1 1.7 - 7.7 K/uL   Lymphocytes Relative 18 %   Lymphs Abs 1.6 0.7 - 4.0 K/uL   Monocytes Relative 9 %   Monocytes Absolute 0.8 0.1 - 1.0 K/uL   Eosinophils Relative 3 %   Eosinophils Absolute 0.3 0.0 - 0.5 K/uL   Basophils Relative 1 %   Basophils Absolute 0.1 0.0 - 0.1 K/uL   Immature Granulocytes 1 %   Abs Immature Granulocytes 0.05 0.00 - 0.07 K/uL  Comprehensive metabolic panel     Status: Abnormal    Collection Time: 11/25/22  5:00 AM  Result Value Ref Range   Sodium 134 (L) 135 - 145 mmol/L   Potassium 3.5 3.5 - 5.1 mmol/L   Chloride 105 98 - 111 mmol/L   CO2 21 (L) 22 - 32 mmol/L   Glucose, Bld 115 (H) 70 - 99 mg/dL   BUN 8 8 - 23 mg/dL   Creatinine, Ser 6.30 0.44 - 1.00 mg/dL   Calcium 7.9 (L) 8.9 - 10.3 mg/dL   Total Protein 5.1 (L) 6.5 - 8.1 g/dL   Albumin 1.9 (L) 3.5 - 5.0 g/dL   AST 160 (H) 15 - 41 U/L   ALT 126 (H) 0 - 44 U/L   Alkaline Phosphatase 580 (H) 38 - 126 U/L   Total Bilirubin 8.2 (H) 0.3 - 1.2 mg/dL   GFR, Estimated >10 >93 mL/min   Anion gap 8 5 - 15  Magnesium     Status: Abnormal   Collection Time: 11/25/22  5:00 AM  Result Value Ref Range   Magnesium 1.6 (L) 1.7 - 2.4 mg/dL  Glucose, capillary     Status: Abnormal   Collection Time: 11/25/22  6:43 AM  Result Value Ref Range   Glucose-Capillary 110 (H) 70 - 99 mg/dL  Glucose, capillary     Status: Abnormal   Collection Time: 11/25/22  7:42 AM  Result Value Ref Range   Glucose-Capillary 115 (H) 70 - 99 mg/dL  Glucose, capillary     Status: Abnormal   Collection Time: 11/25/22 11:33 AM  Result Value Ref Range   Glucose-Capillary 111 (H) 70 - 99 mg/dL    I have reviewed pertinent nursing notes, vitals, labs, and images as necessary. I have ordered labwork to follow up on as indicated.  I have reviewed the last notes from staff over past 24 hours. I have discussed patient's care plan and test results with nursing staff, CM/SW, and other staff as appropriate.  Time spent: Greater than 50% of the 55 minute visit was spent in counseling/coordination of care for the patient as laid out in the A&P.   LOS: 2 days   Lewie Chamber, MD [facility] 11/25/2022, 1:33 PM

## 2022-11-26 DIAGNOSIS — Z7189 Other specified counseling: Secondary | ICD-10-CM | POA: Diagnosis not present

## 2022-11-26 DIAGNOSIS — K8689 Other specified diseases of pancreas: Secondary | ICD-10-CM | POA: Diagnosis not present

## 2022-11-26 DIAGNOSIS — K831 Obstruction of bile duct: Secondary | ICD-10-CM | POA: Diagnosis not present

## 2022-11-26 LAB — GLUCOSE, CAPILLARY
Glucose-Capillary: 98 mg/dL (ref 70–99)
Glucose-Capillary: 105 mg/dL — ABNORMAL HIGH (ref 70–99)

## 2022-11-26 MED ORDER — HYDROXYZINE HCL 25 MG PO TABS: 25.0000 mg | ORAL_TABLET | Freq: Three times a day (TID) | ORAL | 0 refills | Status: DC | PRN

## 2022-11-26 MED ORDER — ONDANSETRON HCL 4 MG PO TABS: 4.0000 mg | ORAL_TABLET | Freq: Three times a day (TID) | ORAL | 1 refills | Status: DC | PRN

## 2022-11-26 MED ORDER — OXYCODONE HCL 5 MG PO TABS: 5.0000 mg | ORAL_TABLET | ORAL | 0 refills | Status: DC | PRN

## 2022-11-26 MED ORDER — LISINOPRIL 20 MG PO TABS: 20.0000 mg | ORAL_TABLET | Freq: Every day | ORAL | 3 refills | Status: DC

## 2022-11-26 MED ORDER — HYDROXYZINE HCL 25 MG PO TABS: 25.0000 mg | ORAL_TABLET | Freq: Three times a day (TID) | ORAL | Status: DC | PRN

## 2022-11-26 NOTE — Progress Notes (Addendum)
Civil engineer, contracting [facility]) Hospital Liaison Note:  Update on this referral:  Patient DME is scheduled for delivery this morning before noon.   Spoke to patient's daughter to support and answer further hospice related questions. Family has [facility] contact information and is encouraged to call as needed for concerns/issues. Per family, plan is to discharge home with hospice today via EMS if medically stable.   Please reach out to [facility] for any hospice related needs,  Roda Shutters, RN [facility] HLT [phone]   1530 Update: Patient daughter has confirmed DME delivery and that family is ready to receive patient in the home if medically stable for discharge. [facility] Referral Center has set up a hospice admission visit to set up on [facility] services for the patient at 10am 11/27/22 and family is aware.

## 2022-11-26 NOTE — Discharge Summary (Signed)
Physician Discharge Summary   Priscilla Daniels ZOX:[id number] DOB: 09-24-1940 DOA: 11/22/2022  PCP: Gabriel Earing, FNP  Admit date: 11/22/2022 Discharge date: 11/26/2022   Admitted From: Home Disposition:  Home with hospice Discharging physician: Lewie Chamber, MD Barriers to discharge: none  Recommendations at discharge: Adjust pain/nausea regimen if needed   Discharge Condition: stable CODE STATUS: DNR Diet recommendation:  Diet Orders (From admission, onward)     Start     Ordered   11/26/22 0000  Diet general        11/26/22 1355   11/24/22 1743  Diet regular Room service appropriate? Yes; Fluid consistency: Thin  Diet effective now       Question Answer Comment  Room service appropriate? Yes   Fluid consistency: Thin      11/24/22 1748            Hospital Course: Ms. Rafanan is an 82 year old female with PMH COPD, DMII, HLD who presented originally to [facility] on 11/22/2022 due to loss of consciousness while on the commode.  She was transferred to [facility] for further workup.  Upon evaluation in the [facility] emergency department CT imaging of the abdomen pelvis revealed very severe obstructive biliary disease with enhancement of the distal 2 to 3 cm of the common bile duct with findings concerning for papillary and/or pancreatic head carcinoma.   She also developed new onset atrial fibrillation and was evaluated by cardiology during hospitalization.  Further workup for underlying pancreatic mass was also pursued with MRCP.  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  Assessment and Plan: * Common bile duct (CBD) obstruction Biliary obstruction secondary to pancreatic head mass which is very likely to be malignant in origin Patient unfortunately is experiencing associated symptoms of mild abdominal pain, jaundice and cholestatic pruritus Despite multiple discussions with GI, she  continued to decline further workup for diagnostic purposes and specifically would not want any treatment as well Prognosis remains poor Patient clear with her wishes for discharging home with home hospice.  Currently asymptomatic but will empirically send with some pain and nausea medications at discharge.  No need for inpatient palliative care evaluation TOC assistance for arranging home hospice and DME equipment   Pancreatic mass Please see assessment and plan above.  AKI (acute kidney injury) (HCC)-resolved as of 11/25/2022 Patient exhibiting evidence of acute kidney injury secondary to volume depletion Normalized with fluids   Hyponatremia Hypovolemic hyponatremia Improved with IVF  Atrial fibrillation, new onset (HCC) Brief period of atrial fibrillation upon initial evaluation Patient is already in sinus rhythm upon arrival to the medical floor Admitting provider discussed possibly initiating full dose anticoagulation with the patient and she declined; low benefit in anticoagulation at this point also   Cholestatic pruritus - Suspect due to cholestasis - Continued on hydroxyzine and Benadryl at discharge -Does not appear to have had much benefit with cholestyramine challenge inpatient  Elevated troponin level not due myocardial infarction Very slightly elevated troponin during ED workup without any associated chest pain or ST segment changes on EKG Unlikely to be plaque rupture Continuing home regimen of daily aspirin  Syncope-resolved as of 11/25/2022 Possibly vasovagal as this occurred while patient was sitting on the commode at home Echocardiogram reveals preserved ejection fraction with grade 2 diastolic dysfunction and no significant wall motion or valvular abnormalities.  Hypokalemia - repleted   Hypomagnesemia - repleted   Essential hypertension Lisinopril daily from home regimen  Goals of care, counseling/discussion - Ongoing discussions held during  hospitalization.  Patient aware of suspected malignancy diagnosis and confidently continues to decline further diagnostic workup nor any treatment that might be offered -CODE STATUS discussed on 11/25/2022 and patient has elected for DNR as well in anticipation of discharging home with hospice    Principal Diagnosis: Common bile duct (CBD) obstruction  Discharge Diagnoses: Active Hospital Problems   Diagnosis Date Noted   Common bile duct (CBD) obstruction 11/23/2022    Priority: 1.   Pancreatic mass 11/24/2022    Priority: 2.   Hyponatremia 11/23/2022    Priority: 4.   Atrial fibrillation, new onset (HCC) 11/23/2022    Priority: 5.   Cholestatic pruritus 11/25/2022    Priority: 6.   Elevated troponin level not due myocardial infarction 11/24/2022    Priority: 7.   Hypokalemia 11/23/2022    Priority: 9.   Hypomagnesemia 11/24/2022    Priority: 10.   Essential hypertension 09/30/2012    Priority: 11.   Goals of care, counseling/discussion 11/25/2022    Priority: 12.   Pressure injury of skin 11/24/2022   Microcytic anemia 11/23/2022   Elevated troponin 11/23/2022   Hypophosphatemia 11/23/2022   Hypocalcemia 11/23/2022   Prolonged QT interval 11/23/2022   Prediabetes 03/12/2021   CKD (chronic kidney disease), stage III (HCC) 02/11/2015   Atherosclerosis of aorta (HCC) 02/08/2015   Glaucoma (increased eye pressure) 09/30/2012   Hyperlipidemia with target LDL less than 100 09/30/2012    Resolved Hospital Problems   Diagnosis Date Noted Date Resolved   AKI (acute kidney injury) (HCC) 11/24/2022 11/25/2022    Priority: 3.   Syncope 11/23/2022 11/25/2022    Priority: 8.     Discharge Instructions     Amb referral to [facility]   Complete by: As directed    Diet general   Complete by: As directed    No wound care   Complete by: As directed       Allergies as of 11/26/2022       Reactions   Ciprofloxacin Itching   No rash, just itching.    Lipitor [atorvastatin  Calcium] Other (See Comments)   Unknown    Septra [bactrim] Other (See Comments)   Gi upset    Sulfa Antibiotics Other (See Comments)   Gi upset         Medication List     STOP taking these medications    lisinopril-hydrochlorothiazide 20-12.5 MG tablet Commonly known as: ZESTORETIC   rosuvastatin 10 MG tablet Commonly known as: CRESTOR       TAKE these medications    acetaminophen 500 MG tablet Commonly known as: TYLENOL Take 500 mg by mouth 2 (two) times daily as needed for moderate pain.   aspirin EC 81 MG tablet Take 81 mg by mouth daily. Swallow whole.   CALCIUM 600/VITAMIN D PO Take 1 tablet by mouth daily.   diphenhydrAMINE 25 MG tablet Commonly known as: BENADRYL Take 25 mg by mouth every 6 (six) hours as needed for itching.   dorzolamide 2 % ophthalmic solution Commonly known as: TRUSOPT Place 1 drop into both eyes 2 (two) times daily.   hydrOXYzine 25 MG tablet Commonly known as: ATARAX Take 1 tablet (25 mg total) by mouth 3 (three) times daily as needed for itching.   lisinopril 20 MG tablet Commonly known as: ZESTRIL Take 1 tablet (20 mg total) by mouth daily. Start taking on: November 27, 2022   mirabegron ER 50 MG Tb24  tablet Commonly known as: MYRBETRIQ Take 50 mg by mouth daily. What changed: Another medication with the same name was removed. Continue taking this medication, and follow the directions you see here.   ondansetron 4 MG tablet Commonly known as: Zofran Take 1 tablet (4 mg total) by mouth every 8 (eight) hours as needed for nausea or vomiting.   oxyCODONE 5 MG immediate release tablet Commonly known as: Oxy IR/ROXICODONE Take 1 tablet (5 mg total) by mouth every 4 (four) hours as needed for moderate pain.   SYSTANE OP Place 1 drop into both eyes 3 (three) times daily as needed (itching and dry eyes).   Travoprost (BAK Free) 0.004 % Soln ophthalmic solution Commonly known as: Travatan Z Place 1 drop into both eyes at  bedtime.   triamcinolone 0.025 % ointment Commonly known as: KENALOG Apply 1 Application topically 2 (two) times daily. What changed: when to take this   VITAMIN B-12 PO Take 1 capsule by mouth daily.               Durable Medical Equipment  (From admission, onward)           Start     Ordered   11/25/22 0207  For home use only DME Hospital bed  Once       Question Answer Comment  Length of Need Lifetime   Patient has (list medical condition): pancreatic cancer   Bed type Semi-electric      11/25/22 0207   11/25/22 0207  For home use only DME Shower stool  Once        11/25/22 0207   11/25/22 0206  For home use only DME Bedside commode  Once       Question:  Patient needs a bedside commode to treat with the following condition  Answer:  Pancreatic cancer (HCC)   11/25/22 0207            Allergies  Allergen Reactions   Ciprofloxacin Itching    No rash, just itching.    Lipitor [Atorvastatin Calcium] Other (See Comments)    Unknown    Septra [Bactrim] Other (See Comments)    Gi upset    Sulfa Antibiotics Other (See Comments)    Gi upset     Consultations: Palliative care   Procedures:   Discharge Exam: BP (!) 149/92   Pulse 65   Temp 98.5 F (36.9 C) (Oral)   Resp 20   Ht 5\' 7"  (1.702 m)   Wt 60.3 kg   SpO2 100%   BMI 20.82 kg/m  Physical Exam Constitutional:      Appearance: Normal appearance.  HENT:     Head: Normocephalic and atraumatic.     Mouth/Throat:     Mouth: Mucous membranes are moist.  Eyes:     Extraocular Movements: Extraocular movements intact.  Cardiovascular:     Rate and Rhythm: Normal rate. Rhythm irregular.  Pulmonary:     Effort: Pulmonary effort is normal. No respiratory distress.     Breath sounds: Normal breath sounds. No wheezing.  Abdominal:     General: Bowel sounds are normal. There is no distension.     Palpations: Abdomen is soft. There is mass (palpable mass in right mid abdomen).     Tenderness:  There is no abdominal tenderness.  Musculoskeletal:        General: Normal range of motion.     Cervical back: Normal range of motion and neck supple.  Skin:  General: Skin is warm and dry.     Coloration: Skin is jaundiced.  Neurological:     General: No focal deficit present.     Mental Status: She is alert.  Psychiatric:        Mood and Affect: Mood normal.        Behavior: Behavior normal.      The results of significant diagnostics from this hospitalization (including imaging, microbiology, ancillary and laboratory) are listed below for reference.   Microbiology: Recent Results (from the past 240 hour(s))  MRSA Next Gen by PCR, Nasal     Status: None   Collection Time: 11/23/22  9:44 AM   Specimen: Nasal Mucosa; Nasal Swab  Result Value Ref Range Status   MRSA by PCR Next Gen NOT DETECTED NOT DETECTED Final    Comment: (NOTE) The GeneXpert MRSA Assay (FDA approved for NASAL specimens only), is one component of a comprehensive MRSA colonization surveillance program. It is not intended to diagnose MRSA infection nor to guide or monitor treatment for MRSA infections. Test performance is not FDA approved in patients less than 72 years old. Performed at [facility], [address] [place], [place] [address]      Labs: BNP (last 3 results) No results for input(s): "BNP" in the last 8760 hours. Basic Metabolic Panel: Recent Labs  Lab 11/22/22 2325 11/23/22 0750 11/23/22 1557 11/24/22 0327 11/24/22 0413 11/25/22 0500  NA 126* 127* 130* 132*  --  134*  K 2.0* 2.5* 3.1* 3.2*  --  3.5  CL 90* 102 99 103  --  105  CO2 24 21* 21* 21*  --  21*  GLUCOSE 132* 98 145* 120*  --  115*  BUN 17 15 15 12   --  8  CREATININE 1.31* 1.13* 1.12* 0.93  --  0.66  CALCIUM 8.1* 6.9* 7.6* 7.8*  --  7.9*  MG 1.6*  --   --   --  1.5* 1.6*  PHOS  --  1.9*  --   --   --   --    Liver Function Tests: Recent Labs  Lab 11/22/22 2325 11/23/22 0750 11/24/22 0327  11/25/22 0500  AST 299* 314* 310* 279*  ALT 119* 117* 125* 126*  ALKPHOS 641* 574* 594* 580*  BILITOT 9.3* 8.3* 8.0* 8.2*  PROT 5.9* 5.6* 5.2* 5.1*  ALBUMIN 2.3* 2.1* 2.0* 1.9*   Recent Labs  Lab 11/23/22 0645  LIPASE 87*   No results for input(s): "AMMONIA" in the last 168 hours. CBC: Recent Labs  Lab 11/22/22 2325 11/23/22 0750 11/24/22 0327 11/25/22 0500  WBC 10.2 10.4 7.7 9.0  NEUTROABS  --  8.5*  --  6.1  HGB 11.2* 10.1* 9.5* 9.7*  HCT 30.1* 28.4* 26.8* 27.4*  MCV 77.4* 79.6* 80.7 80.6  PLT 299 267 263 239   Cardiac Enzymes: No results for input(s): "CKTOTAL", "CKMB", "CKMBINDEX", "TROPONINI" in the last 168 hours. BNP: Invalid input(s): "POCBNP" CBG: Recent Labs  Lab 11/25/22 1133 11/25/22 1620 11/25/22 2053 11/26/22 0724 11/26/22 1138  GLUCAP 111* 136* 182* 98 105*   D-Dimer No results for input(s): "DDIMER" in the last 72 hours. Hgb A1c Recent Labs    11/24/22 0327  HGBA1C 6.1*   Lipid Profile No results for input(s): "CHOL", "HDL", "LDLCALC", "TRIG", "CHOLHDL", "LDLDIRECT" in the last 72 hours. Thyroid function studies No results for input(s): "TSH", "T4TOTAL", "T3FREE", "THYROIDAB" in the last 72 hours.  Invalid input(s): "FREET3" Anemia work up [employer]  11/24/22 0327  VITAMINB12 4,074*  FOLATE 12.2  FERRITIN 605*  TIBC 181*  IRON 98  RETICCTPCT 0.8   Urinalysis    Component Value Date/Time   COLORURINE AMBER (A) 11/23/2022 0042   APPEARANCEUR CLEAR 11/23/2022 0042   APPEARANCEUR Cloudy (A) 11/12/2022 1016   LABSPEC 1.008 11/23/2022 0042   PHURINE 6.0 11/23/2022 0042   GLUCOSEU NEGATIVE 11/23/2022 0042   HGBUR SMALL (A) 11/23/2022 0042   BILIRUBINUR SMALL (A) 11/23/2022 0042   BILIRUBINUR Negative 11/12/2022 1016   KETONESUR NEGATIVE 11/23/2022 0042   PROTEINUR 100 (A) 11/23/2022 0042   UROBILINOGEN negative 08/19/2015 1003   NITRITE NEGATIVE 11/23/2022 0042   LEUKOCYTESUR NEGATIVE 11/23/2022 0042   Sepsis  Labs Recent Labs  Lab 11/22/22 2325 11/23/22 0750 11/24/22 0327 11/25/22 0500  WBC 10.2 10.4 7.7 9.0   Microbiology Recent Results (from the past 240 hour(s))  MRSA Next Gen by PCR, Nasal     Status: None   Collection Time: 11/23/22  9:44 AM   Specimen: Nasal Mucosa; Nasal Swab  Result Value Ref Range Status   MRSA by PCR Next Gen NOT DETECTED NOT DETECTED Final    Comment: (NOTE) The GeneXpert MRSA Assay (FDA approved for NASAL specimens only), is one component of a comprehensive MRSA colonization surveillance program. It is not intended to diagnose MRSA infection nor to guide or monitor treatment for MRSA infections. Test performance is not FDA approved in patients less than 18 years old. Performed at [facility], [address], [place], [place] [address]     Procedures/Studies: ECHOCARDIOGRAM COMPLETE  Result Date: 11/24/2022    ECHOCARDIOGRAM REPORT   Patient Name:   HONOR ADRIANCE Date of Exam: 11/24/2022 Medical Rec #:  [id number]        Height:       67.0 in Accession #:    [id number]       Weight:       138.4 lb Date of Birth:  Jan 07, 1941         BSA:          1.730 m Patient Age:    82 years         BP:           177/72 mmHg Patient Gender: F                HR:           60 bpm. Exam Location:  Inpatient Procedure: 2D Echo, Cardiac Doppler and Color Doppler Indications:    Syncope  History:        Patient has no prior history of Echocardiogram examinations.                 Risk Factors:Dyslipidemia, Diabetes and Hypertension.  Sonographer:    Lucy Antigua Referring Phys: [phone] Druanne Bosques Jeziorski ORTIZ IMPRESSIONS  1. Left ventricular ejection fraction, by estimation, is 60 to 65%. The left ventricle has normal function. The left ventricle has no regional wall motion abnormalities. Left ventricular diastolic parameters are consistent with Grade II diastolic dysfunction (pseudonormalization).  2. Right ventricular systolic function is normal. The right ventricular  size is normal. There is normal pulmonary artery systolic pressure. The estimated right ventricular systolic pressure is 34.2 mmHg.  3. The mitral valve is normal in structure. Trivial mitral valve regurgitation. No evidence of mitral stenosis.  4. The aortic valve is normal in structure. Aortic valve regurgitation is mild. No aortic stenosis is present.  5. The inferior  vena cava is dilated in size with <50% respiratory variability, suggesting right atrial pressure of 15 mmHg. FINDINGS  Left Ventricle: Left ventricular ejection fraction, by estimation, is 60 to 65%. The left ventricle has normal function. The left ventricle has no regional wall motion abnormalities. The left ventricular internal cavity size was normal in size. There is  no left ventricular hypertrophy. Left ventricular diastolic parameters are consistent with Grade II diastolic dysfunction (pseudonormalization). Right Ventricle: The right ventricular size is normal. No increase in right ventricular wall thickness. Right ventricular systolic function is normal. There is normal pulmonary artery systolic pressure. The tricuspid regurgitant velocity is 2.19 m/s, and  with an assumed right atrial pressure of 15 mmHg, the estimated right ventricular systolic pressure is 34.2 mmHg. Left Atrium: Left atrial size was normal in size. Right Atrium: Right atrial size was normal in size. Pericardium: There is no evidence of pericardial effusion. Mitral Valve: The mitral valve is normal in structure. Trivial mitral valve regurgitation. No evidence of mitral valve stenosis. Tricuspid Valve: The tricuspid valve is normal in structure. Tricuspid valve regurgitation is mild . No evidence of tricuspid stenosis. Aortic Valve: The aortic valve is normal in structure. Aortic valve regurgitation is mild. No aortic stenosis is present. Aortic valve mean gradient measures 4.0 mmHg. Aortic valve peak gradient measures 7.1 mmHg. Aortic valve area, by VTI measures 2.87 cm.  Pulmonic Valve: The pulmonic valve was normal in structure. Pulmonic valve regurgitation is trivial. No evidence of pulmonic stenosis. Aorta: The aortic root is normal in size and structure. Venous: The inferior vena cava is dilated in size with less than 50% respiratory variability, suggesting right atrial pressure of 15 mmHg. IAS/Shunts: No atrial level shunt detected by color flow Doppler.  LEFT VENTRICLE PLAX 2D LVIDd:         4.70 cm     Diastology LVIDs:         2.60 cm     LV e' medial:    6.20 cm/s LV PW:         0.70 cm     LV E/e' medial:  15.5 LV IVS:        0.80 cm     LV e' lateral:   6.42 cm/s LVOT diam:     1.90 cm     LV E/e' lateral: 15.0 LV SV:         86 LV SV Index:   50 LVOT Area:     2.84 cm  LV Volumes (MOD) LV vol d, MOD A4C: 64.4 ml LV vol s, MOD A4C: 22.0 ml LV SV MOD A4C:     64.4 ml RIGHT VENTRICLE             IVC RV S prime:     15.20 cm/s  IVC diam: 2.80 cm TAPSE (M-mode): 2.3 cm LEFT ATRIUM             Index        RIGHT ATRIUM           Index LA Vol (A2C):   18.6 ml 10.75 ml/m  RA Area:     13.80 cm LA Vol (A4C):   25.4 ml 14.69 ml/m  RA Volume:   27.00 ml  15.61 ml/m LA Biplane Vol: 22.5 ml 13.01 ml/m  AORTIC VALVE AV Area (Vmax):    2.66 cm AV Area (Vmean):   2.53 cm AV Area (VTI):     2.87 cm AV Vmax:  133.00 cm/s AV Vmean:          88.200 cm/s AV VTI:            0.300 m AV Peak Grad:      7.1 mmHg AV Mean Grad:      4.0 mmHg LVOT Vmax:         125.00 cm/s LVOT Vmean:        78.600 cm/s LVOT VTI:          0.304 m LVOT/AV VTI ratio: 1.01  AORTA Ao Root diam: 3.20 cm Ao Asc diam:  3.00 cm MITRAL VALVE               TRICUSPID VALVE MV Area (PHT): 4.06 cm    TR Peak grad:   19.2 mmHg MV Decel Time: 187 msec    TR Vmax:        219.00 cm/s MV E velocity: 96.00 cm/s MV A velocity: 69.00 cm/s  SHUNTS MV E/A ratio:  1.39        Systemic VTI:  0.30 m                            Systemic Diam: 1.90 cm Aditya Sabharwal Electronically signed by Dorthula Nettles Signature  Date/Time: 11/24/2022/2:19:12 PM    Final    MR ABDOMEN MRCP W WO CONTAST  Result Date: 11/23/2022 CLINICAL DATA:  Inpatient. Biliary obstruction with abnormal pancreas on CT. EXAM: MRI ABDOMEN WITHOUT AND WITH CONTRAST (INCLUDING MRCP) TECHNIQUE: Multiplanar multisequence MR imaging of the abdomen was performed both before and after the administration of intravenous contrast. Heavily T2-weighted images of the biliary and pancreatic ducts were obtained, and three-dimensional MRCP images were rendered by post processing. CONTRAST:  5mL GADAVIST GADOBUTROL 1 MMOL/ML IV SOLN COMPARISON:  11/23/2022 CT abdomen/pelvis. FINDINGS: Lower chest: No acute abnormality at the lung bases. Hepatobiliary: Normal liver size and configuration. No hepatic steatosis. No liver mass. Severe diffuse intrahepatic biliary ductal dilatation. Distended gallbladder (5.6 cm diameter). No significant pericholecystic fluid. Borderline mild diffuse gallbladder wall thickening. Marked dilatation of the common bile duct measuring 25 mm diameter proximally, with abrupt caliber transition at the level of the pancreatic head compatible with malignant biliary stricture. No choledocholithiasis. Pancreas: Enlarged pancreatic head with suspected poorly marginated heterogeneously enhancing 3.8 x 3.8 x 5.0 cm pancreatic head mass (series 20/image 55), best delineated on the high B value diffusion sequence (series 7/image 29). Marked dilatation of the pancreatic duct (14 mm diameter) and pancreatic duct side branches with advanced pancreatic parenchymal atrophy throughout the pancreatic body and tail. Spleen: Normal size. No mass. Adrenals/Urinary Tract: Normal adrenals. No overt hydronephrosis. Simple 1.1 cm posterior upper right renal cyst, for which no follow-up imaging is recommended. No suspicious renal masses. Stomach/Bowel: Normal non-distended stomach. Visualized small and large bowel is normal caliber, with no bowel wall thickening.  Vascular/Lymphatic: Atherosclerotic nonaneurysmal abdominal aorta. Patent hepatic, portal, splenic and renal veins. The SMA and celiac trunk appear uninvolved by tumor. No pathologically enlarged lymph nodes in the abdomen. Other: No abdominal ascites or focal fluid collection. Musculoskeletal: No aggressive appearing focal osseous lesions. Mild to moderate dextrocurvature of the lumbar spine with associated advanced lower lumbar degenerative disc disease. IMPRESSION: 1. 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 Enlarged pancreatic head with suspected poorly marginated heterogeneously enhancing 3.8 x 3.8 x 5.0 cm pancreatic head mass. Marked dilatation of the common bile duct and pancreatic duct with advanced  pancreatic parenchymal atrophy throughout the pancreatic body and tail. Severe diffuse intrahepatic biliary ductal dilatation. 2. No evidence of metastatic disease in the abdomen. 3. No appreciable vascular involvement by the suspected pancreatic head neoplasm. Electronically Signed   By: Delbert Phenix M.D.   On: 11/23/2022 12:23   MR 3D Recon At Scanner  Result Date: 11/23/2022 CLINICAL DATA:  Inpatient. Biliary obstruction with abnormal pancreas on CT. EXAM: MRI ABDOMEN WITHOUT AND WITH CONTRAST (INCLUDING MRCP) TECHNIQUE: Multiplanar multisequence MR imaging of the abdomen was performed both before and after the administration of intravenous contrast. Heavily T2-weighted images of the biliary and pancreatic ducts were obtained, and three-dimensional MRCP images were rendered by post processing. CONTRAST:  5mL GADAVIST GADOBUTROL 1 MMOL/ML IV SOLN COMPARISON:  11/23/2022 CT abdomen/pelvis. FINDINGS: Lower chest: No acute abnormality at the lung bases. Hepatobiliary: Normal liver size and configuration. No hepatic steatosis. No liver mass. Severe diffuse intrahepatic biliary ductal dilatation. Distended gallbladder (5.6 cm diameter). No significant  pericholecystic fluid. Borderline mild diffuse gallbladder wall thickening. Marked dilatation of the common bile duct measuring 25 mm diameter proximally, with abrupt caliber transition at the level of the pancreatic head compatible with malignant biliary stricture. No choledocholithiasis. Pancreas: Enlarged pancreatic head with suspected poorly marginated heterogeneously enhancing 3.8 x 3.8 x 5.0 cm pancreatic head mass (series 20/image 55), best delineated on the high B value diffusion sequence (series 7/image 29). Marked dilatation of the pancreatic duct (14 mm diameter) and pancreatic duct side branches with advanced pancreatic parenchymal atrophy throughout the pancreatic body and tail. Spleen: Normal size. No mass. Adrenals/Urinary Tract: Normal adrenals. No overt hydronephrosis. Simple 1.1 cm posterior upper right renal cyst, for which no follow-up imaging is recommended. No suspicious renal masses. Stomach/Bowel: Normal non-distended stomach. Visualized small and large bowel is normal caliber, with no bowel wall thickening. Vascular/Lymphatic: Atherosclerotic nonaneurysmal abdominal aorta. Patent hepatic, portal, splenic and renal veins. The SMA and celiac trunk appear uninvolved by tumor. No pathologically enlarged lymph nodes in the abdomen. Other: No abdominal ascites or focal fluid collection. Musculoskeletal: No aggressive appearing focal osseous lesions. Mild to moderate dextrocurvature of the lumbar spine with associated advanced lower lumbar degenerative disc disease. IMPRESSION: 1. 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 Enlarged pancreatic head with suspected poorly marginated heterogeneously enhancing 3.8 x 3.8 x 5.0 cm pancreatic head mass. Marked dilatation of the common bile duct and pancreatic duct with advanced pancreatic parenchymal atrophy throughout the pancreatic body and tail. Severe diffuse intrahepatic biliary ductal dilatation. 2. No  evidence of metastatic disease in the abdomen. 3. No appreciable vascular involvement by the suspected pancreatic head neoplasm. Electronically Signed   By: Delbert Phenix M.D.   On: 11/23/2022 12:23   CT ABDOMEN PELVIS W CONTRAST  Result Date: 11/23/2022 CLINICAL DATA:  82 year old female with syncope. Suspected biliary obstruction. EXAM: CT ABDOMEN AND PELVIS WITH CONTRAST TECHNIQUE: Multidetector CT imaging of the abdomen and pelvis was performed using the standard protocol following bolus administration of intravenous contrast. RADIATION DOSE REDUCTION: This exam was performed according to the departmental dose-optimization program which includes automated exposure control, adjustment of the mA and/or kV according to patient size and/or use of iterative reconstruction technique. CONTRAST:  OMNIPAQUE IOHEXOL 300 MG/ML  SOLN COMPARISON:  Lumbar MRI 02/21/2021. FINDINGS: Lower chest: Calcified coronary artery atherosclerosis. Mild cardiomegaly. No pericardial effusion. No pleural effusion. Symmetric lung base atelectasis. Additionally, small indeterminate 3 mm right lower lobe lung nodule series 6, image 1. Hepatobiliary: Severe diffuse dilatation  of the biliary system. The gallbladder is hydropic, 16 cm in length. Diffuse severe intrahepatic and extrahepatic biliary ductal enlargement, the CBD is 24 mm diameter. And there is abnormal abrupt tapering and enhancement of the distal CBD (bird beak appearance series 8, image 44). The distal duct appears abnormally thickened and enhancing over a segment of up to 3 cm to the papilla. See coronal images 39 through 44. No superimposed discrete hepatic parenchymal mass. No definite porta hepatis lymphadenopathy. Pancreas: Diffusely abnormal pancreatic parenchyma with extensive multi cystic dilatation of the ductal system and subsequent parenchymal atrophy. The main pancreatic duct is difficult to identify, and the pancreatic head at the papilla is heterogeneous with  loss of normal architecture in an area of about 4 cm on coronal image 41. However, there is no obvious tumor in case mint of vessels, lymphadenopathy, or pancreatic active inflammation. Spleen: Diminutive, negative. Adrenals/Urinary Tract: Adrenal glands and kidneys remain within normal limits. Symmetric renal contrast excretion on the delayed images. Numerous pelvic phleboliths. Unremarkable bladder. Stomach/Bowel: Mild large bowel retained stool. Decompressed distal transverse colon. No convincing large bowel inflammation. No dilated small bowel. Decompressed stomach. Duodenum contains only a small volume of fluid. No free air or free fluid. Vascular/Lymphatic: Aortoiliac calcified atherosclerosis. Major arterial structures are patent. Normal caliber abdominal aorta. Portal venous system is patent. There is no convincing vascular in case mint in the region of the pancreas or retroperitoneum. And no malignant lymphadenopathy identified. Reproductive: Within normal limits. Other: No pelvis free fluid. Musculoskeletal: No pelvis free fluid. Numerous pelvis phleboliths. No acute or suspicious osseous lesion identified. IMPRESSION: 1. Very severe obstructive biliary disease. Etiology appears to be diffuse abnormal thickening and enhancement of the distal 2-3 cm of the CBD contiguous with the Papilla (coronal images 39 to 44). CBD dilated up to 24 mm diameter, and virtually no normal pancreatic parenchyma. Papillary and/or Pancreatic Head Carcinoma not excluded. However, there is no regional vascular encasement, no convincing lymphadenopathy or regional metastatic disease. 2. Outside of the heterogeneous pancreatic head there is diffuse multi-cystic replacement and atrophy of the normal pancreas. And Severe intrahepatic biliary dilatation with Gallbladder Hydrops all secondary to #1. 3. Small indeterminate right lower lobe pulmonary nodule, follow-up to be determined by presence or absence of malignancy and #1.  Electronically Signed   By: Odessa Fleming M.D.   On: 11/23/2022 05:47   CT Head Wo Contrast  Result Date: 11/23/2022 CLINICAL DATA:  82 year old female with syncope. Suspected biliary obstruction. EXAM: CT HEAD WITHOUT CONTRAST TECHNIQUE: Contiguous axial images were obtained from the base of the skull through the vertex without intravenous contrast. RADIATION DOSE REDUCTION: This exam was performed according to the departmental dose-optimization program which includes automated exposure control, adjustment of the mA and/or kV according to patient size and/or use of iterative reconstruction technique. COMPARISON:  None Available. FINDINGS: Brain: No midline shift, mass effect, or evidence of intracranial mass lesion. No ventriculomegaly. No acute intracranial hemorrhage identified. Confluent bilateral cerebral white matter hypodensity, including bilateral deep white matter capsule involvement. Basal ganglia and posterior fossa relatively spared. No cortical encephalomalacia identified. No cortically based acute infarct identified. Basal ganglia vascular calcifications are mild. Mass effect on the cervicomedullary junction appears related to chronic/congenital skull base changes described below. Vascular: No suspicious intracranial vascular hyperdensity. Calcified atherosclerosis at the skull base. 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 Some evidence of associated craniocervical and C1-C2 degeneration. No skull fracture, No acute osseous abnormality identified.  Sinuses/Orbits: Tympanic cavities and mastoids are clear. Paranasal sinuses are well aerated with occasional small mucous retention cysts. Other: No acute orbit or scalp soft tissue finding. Partially visible bilateral parotid gland atrophy and sialolithiasis. IMPRESSION: 1. No acute intracranial abnormality. 2. Advanced cerebral white matter changes, most commonly due to chronic small vessel  disease. 3. 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 4. Parotid gland atrophy and sialolithiasis. Electronically Signed   By: Odessa Fleming M.D.   On: 11/23/2022 05:10   DG Chest Port 1 View  Result Date: 11/23/2022 CLINICAL DATA:  Syncope EXAM: PORTABLE CHEST 1 VIEW COMPARISON:  02/10/2018 FINDINGS: The heart size and mediastinal contours are within normal limits. Both lungs are clear. The visualized skeletal structures are unremarkable. IMPRESSION: No active disease. Electronically Signed   By: Sharlet Salina M.D.   On: 11/23/2022 00:23     Time coordinating discharge: Over 30 minutes    Lewie Chamber, MD  [facility] 11/26/2022, 5:09 PM

## 2022-11-26 NOTE — TOC Progression Note (Signed)
Transition of Care [facility]) - Progression Note    Patient Details  Name: Priscilla Daniels MRN: [id number] Date of Birth: 27-Jan-1941  [facility]) CM/SW Contact  Adrian Prows, RN Phone Number: 11/26/2022, 10:20 AM  Clinical Narrative:    Sherron Monday w/ pt's dtr Burnis Kingfisher [phone]); she says hospital bed has not delivered, and she has been contacted by Marcell Anger; spoke w/ Roda Shutters, Hospital Liaison for [facility]; she says bed scheduled to be delivered today at noon; she will notify pt's dtr; Cordelia Pen also verified d/c address [address] [place], [place] [address].       Expected Discharge Plan and Services                                               Social Determinants of Health (SDOH) Interventions SDOH Screenings   Food Insecurity: No Food Insecurity (11/23/2022)  Housing: Low Risk  (11/23/2022)  Transportation Needs: No Transportation Needs (11/23/2022)  Utilities: Not At Risk (11/23/2022)  Alcohol Screen: Low Risk  (10/07/2022)  Depression (PHQ2-9): Low Risk  (10/07/2022)  Financial Resource Strain: Low Risk  (10/07/2022)  Physical Activity: Insufficiently Active (10/07/2022)  Social Connections: Socially Isolated (10/07/2022)  Stress: No Stress Concern Present (10/07/2022)  Tobacco Use: Low Risk  (11/23/2022)    Readmission Risk Interventions     No data to display

## 2022-11-26 NOTE — TOC Transition Note (Signed)
Transition of Care [facility]) - CM/SW Discharge Note   Patient Details  Name: Priscilla Daniels MRN: [id number] Date of Birth: [birth year]  [facility]) CM/SW Contact:  Adrian Prows, RN Phone Number: 11/26/2022, 1:56 PM   Clinical Narrative:    Contacted by pt's dtr and she says DME has been delivered; she says pt will need transport home;d/c orders received; [facility] called at 140; spoke w/ Parme; she was also given pt's dtr contact info; no TOC needs/   Final next level of care: Home w Hospice Care Barriers to Discharge: No Barriers Identified   Patient Goals and CMS Choice      Discharge Placement                         Discharge Plan and Services Additional resources added to the After Visit Summary for                                       Social Determinants of Health (SDOH) Interventions SDOH Screenings   Food Insecurity: No Food Insecurity (11/23/2022)  Housing: Low Risk  (11/23/2022)  Transportation Needs: No Transportation Needs (11/23/2022)  Utilities: Not At Risk (11/23/2022)  Alcohol Screen: Low Risk  (10/07/2022)  Depression (PHQ2-9): Low Risk  (10/07/2022)  Financial Resource Strain: Low Risk  (10/07/2022)  Physical Activity: Insufficiently Active (10/07/2022)  Social Connections: Socially Isolated (10/07/2022)  Stress: No Stress Concern Present (10/07/2022)  Tobacco Use: Low Risk  (11/23/2022)     Readmission Risk Interventions     No data to display

## 2023-01-01 ENCOUNTER — Encounter: Payer: Medicare Other | Admitting: Family Medicine

## 2023-02-02 ENCOUNTER — Encounter (INDEPENDENT_AMBULATORY_CARE_PROVIDER_SITE_OTHER): Payer: Medicare Other | Admitting: Ophthalmology

## 2023-04-23 DEATH — deceased
